# Patient Record
Sex: Female | Born: 1990 | Race: Black or African American | Hispanic: No | Marital: Single | Smoking: Former smoker
Health system: Southern US, Community
[De-identification: ages and names within clinical notes are randomized; demographics above are authoritative.]

## PROBLEM LIST (undated history)

## (undated) ENCOUNTER — Inpatient Hospital Stay (HOSPITAL_COMMUNITY): Payer: Self-pay

## (undated) DIAGNOSIS — Z8619 Personal history of other infectious and parasitic diseases: Secondary | ICD-10-CM

## (undated) DIAGNOSIS — B999 Unspecified infectious disease: Secondary | ICD-10-CM

## (undated) DIAGNOSIS — D573 Sickle-cell trait: Secondary | ICD-10-CM

## (undated) DIAGNOSIS — O139 Gestational [pregnancy-induced] hypertension without significant proteinuria, unspecified trimester: Secondary | ICD-10-CM

## (undated) DIAGNOSIS — R519 Headache, unspecified: Secondary | ICD-10-CM

## (undated) DIAGNOSIS — IMO0002 Reserved for concepts with insufficient information to code with codable children: Secondary | ICD-10-CM

## (undated) DIAGNOSIS — R51 Headache: Secondary | ICD-10-CM

## (undated) DIAGNOSIS — B009 Herpesviral infection, unspecified: Secondary | ICD-10-CM

## (undated) DIAGNOSIS — R87619 Unspecified abnormal cytological findings in specimens from cervix uteri: Secondary | ICD-10-CM

## (undated) HISTORY — DX: Personal history of other infectious and parasitic diseases: Z86.19

## (undated) HISTORY — DX: Unspecified abnormal cytological findings in specimens from cervix uteri: R87.619

## (undated) HISTORY — PX: KNEE SURGERY: SHX244

## (undated) HISTORY — DX: Reserved for concepts with insufficient information to code with codable children: IMO0002

## (undated) HISTORY — DX: Gestational (pregnancy-induced) hypertension without significant proteinuria, unspecified trimester: O13.9

---
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
## 2010-01-04 ENCOUNTER — Emergency Department (HOSPITAL_COMMUNITY): Admission: EM | Admit: 2010-01-04 | Discharge: 2010-01-05 | Payer: Self-pay | Admitting: Emergency Medicine

## 2010-03-16 ENCOUNTER — Emergency Department (HOSPITAL_COMMUNITY): Admission: EM | Admit: 2010-03-16 | Discharge: 2010-03-16 | Payer: Self-pay | Admitting: Emergency Medicine

## 2010-04-06 ENCOUNTER — Emergency Department (HOSPITAL_COMMUNITY): Admission: EM | Admit: 2010-04-06 | Discharge: 2010-04-07 | Payer: Self-pay | Admitting: Emergency Medicine

## 2010-04-26 ENCOUNTER — Ambulatory Visit: Payer: Self-pay | Admitting: Family Medicine

## 2010-04-26 LAB — CONVERTED CEMR LAB: Beta hcg, urine, semiquantitative: NEGATIVE

## 2010-05-05 ENCOUNTER — Inpatient Hospital Stay (HOSPITAL_COMMUNITY): Admission: AD | Admit: 2010-05-05 | Discharge: 2010-05-05 | Payer: Self-pay | Admitting: Obstetrics and Gynecology

## 2010-05-24 ENCOUNTER — Encounter: Payer: Self-pay | Admitting: Family Medicine

## 2010-05-24 ENCOUNTER — Ambulatory Visit: Payer: Self-pay | Admitting: Family Medicine

## 2010-05-24 LAB — CONVERTED CEMR LAB
GC Probe Amp, Genital: NEGATIVE
Whiff Test: NEGATIVE

## 2010-05-25 ENCOUNTER — Telehealth (INDEPENDENT_AMBULATORY_CARE_PROVIDER_SITE_OTHER): Payer: Self-pay | Admitting: Family Medicine

## 2010-05-27 ENCOUNTER — Ambulatory Visit: Payer: Self-pay | Admitting: Family Medicine

## 2010-06-13 ENCOUNTER — Emergency Department (HOSPITAL_COMMUNITY)
Admission: EM | Admit: 2010-06-13 | Discharge: 2010-06-14 | Payer: Self-pay | Source: Home / Self Care | Admitting: Emergency Medicine

## 2010-06-16 ENCOUNTER — Telehealth: Payer: Self-pay | Admitting: Family Medicine

## 2010-06-21 ENCOUNTER — Ambulatory Visit: Payer: Self-pay | Admitting: Family Medicine

## 2010-06-21 LAB — CONVERTED CEMR LAB
Beta hcg, urine, semiquantitative: POSITIVE
Bilirubin Urine: NEGATIVE
Blood in Urine, dipstick: NEGATIVE
Glucose, Urine, Semiquant: NEGATIVE
Protein, U semiquant: NEGATIVE
pH: 5.5

## 2010-06-25 ENCOUNTER — Inpatient Hospital Stay (HOSPITAL_COMMUNITY)
Admission: AD | Admit: 2010-06-25 | Discharge: 2010-06-25 | Payer: Self-pay | Source: Home / Self Care | Attending: Obstetrics & Gynecology | Admitting: Obstetrics & Gynecology

## 2010-06-27 ENCOUNTER — Inpatient Hospital Stay (HOSPITAL_COMMUNITY)
Admission: AD | Admit: 2010-06-27 | Discharge: 2010-06-27 | Payer: Self-pay | Source: Home / Self Care | Attending: Obstetrics and Gynecology | Admitting: Obstetrics and Gynecology

## 2010-06-28 ENCOUNTER — Ambulatory Visit: Payer: Self-pay

## 2010-06-29 ENCOUNTER — Telehealth: Payer: Self-pay | Admitting: Family Medicine

## 2010-06-29 ENCOUNTER — Inpatient Hospital Stay (HOSPITAL_COMMUNITY)
Admission: AD | Admit: 2010-06-29 | Discharge: 2010-06-29 | Payer: Self-pay | Source: Home / Self Care | Attending: Obstetrics & Gynecology | Admitting: Obstetrics & Gynecology

## 2010-06-29 ENCOUNTER — Ambulatory Visit: Payer: Self-pay

## 2010-07-06 ENCOUNTER — Ambulatory Visit: Payer: Self-pay

## 2010-07-14 ENCOUNTER — Ambulatory Visit: Admit: 2010-07-14 | Payer: Self-pay

## 2010-07-19 ENCOUNTER — Ambulatory Visit
Admission: RE | Admit: 2010-07-19 | Discharge: 2010-07-19 | Payer: Self-pay | Source: Home / Self Care | Attending: Family Medicine | Admitting: Family Medicine

## 2010-07-19 ENCOUNTER — Encounter: Payer: Self-pay | Admitting: Family Medicine

## 2010-07-19 LAB — CONVERTED CEMR LAB
Nitrite: NEGATIVE
Specific Gravity, Urine: 1.02
WBC Urine, dipstick: NEGATIVE

## 2010-07-20 ENCOUNTER — Ambulatory Visit
Admission: RE | Admit: 2010-07-20 | Discharge: 2010-07-20 | Payer: Self-pay | Source: Home / Self Care | Attending: Family Medicine | Admitting: Family Medicine

## 2010-07-20 ENCOUNTER — Telehealth: Payer: Self-pay | Admitting: Family Medicine

## 2010-07-20 ENCOUNTER — Encounter: Payer: Self-pay | Admitting: Family Medicine

## 2010-07-20 LAB — CONVERTED CEMR LAB
BUN: 13 mg/dL (ref 6–23)
Calcium: 9.6 mg/dL (ref 8.4–10.5)
Creatinine, Ser: 0.69 mg/dL (ref 0.40–1.20)
Glucose, Bld: 78 mg/dL (ref 70–99)
MCV: 75.5 fL — ABNORMAL LOW (ref 78.0–100.0)
Platelets: 282 10*3/uL (ref 150–400)
Potassium: 4.3 meq/L (ref 3.5–5.3)
TSH: 0.941 microintl units/mL (ref 0.350–4.500)
WBC: 15.5 10*3/uL — ABNORMAL HIGH (ref 4.0–10.5)

## 2010-08-07 ENCOUNTER — Inpatient Hospital Stay (HOSPITAL_COMMUNITY)
Admission: AD | Admit: 2010-08-07 | Discharge: 2010-08-07 | Payer: Self-pay | Source: Home / Self Care | Attending: Obstetrics and Gynecology | Admitting: Obstetrics and Gynecology

## 2010-08-07 LAB — WET PREP, GENITAL
Trich, Wet Prep: NONE SEEN
Yeast Wet Prep HPF POC: NONE SEEN

## 2010-08-07 LAB — URINE MICROSCOPIC-ADD ON

## 2010-08-07 LAB — URINALYSIS, ROUTINE W REFLEX MICROSCOPIC
Bilirubin Urine: NEGATIVE
Hgb urine dipstick: NEGATIVE
Ketones, ur: NEGATIVE mg/dL
Specific Gravity, Urine: >1.03 — ABNORMAL HIGH (ref 1.005–1.030)
Urobilinogen, UA: 0.2 mg/dL (ref 0.0–1.0)

## 2010-08-07 LAB — HCG, QUANTITATIVE, PREGNANCY: hCG, Beta Chain, Quant, S: 12540 m[IU]/mL — ABNORMAL HIGH (ref <5)

## 2010-08-10 ENCOUNTER — Encounter: Payer: Self-pay | Admitting: Family Medicine

## 2010-08-12 NOTE — Progress Notes (Signed)
Summary: triage   Phone Note Call from Patient Call back at [phone]   Caller: Patient Summary of Call: right eye swollen/face hurts/cough Initial call taken by: De Nurse,  August 17, 2009 11:07 AM  Follow-up for Phone Call        started friday. it is not any better. she is going to UC now as we have no more appts left & she does not want to wait until tomorrow Follow-up by: Golden Circle RN,  August 17, 2009 12:22 PM

## 2010-08-12 NOTE — Assessment & Plan Note (Signed)
Summary: decreased hearing and ear pain- acute   Vital Signs:  Patient profile:   20 year old female Weight:      209.8 pounds BMI:     37.30 Temp:     98.7 degrees F oral Pulse rate:   92 / minute Pulse rhythm:   regular BP sitting:   139 / 87  (right arm)  Vitals Entered By: Loralee Pacas CMA (July 15, 2009 8:43 AM) CC: decreased hearing and pain    Primary Care Provider:  Lequita Asal  MD  CC:  decreased hearing and pain.  History of Present Illness: 20yo F c/o decreased hearing b/l and pain.  Decreased hearing/pain: Left ear x 3 days.  Right ear x 1 day.  Denies any trauma, head injury, fevers, tinnitis, ear discharge.  Has had rhinorrhea and congestion recently.  Denies any previous history of cerumen impaction.   Allergies: No Known Drug Allergies  Past History:  Past Medical History: Last updated: 03/03/2008 G1P1001 (NSVD 03/03/08, Reather Littler, no complications) fungal vaginitis 11/03, 7/07 Recurrent Chlamydia during pregnancy ' 08  Past Surgical History: Last updated: 09/07/2006 adenoidectomy - 04/11/1999  Review of Systems       Denies any trauma, head injury, fevers, tinnitis, ear discharge.  Has had rhinorrhea and congestion recently.  Physical Exam  General:  VS reviewed.  Obese, well appearing, NAD Head:  no sinus ttp atraumatic.   Eyes:  no injected conjunctiva Ears:  gross hearing intact canal clear mild erythema surrounding TM b/l no bulging, drainage, or pus Nose:  no discharge Mouth:  moist mucus membranes Neurologic:  abnl weber test- louder on the left ear   Impression & Recommendations:  Problem # 1:  DECREASED HEARING (ICD-389.9) Assessment New L ear x 3 days with abnl weber test.  Low suspicion for acoustic neuroma.  Pt discussed with Dr. Swaziland.  Suspect that this could be related to recent URI with congestion and rhinorrhea.  Although I could not see anything behind the ear drum, there could possibly be fluid present  causing her symptoms.  Plan to treat with saline nasal spray and ibuprofen to help with some mild inflammation surrounding the TM.  No obvious signs of infection both on exam and hx.  Will reassess in 1 week.  At that time, will recheck audiometry and possibly start on nasal steroid to open up eustachian tubes.  If no improvement after that intervention or if symptoms worsening, will send to [facility].   Orders: [facility]- Est  Level 4 ([address]109)  Complete Medication List: 1)  Nuvaring 0.12-0.015 Mg/24hr Ring (Etonogestrel-ethinyl estradiol) .... Insert into vagina x 3 weeks, remove for 1 week, then replace with new - disp 1 device 2)  Ibuprofen 600 Mg Tabs (Ibuprofen) .Marland Kitchen.. 1 tablet by mouth every 6 hours x 3 days then every 6 hours as needed for pain 3)  Saline Nasal Spray 0.65 % Soln (Saline) .Marland Kitchen.. 1 spray each nostril two times a day  Patient Instructions: 1)  Schedule follow up appt with Dr. Burnadette Pop in 1 week to reassess hearing.   2)  I think this is related to inflammation around the ear drum and also to your sinusitis.  I want you to pick up nasal saline spray at the pharmacy.  And take 600mg  of ibuprofen every 6 hours x 3 days and then as needed.  Prescriptions: IBUPROFEN 600 MG TABS (IBUPROFEN) 1 tablet by mouth every 6 hours x 3 days then every 6 hours as needed  for pain  #30 x 0   Entered and Authorized by:   Marisue Ivan  MD   Signed by:   Marisue Ivan  MD on 07/15/2009   Method used:   Electronically to        [facility] [phone]* (retail)       [address]       [place], [place]  [address]       Ph: [phone]       Fax: [phone]   RxID:   [id number]

## 2010-08-12 NOTE — Miscellaneous (Signed)
   Clinical Lists Changes  Problems: Removed problem of DECREASED HEARING (ICD-389.9) Removed problem of CELLULITIS, FACE, LEFT (ICD-682.0) Removed problem of CHLAMYDTRACHOMATIS INFECTION LOWER GU SITES (ICD-099.53) Removed problem of ABDOMINAL PAIN, UNSPECIFIED SITE (ICD-789.00) Removed problem of AMENORRHEA (ICD-626.0) Removed problem of HEADACHE (ICD-784.0) Removed problem of RASH AND OTHER NONSPECIFIC SKIN ERUPTION (ICD-782.1) Removed problem of CONTRACEPTIVE MANAGEMENT (ICD-V25.09) Removed problem of CONTACT OR EXPOSURE TO OTHER VIRAL DISEASES (ICD-V01.79) Removed problem of SCREENING FOR MALIGNANT NEOPLASM OF THE CERVIX (ICD-V76.2) Medications: Removed medication of NUVARING 0.12-0.015 MG/24HR RING (ETONOGESTREL-ETHINYL ESTRADIOL) Insert into vagina x 3 weeks, remove for 1 week, then replace with new - disp 1 device Removed medication of IBUPROFEN 600 MG TABS (IBUPROFEN) 1 tablet by mouth every 6 hours x 3 days then every 6 hours as needed for pain Removed medication of SALINE NASAL SPRAY 0.65 % SOLN (SALINE) 1 spray each nostril two times a day

## 2010-08-12 NOTE — Assessment & Plan Note (Signed)
Summary: f/u miscarriage/pt in pain/eo   Vital Signs:  Patient profile:   20 year old female Weight:      226 pounds Pulse rate:   98 / minute BP sitting:   147 / 98  (right arm)  Vitals Entered By: Arlyss Repress CMA, (July 19, 2010 3:02 PM) CC: f/up miscarriage. [facility] Is Patient Diabetic? No Pain Assessment Patient in pain? no        Primary Care Provider:  . [facility]  CC:  f/up miscarriage. [facility].  History of Present Illness: [name] presents to clinic today to follow up a misscarrage at [facility]. She was around 2-3 weeks in her pregnany. She was managed with serial Bhcgs at [facility]. Last value was 7 on Dec 20th. In interum she has stoped bleeding and feels well. She has been having unprotected sex. She did take a urine pregnancy test a few days ago and it was positive.   Additionally she has urinary frequency and left quadrent lower abdominal pain that is mild.  Frequency is not associated with burning or discharge. No fevers chills or back pain. Feels well otherwise.   Abdominal pain: Mild not associated with BMs or urine. Will occasionally notice it. Not very bothersome.   Current Problems (verified): 1)  Urinary Frequency  (ICD-788.41) 2)  Pregnancy, Multigravida  (ICD-V22.1) 3)  Obesity  (ICD-278.00) 4)  Elevated Blood Pressure Without Diagnosis of Hypertension  (ICD-796.2) 5)  Hemorrhoids Nos, w/o Complications  (ICD-455.6) 6)  Eczema, Atopic Dermatitis  (ICD-691.8)  Current Medications (verified): 1)  Prenatal Vitamins 0.8 Mg Tabs (Prenatal Multivit-Min-Fe-Fa) .Marland Kitchen.. 1 By Mouth Daily For Pregnancy  Allergies (verified): No Known Drug Allergies  Past History:  Past Surgical History: Last updated: 09/07/2006 adenoidectomy - 04/11/1999  Family History: Last updated: 01/29/2008 brother - asthma,  mother - asthma,  sisters - asthma  Social History: Last updated: 01/07/2009 Teen mom Arnell Asal), lives with grandmother, [name] lives in [place].  3 siblings - 2  sisters (kristine and charity) and 1 brother Probation officer. Mother dx with HIV 10/06, pt doesn't know; sexually active since age 44; Recurrent Chlamydia with pregnancy '08 treated for Chlamydia 10/2008  Risk Factors: Smoking Status: quit (05/27/2010)  Past Medical History: G2P1011 (NSVD 03/03/08, Reather Littler, no complications) fungal vaginitis 11/03, 7/07 Recurrent Chlamydia during pregnancy ' 08  Review of Systems       The patient complains of abdominal pain.  The patient denies anorexia, fever, weight loss, chest pain, syncope, hematochezia, severe indigestion/heartburn, genital sores, unusual weight change, abnormal bleeding, and enlarged lymph nodes.    Physical Exam  General:  NAD, obese, Vital signs noted pleasant Mouth:  MMM Lungs:  normal WOB, CTABL Heart:  Normal rate and regular rhythm. S1 and S2 normal without gallop, murmur, click, rub or other extra sounds. Abdomen:  Non distended, NABS, soft, no guarding or rebound.  Very mildly TTP in left lower quadrent.  Extremities:  non edemetus BL LE   Impression & Recommendations:  Problem # 1:  PREGNANCY, MULTIGRAVIDA (ICD-V22.1) Based on home pregnancy test. I am not sure if this home test is a reflection of a lingering BHCG from previous pregnancy or a new pregnancy.  I will obtain a blood quantative BHCG and follow the triend. If elevated will assume pregnancy and offer prenatal care vs abortion.  Will follow up via phone when test back.  New number is [phone]  Orders: Miscellaneous Lab Charge-[facility] [phone]) [facility]- Est Level  3 [phone])  Problem # 2:  URINARY FREQUENCY (ICD-788.41) Assessment: New UA is clean today. Will follow up via phone.  Orders: Urinalysis-[facility] (00000) [facility]- Est Level  3 (91478)  Complete Medication List: 1)  Prenatal Vitamins 0.8 Mg Tabs (Prenatal multivit-min-fe-fa) .Marland Kitchen.. 1 by mouth daily for pregnancy  Patient Instructions: 1)  Thank you for seeing me today.   Orders Added: 1)  Urinalysis-[facility]  [00000] 2)  Miscellaneous Lab Charge-[facility] [99999] 3)  [facility]- Est Level  3 [99213]    Laboratory Results   Urine Tests  Date/Time Received: July 19, 2010 3:21 PM  Date/Time Reported: July 19, 2010 3:37 PM   Routine Urinalysis   Color: yellow Appearance: Clear Glucose: negative   (Normal Range: Negative) Bilirubin: negative   (Normal Range: Negative) Ketone: negative   (Normal Range: Negative) Spec. Gravity: 1.020   (Normal Range: 1.003-1.035) Blood: negative   (Normal Range: Negative) pH: 5.5   (Normal Range: 5.0-8.0) Protein: negative   (Normal Range: Negative) Urobilinogen: 0.2   (Normal Range: 0-1) Nitrite: negative   (Normal Range: Negative) Leukocyte Esterace: negative   (Normal Range: Negative)    Comments: ...............test performed by......Marland KitchenBonnie A. Swaziland, MLS (ASCP)cm

## 2010-08-12 NOTE — Procedures (Signed)
Summary: decreased hearing and ear pain- acute      20db HL: Left  500 hz: No Response 1000 hz: No Response 2000 hz: 20db 4000 hz: 20db Right  500 hz: No Response 1000 hz: No Response 2000 hz: 20db 4000 hz: 20db    Allergies: No Known Drug Allergies   Complete Medication List: 1)  Nuvaring 0.12-0.015 Mg/24hr Ring (Etonogestrel-ethinyl estradiol) .... Insert into vagina x 3 weeks, remove for 1 week, then replace with new - disp 1 device 2)  Ibuprofen 600 Mg Tabs (Ibuprofen) .Marland Kitchen.. 1 tablet by mouth every 6 hours x 3 days then every 6 hours as needed for pain 3)  Saline Nasal Spray 0.65 % Soln (Saline) .Marland Kitchen.. 1 spray each nostril two times a day

## 2010-08-12 NOTE — Progress Notes (Signed)
Summary: Triage   Phone Note Call from Patient Call back at Home Phone [phone]   Reason for Call: Talk to Nurse Summary of Call: feeling chest pain, cant breathe good, was seen yesterday for other issues.  Initial call taken by: Knox Royalty,  July 20, 2010 1:34 PM  Follow-up for Phone Call        admits to anxiety. asked her to come in now. has appt at 2:30. we can get vitals & talk further. told her we will try to get her seen asap. she agreed pain is in center of chest. states deep breathing helps Follow-up by: Golden Circle RN,  July 20, 2010 1:47 PM

## 2010-08-12 NOTE — Assessment & Plan Note (Signed)
Summary: chest pressure/sob/bmc   Vital Signs:  Patient profile:   20 year old female Height:      63 inches Weight:      225 pounds BMI:     40.00 Temp:     98.1 degrees F oral Pulse rate:   93 / minute BP sitting:   128 / 89  (left arm) Cuff size:   large  Vitals Entered By: Tessie Fass CMA (July 20, 2010 2:49 PM) CC: chest pressure, SOB Is Patient Diabetic? No Pain Assessment Patient in pain? yes     Location: lower back, abdomen Intensity: 8   Primary Care Provider:  Barnabas Lister  MD  CC:  chest pressure and SOB.  History of Present Illness: 20 y/o F with obesity and elevated BP without Dx of HTN woke up with chest pressure this morning.  She is an occasional smoker x 1 yr.  Smokes about 1 day per month.  She is also worried about her miscarriage.  She has been trying to get pregnant and had two home pregnancy tests with Positive result and one with a negative result.  She was seen in clinic yesterday for bHCG test, but has not received result yet.    CHEST PAIN Location: substernal Description: constant chest pain that feels like pressure and something heavy sitting on her chest Onset: Pt woke up this morning with feeling of heaviness in her chest.  She thinks that it has improved mildly, but is still present.  This pain is graded at an 8 out of 10.  She has not taken tylenol or aspirin.  No radiation to neck, jaw, or arm.  No previous episodes of chest pain.   Symptoms Trauma: no Nausea/vomiting: +nausea Diaphoresis: yes, feels hot x 2 days Shortness of breath: yes, feels like she has to catch her breath or take a deep breath Cough: no Edema: no, but thighs are painful Orthopnea: no Syncope: no Indigestion: no  Red Flags Worse with exertion: yes Recent Immobility: no Cancer history: no Tearing/radiation to back: no    Habits & Providers  Alcohol-Tobacco-Diet     Tobacco Status: current     Tobacco Counseling: to quit use of tobacco products   Cigarette Packs/Day: <0.25  Current Medications (verified): 1)  Prenatal Vitamins 0.8 Mg Tabs (Prenatal Multivit-Min-Fe-Fa) .Marland Kitchen.. 1 By Mouth Daily For Pregnancy  Allergies (verified): No Known Drug Allergies  Social History: Smoking Status:  current Packs/Day:  <0.25  Review of Systems       per hpi   Physical Exam  General:  Well-developed,well-nourished,in no acute distress; alert,appropriate and cooperative throughout examination. Vitals reviewed.  Head:  normocephalic and atraumatic.   Mouth:  Oral mucosa and oropharynx without lesions or exudates.  Teeth in good repair. Lungs:  Normal respiratory effort, chest expands symmetrically. Lungs are clear to auscultation, no crackles or wheezes. Heart:  Normal rate and regular rhythm. S1 and S2 normal without gallop, murmur, click, rub or other extra sounds.  No bruit  Abdomen:  Bowel sounds positive,abdomen soft and non-tender without masses, organomegaly or hernias noted. obese.  Msk:  No deformity or scoliosis noted of thoracic or lumbar spine.   Pulses:  R and L carotid,radial,femoral,dorsalis pedis and posterior tibial pulses are full and equal bilaterally Extremities:  No clubbing, cyanosis, edema, or deformity noted with normal full range of motion of all joints.   Neurologic:  alert & oriented X3 and cranial nerves II-XII intact.   Cervical Nodes:  No  lymphadenopathy noted   Impression & Recommendations:  Problem # 1:  CHEST PAIN (ICD-786.50) Assessment New Complaint of chest pain in an obese 20 y/o F with no other risk factors.  EKG showed NSR.  Likely not cardiac in etiolgoy.  May be from anxiety regarding her pregnancy.  Pt is obese and has had HR in 90s.  This may also be from deconditioning, but may be from anemia or thyroid dysfunction.  Will check these labs today.   EKG: NSR, HR 84, No abnormal ST-wave changes  Orders: Basic Met-[facility] (54098-11914) CBC-[facility] (78295) TSH-[facility] (62130-86578) EKG- [facility] (EKG) [facility]- Est  Level  3 (46962)  Problem # 2:  DYSPNEA (ICD-786.05) Assessment: New See above.  Dyspnea may be from deconditioning or anxiety/stress regarding SAB.  Pt morbidly obese and endorses daytime sleepiness, but does not have symptoms of snoring or losing breath in sleep, so likely not OSA.   Orders: TSH-[facility] ([id number]) [facility]- Est Level  3 (01027)  Problem # 3:  PREGNANCY, MULTIGRAVIDA (ICD-V22.1) Assessment: Comment Only Gave result of bHCG, likely not pregnant given that it is <2.  Pt took news well, although she is disappointed.    Complete Medication List: 1)  Prenatal Vitamins 0.8 Mg Tabs (Prenatal multivit-min-fe-fa) .Marland Kitchen.. 1 by mouth daily for pregnancy  Patient Instructions: 1)  Please schedule a follow-up appointment with Dr Tye Savoy to follow up these labs and ways to get healthy. 2)  Your EKG today is normal.  This is very reassuring. 3)  There are many reasons for your symptoms, and it may be that you were stress or anxious or deconditioned.   4)  Tobacco is very bad for your health and your loved ones ! You should stop smoking !  5)  Stop smoking tips: Choose a quit date. Cut down before the quit date. Decide what you will do as a substitute when you feel the urge to smoke(gum, toothpick, exercise).    Orders Added: 1)  Basic Met-[facility] [25366-44034] 2)  CBC-[facility] [85027] 3)  TSH-[facility] [74259-56387] 4)  EKG- [facility] [EKG] 5)  [facility]- Est Level  3 [56433]

## 2010-08-12 NOTE — Assessment & Plan Note (Signed)
Summary: uri x 1 wk/bmc   Vital Signs:  Patient profile:   20 year old female LMP:     03/15/2010 Weight:      221 pounds Temp:     98 degrees F oral Pulse rate:   90 / minute BP sitting:   136 / 85  (left arm) Cuff size:   large  Vitals Entered By: Loralee Pacas CMA (April 26, 2010 3:54 PM) Comments pt stated that she vomitted yesterday and it looked like it had blood in it.  she has been coughing alot which has caused her chest to hurt, no SOB. sharp pains in her stomach, pain eases somewhat when she goes to bed. she is now experiencing body aches LMP (date): 03/15/2010     Enter LMP: 03/15/2010 Last PAP Result NEGATIVE FOR INTRAEPITHELIAL LESIONS OR MALIGNANCY.   Primary Care Provider:  . [facility]   History of Present Illness: 20 yo here for 1 week of non productive cough, rhinorrhea, mild body aches, sore throat.  Had post-tussive emesis- thought she saw blood.  had not eaten red food prior.  no ear pain, fever.  contraception:  ran out of nuva ring, did not go back to refill prescription.  period is late this month.  Had done well on nuva ring, woudl like refill  Habits & Providers  Alcohol-Tobacco-Diet     Tobacco Status: never  Current Medications (verified): 1)  Nuvaring 0.12-0.015 Mg/24hr Ring (Etonogestrel-Ethinyl Estradiol) .... Insert One Ring For 3 Weeks, Remove X 1 Weeks, Then Replace.  Dispense 1 Month Supply  Allergies (verified): No Known Drug Allergies PMH-FH-SH reviewed for relevance  Review of Systems      See HPI  Physical Exam  General:  Well-developed,well-nourished,in no acute distress; alert,appropriate and cooperative throughout examination Nose:  Clear without Rhinorrhea Lungs:  Normal respiratory effort, chest expands symmetrically. Lungs are clear to auscultation, no crackles or wheezes. Heart:  Normal rate and regular rhythm. S1 and S2 normal without gallop, murmur, click, rub or other extra sounds. Abdomen:  Bowel sounds  positive,abdomen soft and non-tender without masses, organomegaly or hernias noted.   Impression & Recommendations:  Problem # 1:  VIRAL URI (ICD-465.9)  advised motrin or aleve for muscle aches.  Told to return if no better in a week, starts to run fevers, or had further episodes of what she thinks is bloody emesis.  Orders: [facility]- Est Level  3 (16109)  Problem # 2:  CONTRACEPTIVE MANAGEMENT (ICD-V25.09)  will check upreg today for late menses.  Will refill nuvaring.  Counseled on condoms always, STD's.  Orders: [facility]- Est Level  3 (60454)  Complete Medication List: 1)  Nuvaring 0.12-0.015 Mg/24hr Ring (Etonogestrel-ethinyl estradiol) .... Insert one ring for 3 weeks, remove x 1 weeks, then replace.  dispense 1 month supply  Other Orders: U Preg-[facility] (09811) Prescriptions: NUVARING 0.12-0.015 MG/24HR RING (ETONOGESTREL-ETHINYL ESTRADIOL) insert one ring for 3 weeks, remove x 1 weeks, then replace.  Dispense 1 month supply  #1 x 5   Entered and Authorized by:   Delbert Harness MD   Signed by:   Delbert Harness MD on 04/26/2010   Method used:   Electronically to        [facility] [address] [phone]* (retail)       [address]       [place], [place]  [address]       Ph: [phone]       Fax: [phone]   RxID:   [id number]  Orders Added: 1)  U Preg-[facility] [81025] 2)  [facility]- Est Level  3 [99213]    Laboratory Results   Urine Tests  Date/Time Received: April 26, 2010 4:45 PM  Date/Time Reported: April 26, 2010 4:52 PM     Urine HCG: negative Comments: ...............test performed by......Marland KitchenBonnie A. Swaziland, MLS (ASCP)cm

## 2010-08-12 NOTE — Assessment & Plan Note (Signed)
Summary: EAR/KH   Allergies: No Known Drug Allergies   Complete Medication List: 1)  Nuvaring 0.12-0.015 Mg/24hr Ring (Etonogestrel-ethinyl estradiol) .... Insert into vagina x 3 weeks, remove for 1 week, then replace with new - disp 1 device  Other Orders: No Charge Patient Arrived (NCPA0) (NCPA0)

## 2010-08-12 NOTE — Assessment & Plan Note (Signed)
Summary: vaginal bleeding frequent/[facility]   Vital Signs:  Patient profile:   20 year old female LMP:     05/23/2010 Height:      63 inches Weight:      220 pounds BMI:     39.11 Temp:     98.4 degrees F oral Pulse rate:   89 / minute BP sitting:   134 / 92  Vitals Entered By: Jimmy Footman, CMA (June 21, 2010 4:09 PM) CC: positive preg test, abdominal pain Is Patient Diabetic? No LMP (date): 05/23/2010 EDC by LMP==> 02/27/2011 EDC 02/27/2011     Enter LMP: 05/23/2010 Last PAP Result NEGATIVE FOR INTRAEPITHELIAL LESIONS OR MALIGNANCY.   Primary Care Provider:  . [facility]  CC:  positive preg test and abdominal pain.  History of Present Illness:   2 weeks ago, had light bleeding for a few hours, sexually active last monday   LMP- November 13th,  occurs once a month, bleeds for 7 days slight thin discharge, not bothersome,   +abd pain feels like menstral cramps that come and go, no dysuria, +nausea, able to eat, no diarrhea  Has not been on Nuva ring for a few months  Sexually active 1 partner Neg STD check in Nov 2011   Current Medications (verified): 1)  Prenatal Vitamins 0.8 Mg Tabs (Prenatal Multivit-Min-Fe-Fa) .Marland Kitchen.. 1 By Mouth Daily For Pregnancy  Allergies (verified): No Known Drug Allergies  Review of Systems       Per HPI  Physical Exam  General:  NAD, obese, Vital signs noted pleasant Mouth:  MMM Lungs:  normal WOB Abdomen:  soft, non-tender, normal bowel sounds, no distention, and no masses.  no suprapubic pressure no CVA tenderness   Impression & Recommendations:  Problem # 1:  PREGNANCY, MULTIGRAVIDA (ICD-V22.1) Assessment New  Positive u preg, pt was very excited and suprised about pregnancy, felt safe telling her grandmother and [name]. Of note not using Nuva Ring Given script for PNV if pt can not tolerate, send Folic Acid tabs or try flinestones,  Patient to set up new OB appt. Since discharge not bothersone will not treat  for BV Orders: [facility]- Est Level  3 ([address])  Complete Medication List: 1)  Prenatal Vitamins 0.8 Mg Tabs (Prenatal multivit-min-fe-fa) .Marland Kitchen.. 1 by mouth daily for pregnancy  Other Orders: Urinalysis-[facility] (00000) U Preg-[facility] (60454)  Patient Instructions: 1)  You have a positive pregnancy test  2)  Call to schedule a New OB appt 3)  Congratulations on your new pregnancy! 4)  If you have any concerns please feel free to call. 5)  If you have severe abominal pain or bleeding please go to [facility] to be evaluated 6)  Please take your vitamins as prescribed and drink plenty of water 7)    Prescriptions: PRENATAL VITAMINS 0.8 MG TABS (PRENATAL MULTIVIT-MIN-FE-FA) 1 by mouth daily for pregnancy  #30 x 12   Entered and Authorized by:   Milinda Antis MD   Signed by:   Milinda Antis MD on 06/21/2010   Method used:   Electronically to        [facility] [address] [address] [phone]* (retail)       [address]       [place], [place]  [address]       Ph: [phone]       Fax: [phone]   RxID:   [id number]    Orders Added: 1)  Urinalysis-[facility] [00000] 2)  U Preg-[facility] [81025] 3)  [facility]- Est Level  3 [04540]    Laboratory Results   Urine Tests  Date/Time Received: June 21, 2010 4:14 PM  Date/Time Reported: June 21, 2010 4:27 PM   Routine Urinalysis   Color: yellow Appearance: Clear Glucose: negative   (Normal Range: Negative) Bilirubin: negative   (Normal Range: Negative) Ketone: negative   (Normal Range: Negative) Spec. Gravity: 1.025   (Normal Range: 1.003-1.035) Blood: negative   (Normal Range: Negative) pH: 5.5   (Normal Range: 5.0-8.0) Protein: negative   (Normal Range: Negative) Urobilinogen: 0.2   (Normal Range: 0-1) Nitrite: negative   (Normal Range: Negative) Leukocyte Esterace: trace   (Normal Range: Negative)  Urine Microscopic WBC/HPF: 1-5 Bacteria/HPF: 3+ cocci Epithelial/HPF: 10-20  Other: several clue cells    Urine HCG: positive Comments:  ...............test performed by......Marland KitchenBonnie A. [place], MLS (ASCP)cm       Flowsheet View for Follow-up Visit    Estimated weeks of       gestation:     4 1/7    Weight:     220    Blood pressure:   134 / 92    Urine protein:       negative    Urine glucose:    negative    Urine nitrite:     negative  Prenatal Visit EDC Confirmation:    New working [facility]: 02/27/2011    Last menses onset (LMP) date: 05/23/2010    EDC by LMP: 02/27/2011    Flowsheet View for Follow-up Visit    Estimated weeks of       gestation:     4 1/7    Weight:     220    Blood pressure:   134 / 92    Urine Protein:     negative    Urine Glucose:   negative    Urine Nitrite:     negative

## 2010-08-12 NOTE — Progress Notes (Signed)
Summary: re: labs/TS   ---- Converted from flag ---- ---- 05/25/2010 7:45 AM, Delbert Harness MD wrote: would you please call pt and let her  kn ow gnorrhea, chlamydia, syphilis, and hiv wee negative.  tell her i sent in flagyl to her pharmacy to treat BV vaginal discharge-  not an STD ------------------------------ called pt x 2 ... number busy .Arlyss Repress CMA,  May 25, 2010 6:02 PM  Phone Note Outgoing Call Call back at [phone]   Call placed by: Jimmy Footman, CMA,  May 26, 2010 9:02 AM Call placed to: Patient Summary of Call: lvm for pt to call [facility]ck to give results  Follow-up for Phone Call        gave message to patient.  was appreciative Follow-up by: De Nurse,  May 27, 2010 8:33 AM

## 2010-08-12 NOTE — Progress Notes (Signed)
Summary: phn msg  I will try calling patient myself when I back from vacation.  Please let me know if there is a better number to reach her.  If not, I will send a letter.  Thanks.   Phone Note Call from Patient Call back at [phone]   Caller: Patient Summary of Call: has been going to [facility] for bleeding and is wondering if they would tell her if she is having a miscarriage. Initial call taken by: De Nurse,  June 29, 2010 2:24 PM  Follow-up for Phone Call        attempted calling back but message states " person you are calling has a voice mailbox that has not been set up. " Follow-up by: Theresia Lo RN,  June 29, 2010 2:57 PM  Additional Follow-up for Phone Call Additional follow up Details #1::        patient calls back stating she is currently at [facility] waiting to be seen. they have recently done labs on her and her pregnancy test continues to be positive she states. advised while she is there to ask questions  that she has about a possible miscarriage. will forward message to Dr. d Lawson Radar Additional Follow-up by: Theresia Lo RN,  June 29, 2010 3:23 PM

## 2010-08-12 NOTE — Assessment & Plan Note (Signed)
Summary: std?,df   Vital Signs:  Patient profile:   20 year old female Weight:      222.9 pounds BMI:     39.63 Temp:     98.8 degrees F oral Pulse rate:   91 / minute BP sitting:   132 / 86  (left arm) Cuff size:   large  Vitals Entered By: Jimmy Footman, CMA (May 24, 2010 3:25 PM) CC: discharge x3 days Is Patient Diabetic? No Pain Assessment Patient in pain? no        Primary Care Provider:  . [facility]  CC:  discharge x3 days.  History of Present Illness: 20 yo here for 3 day history of vaginal discharge.  -Thin, white, no odor. - no dysuria, fever, pelvic pain, n/v.  Is currenlty on her menses.  Had unprotected intercourse. - history of chlamydia nfection in the past - nuva ring for conrtaception  Habits & Providers  Alcohol-Tobacco-Diet     Tobacco Status: quit  Current Medications (verified): 1)  Nuvaring 0.12-0.015 Mg/24hr Ring (Etonogestrel-Ethinyl Estradiol) .... Insert One Ring For 3 Weeks, Remove X 1 Weeks, Then Replace.  Dispense 1 Month Supply  Allergies: No Known Drug Allergies PMH-FH-SH reviewed for relevance  Social History: Smoking Status:  quit  Review of Systems      See HPI  Physical Exam  General:  Well-developed,well-nourished,in no acute distress; alert,appropriate and cooperative throughout examination Genitalia:  Pelvic Exam:        External: normal female genitalia without lesions or masses        Vagina: normal without lesions or masses        Cervix: normal without lesions or masses.  bleeding from cervical os.        Adnexa: normal bimanual exam without masses or fullness        Uterus: normal by palpation        Pap smear: not performed   Impression & Recommendations:  Problem # 1:  UNSPECIFIED VAGINITIS AND VULVOVAGINITIS (ICD-616.10)  Will await results before treatment.  Given risk factors will check HIV and RPR today  Orders: GC/Chlamydia-[facility] (87591/87491) Wet Prep- [facility] (11914) [facility]- Est Level  3  (78295)  Her updated medication list for this problem includes:    Metronidazole 500 Mg Tabs (Metronidazole) .Marland Kitchen... Take one tablet twice a day for 7 days  Problem # 2:  VAGINITIS, BACTERIAL (ICD-616.10)  Will treat BV once results of GC/Chlamyida back.  Orders: [facility]- Est Level  3 (62130)  Her updated medication list for this problem includes:    Metronidazole 500 Mg Tabs (Metronidazole) .Marland Kitchen... Take one tablet twice a day for 7 days  Complete Medication List: 1)  Nuvaring 0.12-0.015 Mg/24hr Ring (Etonogestrel-ethinyl estradiol) .... Insert one ring for 3 weeks, remove x 1 weeks, then replace.  dispense 1 month supply 2)  Metronidazole 500 Mg Tabs (Metronidazole) .... Take one tablet twice a day for 7 days  Other Orders: HIV-[facility] ([id number]) RPR-[facility] [phone])  Patient Instructions: 1)  use condoms always 2)  I will send you letetr with normal results, otherwise I will call Prescriptions: METRONIDAZOLE 500 MG TABS (METRONIDAZOLE) take one tablet twice a day for 7 days  #14 x 0   Entered and Authorized by:   Delbert Harness MD   Signed by:   Delbert Harness MD on 05/25/2010   Method used:   Electronically to        [facility] [address] [phone]* (retail)       [phone]       [place], [place]  [address]       Ph: [phone]       Fax: [phone]   RxID:   [id number]    Orders Added: 1)  GC/Chlamydia-[facility] [87591/87491] 2)  Wet Prep- [facility] [87210] 3)  HIV-[facility] [84132-44010] 4)  RPR-[facility] [27253-66440] 5)  [facility]- Est Level  3 [34742]    Laboratory Results   Urine Tests  Date/Time Received:  Date/Time Reported:     Date/Time Received: May 24, 2010 3:37 PM  Date/Time Reported: May 24, 2010 3:49 PM   Wet Carbon Source: vag WBC/hpf: 1-5 Bacteria/hpf: 2+  Cocci Clue cells/hpf: few  Negative whiff Yeast/hpf: none Trichomonas/hpf: none Comments: loaded with RBC's ...............test performed by......Marland KitchenBonnie A. [place], MLS (ASCP)cm

## 2010-08-12 NOTE — Progress Notes (Signed)
Summary: phn msg  I agree with this plan.  If a period is skipped or if she is having fever or signs of UTI, patient may need to come in for evaluation.  Phone Note Call from Patient Call back at [phone]   Caller: Patient Summary of Call: pt has a question about her periods Initial call taken by: De Nurse,  June 16, 2010 3:41 PM  Follow-up for Phone Call        patient states  her periods are irregular anyway. her last period was 05/23/2010. today she had some bleeding for 2 hours very light, not even requiring a panty liner. then the bleeding just stopped. this is the first time this has happened.  advised patient to just watch pattern for now. If regular period is not coming as expected or if episodes such as this continue to let us know. will send message to MD for further suggestions.  Follow-up by: Theresia Lo RN,  June 16, 2010 3:49 PM

## 2010-08-12 NOTE — Assessment & Plan Note (Signed)
Summary: discuss infertility/eo  [id number]  Vital Signs:  Patient profile:   20 year old female Weight:      218 pounds Pulse rate:   82 / minute BP sitting:   140 / 90  (right arm)  Vitals Entered By: Arlyss Repress CMA, (May 27, 2010 1:49 PM) CC: discuss fertility. pt is not using birth control. has 47 year old son Is Patient Diabetic? No Pain Assessment Patient in pain? no        Primary Shevy Yaney:  . [facility]  CC:  discuss fertility. pt is not using birth control. has 85 year old son.  History of Present Illness: 20 year old female wants to discuss infertility.  She removed Nuvaring last month b/c she thought she wanted to have another baby.  She has one 20  yo son.  Pt is concerned that she might be infertile.  While she was pregnant with her first child, she was treated for chlamydia.  Nurse told her that recurrent chlamydia infections can damage her "insides" and cause infertility.  Pt wants to know if this is true.  Pt has changed her mind and no longer wants to try to get pregnant.  She will be using Nuvaring shortly.  ROS: Positive for vaginal discharge.  Denies fever, pelvic or abdominal pain, spotting/bleeding, burning, or foul odor.  Preventive Screening-Counseling & Management  Alcohol-Tobacco     Smoking Status: quit  Current Medications (verified): 1)  Nuvaring 0.12-0.015 Mg/24hr Ring (Etonogestrel-Ethinyl Estradiol) .... Insert One Ring For 3 Weeks, Remove X 1 Weeks, Then Replace.  Dispense 1 Month Supply 2)  Metronidazole 500 Mg Tabs (Metronidazole) .... Take One Tablet Twice A Day For 7 Days  Allergies (verified): No Known Drug Allergies  Review of Systems       per HPI  Physical Exam  General:  normal appearance, healthy appearing, and obese.   Psych:  anxious, but alert and cooperative   Impression & Recommendations:  Problem # 1:  CONTRACEPTIVE MANAGEMENT (ICD-V25.09) Reassured patient that she does not meet the criteria for  infertility.  An infertility work-up will be considered if she cannot conceive after 12 months (after removing nuvaring).  Because she has a child, is young, and has been treated for chlaymdia, it is unlikely that her tubes are damaged.  Advised pt to try to have intercourse every other day, 5 days before and after ovulation.  However, pt says her cyles are irregular.  She no longer wants to get pregnant and will be using Nuvaring again shortly.  Advised pt to call MD if she cannot conceive after 12 months of trying.  She seemed reassured, but disappointed.    Orders: [facility]- Est Level  3 (62831)   Orders Added: 1)  [facility]- Est Level  3 [51761]

## 2010-08-26 NOTE — Miscellaneous (Signed)
Summary: ROI  ROI   Imported By: De Nurse 08/10/2010 19:18:52  _____________________________________________________________________  External Attachment:    Type:   Image     Comment:   External Document

## 2010-08-27 LAB — HEPATITIS B SURFACE ANTIGEN: Hepatitis B Surface Ag: NEGATIVE

## 2010-09-20 LAB — CBC
HCT: 36.8 % (ref 36.0–46.0)
MCV: 74.5 fL — ABNORMAL LOW (ref 78.0–100.0)
RBC: 4.94 MIL/uL (ref 3.87–5.11)
RDW: 14.3 % (ref 11.5–15.5)
WBC: 10.5 10*3/uL (ref 4.0–10.5)

## 2010-09-20 LAB — URINALYSIS, ROUTINE W REFLEX MICROSCOPIC
Bilirubin Urine: NEGATIVE
Nitrite: NEGATIVE
Specific Gravity, Urine: 1.025 (ref 1.005–1.030)
Urobilinogen, UA: 0.2 mg/dL (ref 0.0–1.0)

## 2010-09-20 LAB — WET PREP, GENITAL
Trich, Wet Prep: NONE SEEN
Yeast Wet Prep HPF POC: NONE SEEN

## 2010-09-20 LAB — URINE MICROSCOPIC-ADD ON

## 2010-09-20 LAB — GC/CHLAMYDIA PROBE AMP, URINE: Chlamydia, Swab/Urine, PCR: NEGATIVE

## 2010-09-26 LAB — URINALYSIS, ROUTINE W REFLEX MICROSCOPIC
Glucose, UA: NEGATIVE mg/dL
Hgb urine dipstick: NEGATIVE
Specific Gravity, Urine: 1.017 (ref 1.005–1.030)
pH: 6 (ref 5.0–8.0)

## 2010-09-26 LAB — GC/CHLAMYDIA PROBE AMP, GENITAL
Chlamydia, DNA Probe: NEGATIVE
GC Probe Amp, Genital: NEGATIVE

## 2010-09-26 LAB — URINE MICROSCOPIC-ADD ON

## 2010-09-26 LAB — WET PREP, GENITAL

## 2010-09-29 LAB — URINALYSIS, ROUTINE W REFLEX MICROSCOPIC
Hgb urine dipstick: NEGATIVE
Protein, ur: NEGATIVE mg/dL
Urobilinogen, UA: 0.2 mg/dL (ref 0.0–1.0)

## 2010-09-29 LAB — URINE MICROSCOPIC-ADD ON

## 2010-10-17 LAB — WET PREP, GENITAL
Clue Cells Wet Prep HPF POC: NONE SEEN
Trich, Wet Prep: NONE SEEN

## 2010-10-17 LAB — URINALYSIS, ROUTINE W REFLEX MICROSCOPIC
Glucose, UA: NEGATIVE mg/dL
Ketones, ur: NEGATIVE mg/dL
pH: 6 (ref 5.0–8.0)

## 2010-10-17 LAB — GC/CHLAMYDIA PROBE AMP, GENITAL: Chlamydia, DNA Probe: NEGATIVE

## 2010-10-17 LAB — POCT PREGNANCY, URINE: Preg Test, Ur: NEGATIVE

## 2010-10-19 LAB — URINALYSIS, ROUTINE W REFLEX MICROSCOPIC
Nitrite: NEGATIVE
Specific Gravity, Urine: 1.02 (ref 1.005–1.030)
pH: 7 (ref 5.0–8.0)

## 2010-10-19 LAB — GLUCOSE, CAPILLARY

## 2010-10-19 LAB — URINE MICROSCOPIC-ADD ON

## 2010-11-23 NOTE — Group Therapy Note (Signed)
NAMENORISSA, BARTEE NO.:  [id number]   MEDICAL RECORD NO.:  [id number]          PATIENT TYPE:  WOC   LOCATION:  [facility]                   FACILITY:  [facility]   PHYSICIAN:  Argentina Donovan, MD        DATE OF BIRTH:  06-13-1991   DATE OF SERVICE:  03/26/2008                                  CLINIC NOTE   The patient 20 year old gravida 1, para 1-0-0-1 with a child 47-year-old  who was seen in July in the MAU because of heavy vaginal bleeding and  very irregular periods.  She had been on Depo-Provera before, was placed  on oral contraceptives and she says she has been fine since that time.  Her hemoglobin has been fine.  She has no complaints at this time and is  in for annual Pap smear.   The external genitalia is normal.  BUS within normal limits.  Vagina is  clean, well rugated.  Cervix is clean, parous.  The uterus could not be  outlined nor could the adnexa.  She had an ultrasound in July that  showed a normal pelvis.  She is on birth control pills and we are not  sure which one she is on.  When she gets home she is going to call with  the name of the pills and the pharmacy fax number so we can fax  prescription in for a year.   IMPRESSION:  Normal gynecological examination.           ______________________________  Argentina Donovan, MD     PR/MEDQ  D:  03/26/2008  T:  03/27/2008  Job:  [id number]

## 2010-12-12 ENCOUNTER — Inpatient Hospital Stay (HOSPITAL_COMMUNITY)
Admission: AD | Admit: 2010-12-12 | Discharge: 2010-12-12 | Disposition: A | Discharge status: Home / Self Care | Payer: Medicaid Other | Source: Ambulatory Visit | Attending: Obstetrics and Gynecology | Admitting: Obstetrics and Gynecology

## 2010-12-12 DIAGNOSIS — O99891 Other specified diseases and conditions complicating pregnancy: Secondary | ICD-10-CM | POA: Insufficient documentation

## 2010-12-12 DIAGNOSIS — K219 Gastro-esophageal reflux disease without esophagitis: Secondary | ICD-10-CM

## 2010-12-12 DIAGNOSIS — O9989 Other specified diseases and conditions complicating pregnancy, childbirth and the puerperium: Secondary | ICD-10-CM

## 2010-12-12 LAB — URINALYSIS, ROUTINE W REFLEX MICROSCOPIC
Glucose, UA: NEGATIVE mg/dL
Specific Gravity, Urine: 1.02 (ref 1.005–1.030)
Urobilinogen, UA: 2 mg/dL — ABNORMAL HIGH (ref 0.0–1.0)

## 2010-12-12 LAB — URINE MICROSCOPIC-ADD ON

## 2010-12-12 LAB — CBC
MCH: 24.7 pg — ABNORMAL LOW (ref 26.0–34.0)
MCV: 72.6 fL — ABNORMAL LOW (ref 78.0–100.0)
Platelets: 167 10*3/uL (ref 150–400)
RDW: 14.3 % (ref 11.5–15.5)

## 2010-12-12 LAB — COMPREHENSIVE METABOLIC PANEL
Albumin: 2.9 g/dL — ABNORMAL LOW (ref 3.5–5.2)
BUN: 5 mg/dL — ABNORMAL LOW (ref 6–23)
Creatinine, Ser: 0.56 mg/dL (ref 0.4–1.2)
Glucose, Bld: 90 mg/dL (ref 70–99)
Total Bilirubin: 0.9 mg/dL (ref 0.3–1.2)
Total Protein: 6.6 g/dL (ref 6.0–8.3)

## 2010-12-13 LAB — URINE CULTURE: Colony Count: >=100000

## 2011-01-19 ENCOUNTER — Inpatient Hospital Stay (HOSPITAL_COMMUNITY)
Admission: AD | Admit: 2011-01-19 | Payer: Medicaid Other | Source: Ambulatory Visit | Admitting: Obstetrics and Gynecology

## 2011-01-19 ENCOUNTER — Inpatient Hospital Stay (HOSPITAL_COMMUNITY)
Admission: AD | Admit: 2011-01-19 | Discharge: 2011-01-19 | DRG: 781 | Disposition: A | Discharge status: Home / Self Care | Payer: Medicaid Other | Source: Ambulatory Visit | Attending: Obstetrics and Gynecology | Admitting: Obstetrics and Gynecology

## 2011-01-19 ENCOUNTER — Encounter (HOSPITAL_COMMUNITY): Payer: Self-pay | Admitting: *Deleted

## 2011-01-19 DIAGNOSIS — R109 Unspecified abdominal pain: Secondary | ICD-10-CM

## 2011-01-19 DIAGNOSIS — O26899 Other specified pregnancy related conditions, unspecified trimester: Secondary | ICD-10-CM

## 2011-01-19 DIAGNOSIS — O99891 Other specified diseases and conditions complicating pregnancy: Principal | ICD-10-CM | POA: Diagnosis present

## 2011-01-19 DIAGNOSIS — O9989 Other specified diseases and conditions complicating pregnancy, childbirth and the puerperium: Secondary | ICD-10-CM

## 2011-01-19 HISTORY — DX: Sickle-cell trait: D57.3

## 2011-01-19 LAB — URINALYSIS, ROUTINE W REFLEX MICROSCOPIC
Bilirubin Urine: NEGATIVE
Glucose, UA: NEGATIVE mg/dL
Hgb urine dipstick: NEGATIVE
Ketones, ur: 15 mg/dL — AB
Leukocytes, UA: NEGATIVE
Nitrite: NEGATIVE
Protein, ur: NEGATIVE mg/dL
Specific Gravity, Urine: 1.025 (ref 1.005–1.030)
Urobilinogen, UA: 0.2 mg/dL (ref 0.0–1.0)
pH: 6 (ref 5.0–8.0)

## 2011-01-19 NOTE — Initial Assessments (Signed)
Pt presents to MAU today stating Dr. Jackelyn Knife told her to come to MAU due to Braxton hicks contractions that started 3 days ago.

## 2011-01-19 NOTE — Progress Notes (Signed)
Pt states, " I have been having sharp pain in my lower abd for three days."

## 2011-01-19 NOTE — ED Provider Notes (Addendum)
History     Chief Complaint  Patient presents with  . Abdominal Pain   Abdominal Pain Associated symptoms include frequency and nausea. Pertinent negatives include no dysuria, fever, hematuria or vomiting.   G2 P1 presents at [redacted]w[redacted]d with complaint of abdominal pain.  She has had the pain for 3 days.  Nausea without vomiting. Denies vaginal bleeding or leaking of fluid.  Tried tylenol without relief. Points to suprapubic area stating "he lays there all the time":.  Denies dysuria but reports frequency.  Called the office at noon and return call wanted her to come to the office but she couldn't come until now--instructed to come here.   Past Medical History  Diagnosis Date  . Sickle cell trait     Past Surgical History  Procedure Date  . Knee surgery     Family History  Problem Relation Age of Onset  . Diabetes Maternal Grandmother     History  Substance Use Topics  . Smoking status: Never Smoker   . Smokeless tobacco: Never Used  . Alcohol Use: No    Allergies: No Known Allergies   (Not in a hospital admission)  Review of Systems  Constitutional: Positive for chills. Negative for fever.  Gastrointestinal: Positive for nausea and abdominal pain. Negative for vomiting.  Genitourinary: Positive for frequency. Negative for dysuria and hematuria.  Musculoskeletal: Positive for back pain.   Physical Exam   Blood pressure 129/83, pulse 103, temperature 99.1 F (37.3 C), temperature source Oral, resp. rate 20, height 5\' 4"  (1.626 m), weight 101.266 kg (223 lb 4 oz).  Physical Exam  Constitutional: She appears well-developed and well-nourished.  GI: Soft. There is no tenderness. There is no CVA tenderness.  Musculoskeletal:       Right shoulder: She exhibits tenderness.   Abdomen is soft,nontender, gravid.   MAU Course  Procedures  MDM UA ordered.UA is negative.  FMS reviewed.  Consulted with Dr. Meisinger==reportedpatient's chief complaint,  VS, FMS, exam and UA  results.  May discharge to home with follow up in the office for further sxs.    Matt Holmes, NP 01/19/11 2232  Matt Holmes, NP 02/12/11 1557  Matt Holmes, NP 02/12/11 1818  Matt Holmes, NP 03/24/11 1322

## 2011-02-26 ENCOUNTER — Encounter (HOSPITAL_COMMUNITY): Payer: Self-pay | Admitting: Obstetrics and Gynecology

## 2011-02-26 ENCOUNTER — Inpatient Hospital Stay (HOSPITAL_COMMUNITY)
Admission: AD | Admit: 2011-02-26 | Discharge: 2011-02-26 | Disposition: A | Discharge status: Home / Self Care | Payer: Medicaid Other | Source: Ambulatory Visit | Attending: Obstetrics and Gynecology | Admitting: Obstetrics and Gynecology

## 2011-02-26 DIAGNOSIS — O99891 Other specified diseases and conditions complicating pregnancy: Secondary | ICD-10-CM | POA: Insufficient documentation

## 2011-02-26 DIAGNOSIS — O26899 Other specified pregnancy related conditions, unspecified trimester: Secondary | ICD-10-CM

## 2011-02-26 DIAGNOSIS — R109 Unspecified abdominal pain: Secondary | ICD-10-CM | POA: Insufficient documentation

## 2011-02-26 LAB — URINALYSIS, ROUTINE W REFLEX MICROSCOPIC
Bilirubin Urine: NEGATIVE
Nitrite: NEGATIVE
Specific Gravity, Urine: 1.02 (ref 1.005–1.030)
pH: 6 (ref 5.0–8.0)

## 2011-02-26 LAB — URINE MICROSCOPIC-ADD ON

## 2011-02-26 MED ORDER — ONDANSETRON HCL 4 MG PO TABS: 4.0000 mg | ORAL_TABLET | Freq: Three times a day (TID) | ORAL | Status: AC | PRN

## 2011-02-26 MED ORDER — HYDROXYZINE HCL 50 MG/ML IM SOLN
50.0000 mg | Freq: Once | INTRAMUSCULAR | Status: AC
  Administered 2011-02-26: 50 mg via INTRAMUSCULAR
  Filled 2011-02-26: qty 1

## 2011-02-26 MED ORDER — CEPHALEXIN 500 MG PO CAPS: 500.0000 mg | ORAL_CAPSULE | Freq: Four times a day (QID) | ORAL | Status: AC

## 2011-02-26 NOTE — Progress Notes (Signed)
EFM D/C PT D/C HOME. E RICE PA 2ND REVEIWER OF STRIP

## 2011-02-26 NOTE — Progress Notes (Signed)
Pt presents to MAU with pain in abdomin, back, and pelvic pain that started 2 days ago. Pt states she has not gained any weight with this preg. And continues to vomit. Pt is a G3P1. No history of pre-term delivery.

## 2011-02-26 NOTE — Progress Notes (Signed)
Saw Dr. Ambrose Mantle two days ago was told if got worse to call.

## 2011-02-26 NOTE — Progress Notes (Signed)
Pain in pelvic and rectum, vomiting x 2 days, contractions.

## 2011-02-26 NOTE — ED Provider Notes (Signed)
History   Pt presents today c/o lower abd pain and pressure. She also c/o N&V and states she has been unable to keep anything on her stomach. She denies vag dc, bleeding, fever, or any other sx at this time. She reports GFM.  Chief Complaint  Patient presents with  . Pelvic Pain  . Emesis  . Contractions   HPI  OB History    Grav Para Term Preterm Abortions TAB SAB Ect Mult Living   3 1 1  0 1 0 1 0 0 1      Past Medical History  Diagnosis Date  . Sickle cell trait     Past Surgical History  Procedure Date  . Knee surgery     Family History  Problem Relation Age of Onset  . Diabetes Maternal Grandmother     History  Substance Use Topics  . Smoking status: Never Smoker   . Smokeless tobacco: Never Used  . Alcohol Use: No    Allergies: No Known Allergies  Prescriptions prior to admission  Medication Sig Dispense Refill  . Prenatal Multivit-Min-Fe-FA (PRENATAL VITAMINS) 0.8 MG tablet Take 1 tablet by mouth daily.        Marland Kitchen PRESCRIPTION MEDICATION Take 1 tablet by mouth daily. Heartburn med, cannot verify, due to rx closed         Review of Systems  Constitutional: Negative for fever and chills.  Cardiovascular: Negative for chest pain.  Gastrointestinal: Positive for nausea, vomiting and abdominal pain. Negative for diarrhea and constipation.  Genitourinary: Negative for dysuria, urgency, frequency, hematuria and flank pain.  Neurological: Negative for dizziness and headaches.  Psychiatric/Behavioral: Negative for depression and suicidal ideas.   Physical Exam   Blood pressure 122/82, pulse 105, temperature 98.1 F (36.7 C), temperature source Oral, resp. rate 16, height 5\' 4"  (1.626 m), weight 22 lb 12.8 oz (10.342 kg).  Physical Exam  Constitutional: She is oriented to person, place, and time. She appears well-developed and well-nourished. No distress.  HENT:  Head: Normocephalic and atraumatic.  Eyes: EOM are normal. Pupils are equal, round, and reactive  to light.  GI: Soft. She exhibits no distension and no mass. There is no tenderness. There is no rebound and no guarding.  Genitourinary: No bleeding around the vagina. No vaginal discharge found.       Cervix is Lg/closed.  Neurological: She is alert and oriented to person, place, and time.  Skin: Skin is warm and dry. She is not diaphoretic.  Psychiatric: She has a normal mood and affect. Her behavior is normal. Judgment and thought content normal.    MAU Course  Procedures  Results for orders placed during the hospital encounter of 02/26/11 (from the past 24 hour(s))  URINALYSIS, ROUTINE W REFLEX MICROSCOPIC     Status: Abnormal   Collection Time   02/26/11 12:33 PM      Component Value Range   Color, Urine YELLOW  YELLOW    Appearance HAZY (*) CLEAR    Specific Gravity, Urine 1.020  1.005 - 1.030    pH 6.0  5.0 - 8.0    Glucose, UA NEGATIVE  NEGATIVE (mg/dL)   Hgb urine dipstick SMALL (*) NEGATIVE    Bilirubin Urine NEGATIVE  NEGATIVE    Ketones, ur NEGATIVE  NEGATIVE (mg/dL)   Protein, ur NEGATIVE  NEGATIVE (mg/dL)   Urobilinogen, UA 1.0  0.0 - 1.0 (mg/dL)   Nitrite NEGATIVE  NEGATIVE    Leukocytes, UA SMALL (*) NEGATIVE   URINE MICROSCOPIC-ADD ON  Status: Abnormal   Collection Time   02/26/11 12:33 PM      Component Value Range   Squamous Epithelial / LPF MANY (*) RARE    WBC, UA 3-6  <3 (WBC/hpf)   RBC / HPF 7-10  <3 (RBC/hpf)   Bacteria, UA MANY (*) RARE    NST reactive.  Pt given vistaril 50mg  IM.  Pt discussed at length with Dr. Jackelyn Knife.  Assessment and Plan  Pain in preg: discussed with pt at length. Will give Rx for keflex and zofran. Will send urine for culture. Discussed diet, activity, risks, and precautions.  Clinton Gallant. Darald Uzzle III, DrHSc, MPAS, PA-C  02/26/2011, 12:54 PM

## 2011-02-28 NOTE — Progress Notes (Signed)
I reviewed FHT from 46-18.  Reactive NST, nothing concerning.

## 2011-03-09 ENCOUNTER — Inpatient Hospital Stay (HOSPITAL_COMMUNITY)
Admission: AD | Admit: 2011-03-09 | Discharge: 2011-03-09 | Disposition: A | Discharge status: Home / Self Care | Payer: Medicaid Other | Source: Ambulatory Visit | Attending: Obstetrics and Gynecology | Admitting: Obstetrics and Gynecology

## 2011-03-09 LAB — STREP B DNA PROBE: GBS: NEGATIVE

## 2011-03-09 NOTE — Progress Notes (Signed)
Pt states that she had a grren mucos discharge that when she used the BR-states it has changed to a clear thin discharge like water-states is wearing a pad-started at 1800

## 2011-03-09 NOTE — Progress Notes (Signed)
Pt in for possible srom.  States she started noticing a greenish, mucus discharge around 1800 and then noticed "a bunch of water just starting coming out".  Denies any leaking of fluid at present.  Denies any bleeding.  Reports occasional ucs.  + FM.

## 2011-03-15 ENCOUNTER — Inpatient Hospital Stay (HOSPITAL_COMMUNITY)
Admission: AD | Admit: 2011-03-15 | Discharge: 2011-03-15 | Disposition: A | Discharge status: Home / Self Care | Payer: Medicaid Other | Source: Ambulatory Visit | Attending: Obstetrics and Gynecology | Admitting: Obstetrics and Gynecology

## 2011-03-15 DIAGNOSIS — O2390 Unspecified genitourinary tract infection in pregnancy, unspecified trimester: Secondary | ICD-10-CM | POA: Insufficient documentation

## 2011-03-15 DIAGNOSIS — O479 False labor, unspecified: Secondary | ICD-10-CM | POA: Insufficient documentation

## 2011-03-15 DIAGNOSIS — D573 Sickle-cell trait: Secondary | ICD-10-CM | POA: Insufficient documentation

## 2011-03-15 DIAGNOSIS — O99019 Anemia complicating pregnancy, unspecified trimester: Secondary | ICD-10-CM | POA: Insufficient documentation

## 2011-03-15 DIAGNOSIS — N390 Urinary tract infection, site not specified: Secondary | ICD-10-CM | POA: Insufficient documentation

## 2011-03-15 LAB — URINE MICROSCOPIC-ADD ON

## 2011-03-15 LAB — URINALYSIS, ROUTINE W REFLEX MICROSCOPIC
Glucose, UA: NEGATIVE mg/dL
Ketones, ur: NEGATIVE mg/dL
Protein, ur: NEGATIVE mg/dL

## 2011-03-15 MED ORDER — CEPHALEXIN 500 MG PO CAPS: 500.0000 mg | ORAL_CAPSULE | Freq: Four times a day (QID) | ORAL | Status: AC

## 2011-03-15 NOTE — ED Provider Notes (Signed)
History   Pt presents today for labor evaluation. She states she has continued to have ctx that come and go but they seem to have stopped since coming to the MAU. She denies fever, vag bleeding, or any other sx at this time.  Chief Complaint  Patient presents with  . Contractions   HPI  OB History    Grav Para Term Preterm Abortions TAB SAB Ect Mult Living   3 1 1  0 1 0 1 0 0 1      Past Medical History  Diagnosis Date  . Sickle cell trait     Past Surgical History  Procedure Date  . Knee surgery     Family History  Problem Relation Age of Onset  . Diabetes Maternal Grandmother     History  Substance Use Topics  . Smoking status: Never Smoker   . Smokeless tobacco: Never Used  . Alcohol Use: No    Allergies: No Known Allergies  Prescriptions prior to admission  Medication Sig Dispense Refill  . Prenatal Multivit-Min-Fe-FA (PRENATAL VITAMINS) 0.8 MG tablet Take 1 tablet by mouth daily.        Marland Kitchen PRESCRIPTION MEDICATION Take 1 tablet by mouth daily. Heartburn med, cannot verify, due to rx closed        Review of Systems  Constitutional: Negative for fever.  Cardiovascular: Negative for chest pain.  Gastrointestinal: Positive for abdominal pain. Negative for nausea, vomiting, diarrhea and constipation.  Genitourinary: Negative for dysuria, urgency, frequency and hematuria.  Neurological: Negative for dizziness and headaches.  Psychiatric/Behavioral: Negative for depression and suicidal ideas.   Physical Exam   Blood pressure 135/80, pulse 109, temperature 98 F (36.7 C), temperature source Oral, resp. rate 20, height 5\' 4"  (1.626 m), weight 223 lb (101.152 kg).  Physical Exam  Constitutional: She is oriented to person, place, and time. She appears well-developed and well-nourished. No distress.  HENT:  Head: Atraumatic.  Eyes: EOM are normal. Pupils are equal, round, and reactive to light.  GI: Soft. She exhibits no distension. There is no tenderness. There  is no rebound and no guarding.  Genitourinary:       Cervix per nurse was 2/50/-3 which is unchanged from her last exam.  Neurological: She is alert and oriented to person, place, and time.  Skin: Skin is warm and dry. She is not diaphoretic.  Psychiatric: She has a normal mood and affect. Her behavior is normal. Judgment and thought content normal.    MAU Course  Procedures  NST reactive with no ctx at this time.  Results for orders placed during the hospital encounter of 03/15/11 (from the past 24 hour(s))  URINALYSIS, ROUTINE W REFLEX MICROSCOPIC     Status: Abnormal   Collection Time   03/15/11  5:26 PM      Component Value Range   Color, Urine YELLOW  YELLOW    Appearance HAZY (*) CLEAR    Specific Gravity, Urine 1.015  1.005 - 1.030    pH 6.0  5.0 - 8.0    Glucose, UA NEGATIVE  NEGATIVE (mg/dL)   Hgb urine dipstick LARGE (*) NEGATIVE    Bilirubin Urine NEGATIVE  NEGATIVE    Ketones, ur NEGATIVE  NEGATIVE (mg/dL)   Protein, ur NEGATIVE  NEGATIVE (mg/dL)   Urobilinogen, UA 1.0  0.0 - 1.0 (mg/dL)   Nitrite NEGATIVE  NEGATIVE    Leukocytes, UA MODERATE (*) NEGATIVE   URINE MICROSCOPIC-ADD ON     Status: Abnormal   Collection Time  03/15/11  5:26 PM      Component Value Range   Squamous Epithelial / LPF MANY (*) RARE    WBC, UA 7-10  <3 (WBC/hpf)   RBC / HPF 7-10  <3 (RBC/hpf)   Bacteria, UA FEW (*) RARE    Urine sent for culture. Pt discussed with Dr. Ellyn Hack. Assessment and Plan  UTI: discussed with pt at length. Will tx with Keflex. Discussed signs and sx of labor. Pt has f/u scheduled on Tuesday. Discussed diet, activity, risks, and precautions.  Clinton Gallant. Rice III, DrHSc, MPAS, PA-C  03/15/2011, 6:07 PM   Henrietta Hoover, PA 03/15/11 1811

## 2011-03-15 NOTE — ED Notes (Signed)
E. Rice PAC notified of urine results. Possible UTI. Dr. Ellyn Hack notified of results and that E. Rice PAc will treat her UTI and send her home. Dr. Ellyn Hack notified and aware.

## 2011-03-15 NOTE — Progress Notes (Signed)
Pt was seen in the Dr. Isidore Moos today and was 3.5 cm dilated. Pt presents to MAU with complaints of contractions that come every 1 min. Pt is here for a labor eval.

## 2011-03-15 NOTE — Progress Notes (Signed)
Dr. Ellyn Hack notified that patient was here in MAU for labor evaluation, notified her of cervical exam 2cm, 50%, -3 posterior. No contractions noted on monitor. Notified her that patient complains of pelvic pain and that a urine was sent. Dr. Ellyn Hack states to send patient home if urine is normal.

## 2011-03-24 ENCOUNTER — Telehealth (HOSPITAL_COMMUNITY): Payer: Self-pay | Admitting: *Deleted

## 2011-03-24 ENCOUNTER — Encounter (HOSPITAL_COMMUNITY): Payer: Self-pay | Admitting: *Deleted

## 2011-03-24 NOTE — Telephone Encounter (Signed)
Preadmission screen  

## 2011-03-28 ENCOUNTER — Encounter (HOSPITAL_COMMUNITY): Payer: Self-pay | Admitting: Anesthesiology

## 2011-03-28 ENCOUNTER — Encounter (HOSPITAL_COMMUNITY): Payer: Self-pay

## 2011-03-28 ENCOUNTER — Inpatient Hospital Stay (HOSPITAL_COMMUNITY)
Admission: RE | Admit: 2011-03-28 | Discharge: 2011-03-30 | DRG: 774 | Disposition: A | Discharge status: Home / Self Care | Payer: Medicaid Other | Source: Ambulatory Visit | Attending: Obstetrics and Gynecology | Admitting: Obstetrics and Gynecology

## 2011-03-28 ENCOUNTER — Inpatient Hospital Stay (HOSPITAL_COMMUNITY): Payer: Medicaid Other | Admitting: Anesthesiology

## 2011-03-28 DIAGNOSIS — L2089 Other atopic dermatitis: Secondary | ICD-10-CM

## 2011-03-28 DIAGNOSIS — E669 Obesity, unspecified: Secondary | ICD-10-CM

## 2011-03-28 DIAGNOSIS — R0602 Shortness of breath: Secondary | ICD-10-CM

## 2011-03-28 DIAGNOSIS — K649 Unspecified hemorrhoids: Secondary | ICD-10-CM

## 2011-03-28 DIAGNOSIS — R030 Elevated blood-pressure reading, without diagnosis of hypertension: Secondary | ICD-10-CM

## 2011-03-28 DIAGNOSIS — R350 Frequency of micturition: Secondary | ICD-10-CM

## 2011-03-28 DIAGNOSIS — R079 Chest pain, unspecified: Secondary | ICD-10-CM

## 2011-03-28 LAB — CBC
HCT: 34.3 % — ABNORMAL LOW (ref 36.0–46.0)
Hemoglobin: 10.3 g/dL — ABNORMAL LOW (ref 12.0–15.0)
MCH: 21.3 pg — ABNORMAL LOW (ref 26.0–34.0)
MCH: 21.5 pg — ABNORMAL LOW (ref 26.0–34.0)
MCV: 67.7 fL — ABNORMAL LOW (ref 78.0–100.0)
MCV: 67.5 fL — ABNORMAL LOW (ref 78.0–100.0)
Platelets: 185 10*3/uL (ref 150–400)
RBC: 4.8 MIL/uL (ref 3.87–5.11)
RDW: 16.3 % — ABNORMAL HIGH (ref 11.5–15.5)
WBC: 16.1 10*3/uL — ABNORMAL HIGH (ref 4.0–10.5)

## 2011-03-28 LAB — PROTIME-INR: INR: 1.05 (ref 0.00–1.49)

## 2011-03-28 MED ORDER — FENTANYL 2.5 MCG/ML BUPIVACAINE 1/10 % EPIDURAL INFUSION (WH - ANES)
14.0000 mL/h | INTRAMUSCULAR | Status: DC
  Administered 2011-03-28: 14 mL/h via EPIDURAL
  Filled 2011-03-28: qty 60

## 2011-03-28 MED ORDER — LIDOCAINE HCL 1.5 % IJ SOLN
INTRAMUSCULAR | Status: DC | PRN
  Administered 2011-03-28: 2 mL via EPIDURAL
  Administered 2011-03-28 (×2): 5 mL via EPIDURAL

## 2011-03-28 MED ORDER — OXYCODONE-ACETAMINOPHEN 5-325 MG PO TABS: 2.0000 | ORAL_TABLET | ORAL | Status: DC | PRN

## 2011-03-28 MED ORDER — OXYTOCIN BOLUS FROM INFUSION
500.0000 mL | Freq: Once | INTRAVENOUS | Status: DC
  Filled 2011-03-28: qty 500

## 2011-03-28 MED ORDER — IBUPROFEN 600 MG PO TABS: 600.0000 mg | ORAL_TABLET | Freq: Four times a day (QID) | ORAL | Status: DC | PRN

## 2011-03-28 MED ORDER — CARBOPROST TROMETHAMINE 250 MCG/ML IM SOLN
250.0000 ug | Freq: Once | INTRAMUSCULAR | Status: AC
  Administered 2011-03-28: 250 ug via INTRAMUSCULAR
  Filled 2011-03-28: qty 1

## 2011-03-28 MED ORDER — OXYTOCIN 20 UNITS IN LACTATED RINGERS INFUSION - SIMPLE
INTRAVENOUS | Status: AC
  Filled 2011-03-28: qty 1000

## 2011-03-28 MED ORDER — ONDANSETRON HCL 4 MG/2ML IJ SOLN
4.0000 mg | Freq: Once | INTRAMUSCULAR | Status: DC
  Filled 2011-03-28: qty 2

## 2011-03-28 MED ORDER — LACTATED RINGERS IV SOLN
INTRAVENOUS | Status: DC
  Administered 2011-03-28: 15:00:00 via INTRAVENOUS
  Administered 2011-03-28: 125 mL/h via INTRAVENOUS
  Administered 2011-03-28: 07:00:00 via INTRAVENOUS

## 2011-03-28 MED ORDER — EPHEDRINE 5 MG/ML INJ: 10.0000 mg | INTRAVENOUS | Status: DC | PRN

## 2011-03-28 MED ORDER — ONDANSETRON HCL 4 MG/2ML IJ SOLN
4.0000 mg | INTRAMUSCULAR | Status: DC | PRN
  Administered 2011-03-28: 4 mg via INTRAVENOUS

## 2011-03-28 MED ORDER — DIPHENHYDRAMINE HCL 50 MG/ML IJ SOLN: 12.5000 mg | INTRAMUSCULAR | Status: DC | PRN

## 2011-03-28 MED ORDER — OXYTOCIN 20 UNITS IN LACTATED RINGERS INFUSION - SIMPLE
125.0000 mL/h | Freq: Once | INTRAVENOUS | Status: DC
  Administered 2011-03-28: 125 mL/h via INTRAVENOUS
  Filled 2011-03-28: qty 1000

## 2011-03-28 MED ORDER — LACTATED RINGERS IV SOLN: 500.0000 mL | Freq: Once | INTRAVENOUS | Status: DC

## 2011-03-28 MED ORDER — LACTATED RINGERS IV SOLN: 500.0000 mL | INTRAVENOUS | Status: DC | PRN

## 2011-03-28 MED ORDER — EPHEDRINE 5 MG/ML INJ
10.0000 mg | INTRAVENOUS | Status: DC | PRN
  Filled 2011-03-28: qty 4

## 2011-03-28 MED ORDER — METHYLERGONOVINE MALEATE 0.2 MG/ML IJ SOLN
0.2000 mg | Freq: Once | INTRAMUSCULAR | Status: AC
  Administered 2011-03-28: 0.2 mg via INTRAMUSCULAR
  Filled 2011-03-28: qty 1

## 2011-03-28 MED ORDER — LIDOCAINE HCL (PF) 1 % IJ SOLN: 30.0000 mL | INTRAMUSCULAR | Status: DC | PRN

## 2011-03-28 MED ORDER — PHENYLEPHRINE 40 MCG/ML (10ML) SYRINGE FOR IV PUSH (FOR BLOOD PRESSURE SUPPORT)
80.0000 ug | PREFILLED_SYRINGE | INTRAVENOUS | Status: DC | PRN
  Filled 2011-03-28: qty 5

## 2011-03-28 MED ORDER — DIPHENOXYLATE-ATROPINE 2.5-0.025 MG PO TABS
1.0000 | ORAL_TABLET | Freq: Four times a day (QID) | ORAL | Status: DC | PRN
  Administered 2011-03-28: 1 via ORAL
  Filled 2011-03-28: qty 1

## 2011-03-28 MED ORDER — ONDANSETRON HCL 4 MG PO TABS: 4.0000 mg | ORAL_TABLET | ORAL | Status: DC | PRN

## 2011-03-28 MED ORDER — PHENYLEPHRINE 40 MCG/ML (10ML) SYRINGE FOR IV PUSH (FOR BLOOD PRESSURE SUPPORT): 80.0000 ug | PREFILLED_SYRINGE | INTRAVENOUS | Status: DC | PRN

## 2011-03-28 MED ORDER — OXYTOCIN 20 UNITS IN LACTATED RINGERS INFUSION - SIMPLE
1.0000 m[IU]/min | INTRAVENOUS | Status: DC
Start: 2011-03-28 — End: 2011-03-28
  Administered 2011-03-28: 1 m[IU]/min via INTRAVENOUS
  Administered 2011-03-28: 11 m[IU]/min via INTRAVENOUS
  Administered 2011-03-28: 12 m[IU]/min via INTRAVENOUS
  Administered 2011-03-28: 13 m[IU]/min via INTRAVENOUS

## 2011-03-28 MED ORDER — CARBOPROST TROMETHAMINE 250 MCG/ML IM SOLN
INTRAMUSCULAR | Status: AC
  Administered 2011-03-28: 22:00:00
  Filled 2011-03-28: qty 1

## 2011-03-28 NOTE — Progress Notes (Signed)
Pt has continued to have some blood clots with massage and even without massage. I gave methergine and inserted the Bakri balloon and filled it with 500cc's of fluid. I have informed the pt that a hysterectomy is possible. Her CBC shows a drop of only 0.5 GRAMS of hemoglobin from 10.8 to 10.3.

## 2011-03-28 NOTE — Progress Notes (Signed)
Pitocin at 13 mu/ minute and contractions q 3 minutes. FHR tracing is normal Cervix 4-5 cm dilated and 60% effaced and the vertex is at -3 station. AROM produced clear fluid. The pt requests an epidural.

## 2011-03-28 NOTE — Anesthesia Preprocedure Evaluation (Signed)
Anesthesia Evaluation  Name, MR# and DOB Patient awake  General Assessment Comment  Reviewed: Allergy & Precautions, H&P , NPO status , Patient's Chart, lab work & pertinent test results, reviewed documented beta blocker date and time   History of Anesthesia Complications Negative for: history of anesthetic complications  Airway Mallampati: II TM Distance: >3 FB Neck ROM: full    Dental  (+) Teeth Intact   Pulmonary (+) shortness of breath (related to pregnancy)   clear to auscultation  breath sounds clear to auscultation none    Cardiovascular regular Normal HTN in prior pregnancyHTN in prior pregnancy:     Neuro/Psych Negative Neurological ROS  Negative Psych ROS  GI/Hepatic/Renal negative GI ROS  negative Liver ROS  negative Renal ROS        Endo/Other  (+)   Morbid obesity  Abdominal   Musculoskeletal   Hematology  (+) Sickle cell trait, ,   Peds  Reproductive/Obstetrics (+) Pregnancy    Anesthesia Other Findings             Anesthesia Physical Anesthesia Plan  ASA: II  Anesthesia Plan: Epidural   Post-op Pain Management:    Induction:   Airway Management Planned:   Additional Equipment:   Intra-op Plan:   Post-operative Plan:   Informed Consent: I have reviewed the patients History and Physical, chart, labs and discussed the procedure including the risks, benefits and alternatives for the proposed anesthesia with the patient or authorized representative who has indicated his/her understanding and acceptance.     Plan Discussed with:   Anesthesia Plan Comments:         Anesthesia Quick Evaluation

## 2011-03-28 NOTE — Progress Notes (Signed)
Pitocin at 14 mu/ minute. Pt has an epidural and is comfortable. Cervix 5 cm 80% effaced and the vertex is at -1 station.

## 2011-03-28 NOTE — Progress Notes (Signed)
This is Dr. Ambrose Mantle dictating a history and physical on Natasha Smith  Date of the admission: 03/28/2011   This is a 20 year old African American female para 1011 gravida 3 EDC 04/01/2011 by ultrasound admitted for induction of labor  Blood group and type A positive negative antibody, Pap smear normal, rubella immune, RPR nonreactive, urine culture negative, hepatitis B surface antigen negative, HIV negative, hemoglobin electrophoresis AS GC negative Chlamydia negative repeat Chlamydia and GC negative cystic fibrosis screening negative first trimester screen negative AFP negative one hour Glucola 111 group B strep negative  Vaginal ultrasound 09/01/2010 estimated gestational age [redacted] weeks 4 days EDC 04/02/2011 ultrasound 10/28/2010 estimated gestational age [redacted] weeks 0 days EDC 03/31/2011 low lying placenta was noted follow-up ultrasound showed that the placenta had moved away from the cervix. The patient's prenatal course was complicated by nausea and vomiting of early pregnancy and inability to gain weight throughout the pregnancy she actually lost weight during the pregnancy.  Her cervix has been 3 cm dilated and she is admitted now for induction of labor  Past medical history allergies no known drug allergies operations knee surgery as a child medical history abnormal Pap smear in 2002 and no followup Chlamydia in 2008 kidney infection not hospitalized, preeclampsia in 2008, sickle cell trait Medications: prenatal vitamins. Family history: maternal grandmother with diabetes grandfather with heart disease mother and father with high blood pressure. The patient denies alcohol tobacco and drugs.   OB history 2008 8 and 11 ounce female infant vaginally with preeclampsia  12 of 11 spontaneous abortion   Social history 2 years of college, single  Physical exam: Blood pressure 120/77 temperature 97.7 pulse 84 respirations 16   Heart: Normal sinus rhythm no murmurs lungs: Clear to auscultation  Abdomen:  Soft fundal height consistent with dates  Cervix: 3 cm by my exam in the office, 50% effaced, vertex -2 station  Impression: Intrauterine pregnancy 39+ weeks, favorable cervix  Disposition: The patient will begin Pitocin to initiate induction of labor

## 2011-03-28 NOTE — Progress Notes (Signed)
I checked the pt again and found some blood clots on fundal massage. I evacuated the uterus and got another 30 cc clot. I gave another ampoule of hemabate and vigorously massaged the fundus.

## 2011-03-28 NOTE — Progress Notes (Signed)
Delivery note:  The pt became fully dilated at 2120 hours and delivered a living female infant spontaneously OA over an intact perineum at 2132 hours. The infant weighed 8 pounds 0 ounces and had Apgars 8 at 1 minute and 9 at 5 minutes. The uterus was normal but continued to bleed in spite of vigorous massage and pitocin so hemabate was given with some results. The uterus was examined and freed of clots and was massaged vigorously. There were no lacerations.EBL 800 cc's.

## 2011-03-28 NOTE — Progress Notes (Signed)
The nurse reports no clots or free flow on fundal massage but I did get 30 cc's of clots out with massage and evacuated the uterus of another few cc's. The flow is definitely slowing but is still more than I like. Will continue to observe.

## 2011-03-28 NOTE — Anesthesia Procedure Notes (Signed)
Epidural Patient location during procedure: OB Start time: 03/28/2011 6:10 PM Reason for block: procedure for pain  Staffing Performed by: anesthesiologist   Preanesthetic Checklist Completed: patient identified, site marked, surgical consent, pre-op evaluation, timeout performed, IV checked, risks and benefits discussed and monitors and equipment checked  Epidural Patient position: sitting Prep: site prepped and draped and DuraPrep Patient monitoring: continuous pulse ox and blood pressure Approach: midline Injection technique: LOR air  Needle:  Needle type: Tuohy  Needle gauge: 17 G Needle length: 9 cm Needle insertion depth: 5 cm cm Catheter type: closed end flexible Catheter size: 19 Gauge Catheter at skin depth: 10 cm Test dose: negative  Assessment Events: blood not aspirated, injection not painful, no injection resistance, negative IV test and no paresthesia

## 2011-03-29 ENCOUNTER — Other Ambulatory Visit: Payer: Self-pay | Admitting: Obstetrics and Gynecology

## 2011-03-29 LAB — MRSA PCR SCREENING: MRSA by PCR: NEGATIVE

## 2011-03-29 LAB — CBC
Hemoglobin: 8.9 g/dL — ABNORMAL LOW (ref 12.0–15.0)
MCH: 21.5 pg — ABNORMAL LOW (ref 26.0–34.0)
MCHC: 32 g/dL (ref 30.0–36.0)
MCV: 67.3 fL — ABNORMAL LOW (ref 78.0–100.0)
Platelets: 184 10*3/uL (ref 150–400)
RBC: 4.13 MIL/uL (ref 3.87–5.11)

## 2011-03-29 MED ORDER — DIPHENHYDRAMINE HCL 25 MG PO CAPS: 25.0000 mg | ORAL_CAPSULE | Freq: Four times a day (QID) | ORAL | Status: DC | PRN

## 2011-03-29 MED ORDER — TETANUS-DIPHTH-ACELL PERTUSSIS 5-2.5-18.5 LF-MCG/0.5 IM SUSP
0.5000 mL | Freq: Once | INTRAMUSCULAR | Status: AC
  Administered 2011-03-30: 0.5 mL via INTRAMUSCULAR
  Filled 2011-03-29 (×3): qty 0.5

## 2011-03-29 MED ORDER — ZOLPIDEM TARTRATE 5 MG PO TABS: 5.0000 mg | ORAL_TABLET | Freq: Every evening | ORAL | Status: DC | PRN

## 2011-03-29 MED ORDER — WITCH HAZEL-GLYCERIN EX PADS: 1.0000 "application " | MEDICATED_PAD | CUTANEOUS | Status: DC | PRN

## 2011-03-29 MED ORDER — SENNOSIDES-DOCUSATE SODIUM 8.6-50 MG PO TABS: 2.0000 | ORAL_TABLET | Freq: Every day | ORAL | Status: DC

## 2011-03-29 MED ORDER — OXYCODONE-ACETAMINOPHEN 5-325 MG PO TABS: 1.0000 | ORAL_TABLET | ORAL | Status: DC | PRN

## 2011-03-29 MED ORDER — BENZOCAINE-MENTHOL 20-0.5 % EX AERO: 1.0000 "application " | INHALATION_SPRAY | CUTANEOUS | Status: DC | PRN

## 2011-03-29 MED ORDER — LANOLIN HYDROUS EX OINT: TOPICAL_OINTMENT | CUTANEOUS | Status: DC | PRN

## 2011-03-29 MED ORDER — IBUPROFEN 600 MG PO TABS
600.0000 mg | ORAL_TABLET | Freq: Four times a day (QID) | ORAL | Status: DC
  Administered 2011-03-29 – 2011-03-30 (×5): 600 mg via ORAL
  Filled 2011-03-29 (×5): qty 1

## 2011-03-29 MED ORDER — DIBUCAINE 1 % RE OINT
1.0000 | TOPICAL_OINTMENT | RECTAL | Status: DC | PRN
Start: 2011-03-29 — End: 2011-03-30

## 2011-03-29 MED ORDER — MEASLES, MUMPS & RUBELLA VAC ~~LOC~~ INJ
0.5000 mL | INJECTION | Freq: Once | SUBCUTANEOUS | Status: DC
  Filled 2011-03-29: qty 0.5

## 2011-03-29 MED ORDER — SIMETHICONE 80 MG PO CHEW: 80.0000 mg | CHEWABLE_TABLET | ORAL | Status: DC | PRN

## 2011-03-29 MED ORDER — PRENATAL PLUS 27-1 MG PO TABS
1.0000 | ORAL_TABLET | Freq: Every day | ORAL | Status: DC
  Administered 2011-03-29 – 2011-03-30 (×2): 1 via ORAL
  Filled 2011-03-29 (×2): qty 1

## 2011-03-29 NOTE — Progress Notes (Signed)
#  1 There was no bleeding 30 minutes after the balloon had been relieved of 250 cc's so the remainder of the fluid was removed and the balloon was removed at around 8: 30 AM

## 2011-03-29 NOTE — Progress Notes (Signed)
#  1 afebrile There has been 135 cc's of bloody fluid from the Bakri balloon drain. There is no blood on her perineum.  Her VS are stable and the hemoglobin is only down to 8.9. Her main complaint is diarrhea which quite likely is secondary to the hemabate. I released 250cc's of fluid from the balloon and if there is no increased bleeding I will remove the balloon.   Natasha Smith

## 2011-03-30 MED ORDER — IBUPROFEN 600 MG PO TABS: 600.0000 mg | ORAL_TABLET | Freq: Four times a day (QID) | ORAL | Status: AC | PRN

## 2011-03-30 MED ORDER — TETANUS-DIPHTH-ACELL PERTUSSIS 5-2.5-18.5 LF-MCG/0.5 IM SUSP: 0.5000 mL | Freq: Once | INTRAMUSCULAR | Status: DC

## 2011-03-30 MED ORDER — OXYCODONE-ACETAMINOPHEN 5-325 MG PO TABS: 1.0000 | ORAL_TABLET | Freq: Four times a day (QID) | ORAL | Status: AC | PRN

## 2011-03-30 NOTE — Discharge Summary (Signed)
NAMERANISHA, Natasha Smith NO.:  [id number]  MEDICAL RECORD NO.:  [id number]  LOCATION:  9146                          FACILITY:  [facility]  PHYSICIAN:  Malachi Pro. Ambrose Mantle, M.D. DATE OF BIRTH:  [birth year]  DATE OF ADMISSION:  03/28/2011 DATE OF DISCHARGE:  03/30/2011                              DISCHARGE SUMMARY   A 20 year old African American female para 1-0-1-1 gravida 3, [name] April 01, 2011 by ultrasound admitted for induction of labor.  Her prenatal labs were remarkable for hemoglobin AS.  Otherwise all were normal.  After admission to the hospital, she was placed on Pitocin.  By 5:13 p.m., the Pitocin was at 13 milliunits a minute.  Contractions were every 3 minutes.  Cervix was 4-5 cm, 60%, vertex at -3 and artificial rupture of the membranes produced clear fluid.  She received an epidural and by 7:38 p.m. she was at 14 milliunits a minute, was comfortable. Cervix was 5 cm, 80% in the vertex and descended to a -1 station.  She became fully dilated at 9:20 and delivered a living female infant 8 pounds 0 ounces, OA over an intact perineum at 9:32 p.m.  Apgars were 8 at 1 and 9 at 5 minutes.  The uterus continued to bleed in spite of vigorous massage and Pitocin, so Hemabate was given with some results.  I checked the patient again at 10:35 p.m., found some blood clots on fundal massage.  I evacuated the uterus, got another 30 mL clot, gave another ampule of Hemabate and vigorously massaged the uterus.  At 10:44 p.m., I found more clots even though the nurse had not found any.  Continued to observe the patient and at 11:47 p.m. she continued to have blood clots with massage and even without massage.  I gave Methergine and then inserted the Bakri balloon and filled it with 500 mL of fluid. Thereafter she had no significant bleeding and on the second postpartum day she was ready for discharge.  She had no fever, no heavy bleeding. Her initial hemoglobin was 10.8,  hematocrit 34.3, white count 10,000, platelet count 185,000.  Followup hemoglobin the night of delivery was 10.3 and next morning was 8.9.  FINAL DIAGNOSES:  Intrauterine pregnancy at 39+ weeks delivered occipitoanterior, postpartum bleeding.  OPERATION:  Spontaneous delivery occipitoanterior, insertion of Bakri balloon for control of postpartum bleeding.  FINAL CONDITION:  Improved.  Instructions include our regular discharge instruction booklet.  She was given prescriptions for Percocet 5/325, 30 tablets 1 every 4-6 hours as needed for pain and Motrin 600 mg 30 tablets 1 every 6 hours as needed for pain.  She is advised to call with any problems and return to the office in 6 weeks.  She is to get her Tdap vaccine and she is also instructed that if she wants the baby circumcised in the office, it will need to be done within the first 2 weeks of life.  I have also asked her to take ferrous sulfate 325 mg twice daily.     Malachi Pro. Ambrose Mantle, M.D.    TFH/MEDQ  D:  03/30/2011  T:  03/30/2011  Job:  [id number]

## 2011-03-30 NOTE — Progress Notes (Signed)
#  2 afebrile BP normal ready for d/c.

## 2011-03-30 NOTE — Anesthesia Postprocedure Evaluation (Signed)
  Anesthesia Post-op Note  Patient: Natasha Smith  Procedure(s) Performed: * No procedures listed *  Patient Location: PACU and Mother/Baby  Anesthesia Type: Epidural  Level of Consciousness: awake, alert , oriented and patient cooperative  Airway and Oxygen Therapy: Patient Spontanous Breathing  Post-op Pain: none  Post-op Assessment: Post-op Vital signs reviewed and Patient's Cardiovascular Status Stable  Post-op Vital Signs: Reviewed and stable  Complications: No apparent anesthesia complications

## 2011-03-30 NOTE — Progress Notes (Signed)
UR chart review completed.  

## 2011-04-07 LAB — CBC
HCT: 42.7
Hemoglobin: 14.3
MCHC: 33.4
MCV: 81.3
RBC: 5.25
WBC: 11.4

## 2011-04-07 LAB — COMPREHENSIVE METABOLIC PANEL
AST: 17
BUN: 6
CO2: 26
Chloride: 102
Creatinine, Ser: 0.69
Glucose, Bld: 113 — ABNORMAL HIGH
Total Bilirubin: 0.4

## 2011-04-07 LAB — URINALYSIS, ROUTINE W REFLEX MICROSCOPIC
Glucose, UA: NEGATIVE
pH: 6

## 2011-04-07 LAB — URINE MICROSCOPIC-ADD ON

## 2011-04-07 LAB — GC/CHLAMYDIA PROBE AMP, GENITAL: GC Probe Amp, Genital: NEGATIVE

## 2011-04-22 LAB — COMPREHENSIVE METABOLIC PANEL
ALT: 13
Albumin: 2.9 — ABNORMAL LOW
Alkaline Phosphatase: 216 — ABNORMAL HIGH
Chloride: 107
Glucose, Bld: 94
Potassium: 3.7
Sodium: 136
Total Bilirubin: 0.7
Total Protein: 6.4

## 2011-04-22 LAB — CBC
Hemoglobin: 11.8 — ABNORMAL LOW
MCHC: 32.9
MCV: 73.1 — ABNORMAL LOW
Platelets: 176
RBC: 4.94
RDW: 16.2 — ABNORMAL HIGH
RDW: 16.4 — ABNORMAL HIGH
WBC: 13.4 — ABNORMAL HIGH

## 2011-04-22 LAB — LACTATE DEHYDROGENASE: LDH: 131

## 2011-04-25 LAB — URINE MICROSCOPIC-ADD ON

## 2011-04-25 LAB — URINALYSIS, ROUTINE W REFLEX MICROSCOPIC
Bilirubin Urine: NEGATIVE
Bilirubin Urine: NEGATIVE
Glucose, UA: NEGATIVE
Glucose, UA: NEGATIVE
Ketones, ur: NEGATIVE
Ketones, ur: NEGATIVE
Leukocytes, UA: NEGATIVE
Nitrite: NEGATIVE
Nitrite: NEGATIVE
Protein, ur: NEGATIVE
Specific Gravity, Urine: 1.015
Specific Gravity, Urine: 1.025
Urobilinogen, UA: 0.2
Urobilinogen, UA: 1
pH: 6
pH: 6

## 2011-04-25 LAB — WET PREP, GENITAL
Clue Cells Wet Prep HPF POC: NONE SEEN
Trich, Wet Prep: NONE SEEN

## 2011-04-25 LAB — COMPREHENSIVE METABOLIC PANEL
CO2: 21
Calcium: 9
Creatinine, Ser: 0.47
Glucose, Bld: 93
Total Protein: 5.8 — ABNORMAL LOW

## 2011-04-25 LAB — CBC
Hemoglobin: 11.1 — ABNORMAL LOW
MCHC: 32.5
MCHC: 32.7
MCV: 74.6 — ABNORMAL LOW
MCV: 75.3 — ABNORMAL LOW
RBC: 4.5
RBC: 4.5
RDW: 15.1 — ABNORMAL HIGH
RDW: 15.6 — ABNORMAL HIGH

## 2011-04-25 LAB — URIC ACID: Uric Acid, Serum: 3.1

## 2011-04-25 LAB — LACTATE DEHYDROGENASE: LDH: 123

## 2011-04-25 LAB — GC/CHLAMYDIA PROBE AMP, GENITAL
Chlamydia, DNA Probe: NEGATIVE
GC Probe Amp, Genital: NEGATIVE

## 2011-04-25 LAB — D-DIMER, QUANTITATIVE: D-Dimer, Quant: 2.5 — ABNORMAL HIGH

## 2011-06-24 ENCOUNTER — Emergency Department (HOSPITAL_COMMUNITY)
Admission: EM | Admit: 2011-06-24 | Discharge: 2011-06-25 | Disposition: A | Discharge status: Home / Self Care | Payer: Medicaid Other | Attending: Emergency Medicine | Admitting: Emergency Medicine

## 2011-06-24 ENCOUNTER — Encounter (HOSPITAL_COMMUNITY): Payer: Self-pay | Admitting: *Deleted

## 2011-06-24 DIAGNOSIS — R51 Headache: Secondary | ICD-10-CM | POA: Insufficient documentation

## 2011-06-24 DIAGNOSIS — Z7982 Long term (current) use of aspirin: Secondary | ICD-10-CM | POA: Insufficient documentation

## 2011-06-24 DIAGNOSIS — H53149 Visual discomfort, unspecified: Secondary | ICD-10-CM | POA: Insufficient documentation

## 2011-06-24 DIAGNOSIS — R112 Nausea with vomiting, unspecified: Secondary | ICD-10-CM | POA: Insufficient documentation

## 2011-06-24 DIAGNOSIS — R110 Nausea: Secondary | ICD-10-CM | POA: Insufficient documentation

## 2011-06-24 MED ORDER — PROMETHAZINE HCL 25 MG PO TABS
25.0000 mg | ORAL_TABLET | Freq: Four times a day (QID) | ORAL | Status: DC | PRN
Start: 2011-06-24 — End: 2012-06-03

## 2011-06-24 MED ORDER — SODIUM CHLORIDE 0.9 % IV BOLUS (SEPSIS)
1000.0000 mL | Freq: Once | INTRAVENOUS | Status: AC
Start: 2011-06-24 — End: 2011-06-24
  Administered 2011-06-24: 1000 mL via INTRAVENOUS

## 2011-06-24 MED ORDER — METOCLOPRAMIDE HCL 5 MG/ML IJ SOLN
10.0000 mg | Freq: Once | INTRAMUSCULAR | Status: AC
  Administered 2011-06-24: 10 mg via INTRAVENOUS
  Filled 2011-06-24: qty 2

## 2011-06-24 MED ORDER — SODIUM CHLORIDE 0.9 % IV BOLUS (SEPSIS)
1000.0000 mL | Freq: Once | INTRAVENOUS | Status: AC
  Administered 2011-06-24: 1000 mL via INTRAVENOUS

## 2011-06-24 MED ORDER — DIPHENHYDRAMINE HCL 50 MG/ML IJ SOLN
50.0000 mg | Freq: Once | INTRAMUSCULAR | Status: AC
  Administered 2011-06-24: 50 mg via INTRAVENOUS
  Filled 2011-06-24: qty 1

## 2011-06-24 MED ORDER — KETOROLAC TROMETHAMINE 30 MG/ML IJ SOLN
30.0000 mg | Freq: Once | INTRAMUSCULAR | Status: AC
  Administered 2011-06-24: 30 mg via INTRAVENOUS
  Filled 2011-06-24: qty 1

## 2011-06-24 MED ORDER — DEXAMETHASONE SODIUM PHOSPHATE 10 MG/ML IJ SOLN
10.0000 mg | Freq: Once | INTRAMUSCULAR | Status: AC
  Administered 2011-06-24: 10 mg via INTRAVENOUS
  Filled 2011-06-24: qty 1

## 2011-06-24 NOTE — ED Provider Notes (Signed)
History     CSN: [id number] Arrival date & time: 06/24/2011  6:22 PM   First MD Initiated Contact with Patient 06/24/11 1832      Chief Complaint  Patient presents with  . Headache   Patient is a 20 y.o. female presenting with headaches.  Headache  The pain does not radiate. Associated symptoms include nausea. Pertinent negatives include no anorexia, no fever, no malaise/fatigue, no chest pressure, no syncope, no shortness of breath and no vomiting. She has tried nothing for the symptoms.   Patient presents to the emergency room with complaint of headache that is located on the left side. Gradual in onset. Similar to her previous migraine headaches. Patient states that she has some photophobia, typical with her previous headaches. No visual loss or change. Patient denies any neurofocal deficits including weakness or numbness. Patient reports that is is throbbing in nature. States that she has some associated nausea and vomiting.   Past Medical History  Diagnosis Date  . Sickle cell trait   . Abnormal Pap smear   . History of chlamydia infection   . Pregnancy induced hypertension     hx    Past Surgical History  Procedure Date  . Knee surgery     Family History  Problem Relation Age of Onset  . Diabetes Maternal Grandmother   . Hypertension Mother   . Hypertension Father   . Heart disease Maternal Grandfather     History  Substance Use Topics  . Smoking status: Never Smoker   . Smokeless tobacco: Never Used  . Alcohol Use: No    OB History    Grav Para Term Preterm Abortions TAB SAB Ect Mult Living   3 2 2  0 1 0 1 0 0 2      Review of Systems  Constitutional: Negative for fever, chills, malaise/fatigue, diaphoresis and appetite change.  HENT: Negative for neck pain.   Eyes: Negative for photophobia and visual disturbance.  Respiratory: Negative for cough, chest tightness and shortness of breath.   Cardiovascular: Negative for chest pain and syncope.    Gastrointestinal: Positive for nausea. Negative for vomiting, abdominal pain and anorexia.  Genitourinary: Negative for flank pain.  Musculoskeletal: Negative for back pain.  Skin: Negative for rash.  Neurological: Positive for headaches. Negative for dizziness, tremors, seizures, syncope, facial asymmetry, speech difficulty, weakness, light-headedness and numbness.  All other systems reviewed and are negative.    Allergies  Review of patient's allergies indicates no known allergies.  Home Medications   Current Outpatient Rx  Name Route Sig Dispense Refill  . ASPIRIN EC 81 MG PO TBEC Oral Take 162 mg by mouth daily.        BP 144/110  Pulse 87  Temp(Src) 97.5 F (36.4 C) (Oral)  Resp 20  SpO2 100%  LMP 05/18/2011  Physical Exam  Nursing note and vitals reviewed. Constitutional: She is oriented to person, place, and time. She appears well-developed and well-nourished.  Non-toxic appearance. No distress.       Vital signs stable  HENT:  Head: Normocephalic and atraumatic.  Eyes: EOM are normal. Pupils are equal, round, and reactive to light.  Neck: Normal range of motion. Neck supple.  Cardiovascular: Normal rate, regular rhythm, normal heart sounds and intact distal pulses.  Exam reveals no gallop and no friction rub.   No murmur heard. Pulmonary/Chest: Effort normal and breath sounds normal. No respiratory distress. She has no wheezes.  Abdominal: Soft. Bowel sounds are normal. There is no  tenderness. There is no rebound and no guarding.  Musculoskeletal: Normal range of motion. She exhibits no edema and no tenderness.  Neurological: She is alert and oriented to person, place, and time. She displays no tremor and normal reflexes. No cranial nerve deficit or sensory deficit. She exhibits normal muscle tone. She displays a negative Romberg sign. She displays no seizure activity. Coordination and gait normal. GCS eye subscore is 4. GCS verbal subscore is 5. GCS motor subscore  is 6. She displays no Babinski's sign on the right side. She displays no Babinski's sign on the left side.       Normal finger to nose. Normal gait.   Skin: Skin is warm and dry. No rash noted. She is not diaphoretic.  Psychiatric: She has a normal mood and affect. Her behavior is normal. Judgment and thought content normal.    ED Course  Procedures (including critical care time)  Patient is not breastfeeding or pregnant. Patient states that this is her typical headache. Patient given ivf, decadron, reglan. Will monitor closely. Headache gradual in onset. No neurofocal deficits. Likely migraine headache.   9:51 PM patient states that her headache has resolved and is requesting to go home. Advised of warning signs to return. Stated agreement and understanding.   MDM  headache        Demetrius Charity, [place] 06/24/11 2152

## 2011-06-24 NOTE — ED Notes (Signed)
Boneta Lucks, PA at bedside assessing pt

## 2011-06-24 NOTE — ED Notes (Signed)
Headache and blurred vision for 2 hours.  She has taken aspirin but it is not any better

## 2011-06-24 NOTE — ED Provider Notes (Signed)
Medical screening examination/treatment/procedure(s) were performed by non-physician practitioner and as supervising physician I was immediately available for consultation/collaboration.   Arion Morgan, MD 06/24/11 2311 

## 2011-06-25 NOTE — ED Notes (Signed)
Discharge inst given  Voiced understanding 

## 2011-06-27 ENCOUNTER — Ambulatory Visit: Payer: Medicaid Other | Admitting: Family Medicine

## 2011-07-07 ENCOUNTER — Ambulatory Visit: Payer: Medicaid Other | Admitting: Family Medicine

## 2011-07-18 ENCOUNTER — Ambulatory Visit (INDEPENDENT_AMBULATORY_CARE_PROVIDER_SITE_OTHER): Payer: Medicaid Other | Admitting: Family Medicine

## 2011-07-18 ENCOUNTER — Encounter: Payer: Self-pay | Admitting: Family Medicine

## 2011-07-18 VITALS — BP 137/82 | HR 85 | Wt 208.8 lb

## 2011-07-18 DIAGNOSIS — N760 Acute vaginitis: Secondary | ICD-10-CM

## 2011-07-18 DIAGNOSIS — Z202 Contact with and (suspected) exposure to infections with a predominantly sexual mode of transmission: Secondary | ICD-10-CM

## 2011-07-18 LAB — POCT WET PREP (WET MOUNT)
Trichomonas Wet Prep HPF POC: NEGATIVE
WBC, Wet Prep HPF POC: >20

## 2011-07-18 NOTE — Patient Instructions (Signed)
Schedule an appointment for IUD insertion with Dr. Jennette Kettle (procedure clinic) or Dr. Tye Savoy. Call our office in 2-3 days for lab results. Have a great week!  Postpartum Depression After delivery, your body is going through a drastic change in hormone levels. You may find yourself crying for no apparent reason and unable to cope with all the changes a new baby brings. This is a common response following a pregnancy. Seek support from your partner and/or friends and just give yourself time to recover. If these feelings persist and you feel you are getting worse, contact your caregiver or other professionals who can help you.  WHAT IS DEPRESSION? Depression can be described as feeling sad, blue, unhappy, miserable, or down in the dumps. Most of [place] feel this way at one time or another for short periods. But true clinical depression is a mood disorder in which feelings of sadness, loss, anger, fear, or frustration interfere with everyday life for an extended time. Depression can be mild, moderate, or severe. The degree of depression, which your caregiver can determine, influences your treatment. Postpartum depression occurs within a couple days to months after delivering your baby. HOW COMMON IS DEPRESSION DURING AND AFTER PREGNANCY?  Depression that occurs during pregnancy or within a year after delivery is called perinatal depression. Depression after pregnancy is also called postpartum depression or peripartum depression. The exact number of women with depression during this time is unknown, but it occurs in between 10-15% of women. Researchers believe that depression is one of the most common complications during and after pregnancy. The depression is often not recognized or treated, because some normal pregnancy changes cause similar symptoms and are happening at the same time. Tiredness, problems sleeping, stronger emotional reactions, and changes in body weight may occur during and after pregnancy. But  these symptoms may also be signs of depression.  CAUSES  Rapid hormone changes. Estrogen and progesterone usually decrease immediately after delivering your baby. Researchers think the fast change in hormone levels may lead to depression, just as smaller changes in hormones can affect a woman's moods before she gets her menstrual period.   Decrease in thyroid hormone. Thyroid hormone regulates how your body uses and stores energy from food (metabolism). A simple blood test can tell if this condition is causing a woman's depression. If so, thyroid medicine can be prescribed by your caregiver.   A stressful life event, such as a death in the family. This can cause chemical changes in the brain that lead to depression.   Feeling overwhelmed by caring for and raising a new baby.   Depression is also an illness that runs in some families. It is not always clear what causes depression.  FACTORS THAT MAY INCREASE A WOMAN'S CHANCE OF DEPRESSION DURING PREGNANCY:  History of depression.   Substance abuse, alcohol, or drugs.   Little support from family and friends.   Problems with previous pregnancy or birth.   Young age for motherhood.   Living alone.   Little or no social support.   Family history of mental illness.   Anxiety about the fetus.   Marital or financial problems.   Postpartum depression in a previous pregnancy.   Having a psychiatric illness (schizophrenia, bipolar disorder).   Going through a difficult or stressful pregnancy.   Going through a difficult labor and delivery.   Moving to another city or state during your pregnancy, or just after delivering your baby.  OTHER FACTORS THAT MAY CONTRIBUTE TO POSTPARTUM  DEPRESSION INCLUDE:   Feeling tired after delivery, broken sleep patterns, and not getting enough rest. This often keeps a new mother from regaining her full strength for weeks.   Feeling overwhelmed with a new baby to take care of and doubting your  ability to be a good mother.   Feeling stress from changes in work and home routines. Women sometimes think they need to be "super mom" or perfect. This is not realistic and can add stress.   Having feelings of loss. This can include loss of the identity of who you are, or were, before having the baby, loss of control, loss of your pre-pregnancy figure, and feeling less attractive.   Having less free time and less control over your time. Needing to stay home, indoors, for longer periods of time and having less time to spend with your partner and loved ones can contribute to depression.   Having trouble doing your daily activities at home or at work.   Fears about not knowing how to take of the baby correctly and about harming the baby.   Feelings of guilt that you are not taking care of the baby properly.  SYMPTOMS Any of these symptoms, during and after pregnancy, that last longer than 2 weeks are signs of depression:  Feeling restless or irritable.   Feeling sad, hopeless, and overwhelmed.   Crying a lot.   Having no energy or motivation.   Eating too little or too much.   Sleeping too little or too much.   Trouble focusing, remembering, or making decisions.   Feeling worthless and guilty.   Loss of interest or pleasure in activities.   Withdrawal from friends and family.   Having headaches, chest pains, rapid or irregular heartbeat (palpitations), or fast and shallow breathing (hyperventilation).   After pregnancy, being afraid of hurting the baby or oneself, and not having any interest in the baby.   Not being able to care for yourself or the baby.   Loss of interest in caring for the baby.   Anxiety and panic attacks.   Thoughts of harming yourself, the baby, or someone else.   Feelings of guilt because you feel you are not taking care of the baby well enough.  WHAT IS THE DIFFERENCE BETWEEN "BABY BLUES," POSTPARTUM DEPRESSION, AND POSTPARTUM PSYCHOSIS?  The  "baby blues" occurs 70 to 80% of the time, and it can happen in the days right after childbirth. It normally goes away within a few days to a week. A new mother can have sudden mood swings, sadness, crying spells, loss of appetite, sleeping problems, and feel irritable, restless, anxious, and lonely. Symptoms are not severe and treatment usually is not needed. But there are things you can do to feel better. Nap when the baby does. Ask for help from your spouse, family members, and friends. Join a support group of new moms or talk with other moms. If the "baby blues" does not go away in a week to 10 days or gets worse, you may have postpartum depression.   Postpartum depression can happen anytime within the first year after childbirth. A woman may have a number of symptoms, such as sadness, lack of energy, trouble concentrating, anxiety, and feelings of guilt and worthlessness. The difference between postpartum depression and the "baby blues" is that the feelings in postpartum depression are much stronger and often affects a woman's well-being. It keeps her from functioning well for a longer period of time. Postpartum depression needs to be  treated by a caregiver. Counseling, support groups, and medicines can help.   Postpartum psychosis is rare. It occurs in 1 or 2 out of every 1000 births. It usually begins in the first 6 weeks after delivery. Women who have bipolar disorder, schizoaffective disorder, or family history of psychotic disease have a higher risk for developing postpartum psychosis. Symptoms may include delusions, hallucinations, sleep disturbances, and obsessive thoughts about the baby. A woman may have rapid mood swings, from depression, to irritability, to euphoria. This is a serious condition and needs professional care and treatment.  WHAT STEPS CAN I TAKE IF I HAVE SYMPTOMS OF DEPRESSION DURING PREGNANCY OR AFTER CHILDBIRTH?  Some women do not tell anyone about their symptoms, because they  feel embarrassed, ashamed, or guilty about feeling depressed when they are supposed to be happy. They worry that they will be viewed as unfit parents. Perinatal depression can happen to any woman. It does not mean you are a bad or a "not together" mom. You and your baby do not need to suffer. There is help. You should discuss these feelings with your spouse or partner, family, and caregiver.   There are different types of individual and group "talk therapies" that can help a woman with perinatal depression feel better and do better as a mom and as a person. Limited research suggests that many women with perinatal depression improve when treated with antidepressant medicine. Your caregiver can help you learn more about these options and decide which approach is best for you and your baby.   Speak to your caregiver if you are having symptoms of depression while you are pregnant or after you deliver your baby. Your caregiver can give you a questionnaire to test for depression. You can also be referred to a mental health professional who specializes in treating depression.  HOME CARE INSTRUCTIONS  Try to get as much rest as you can. Try to nap when the baby naps.   Stop putting pressure on yourself to do everything. Do as much as you can and leave the rest.   Ask for help with household chores and nighttime feedings. Ask your partner to bring the baby to you so you can breastfeed. If you can, have a friend, family member, or professional support person help you in the home for part of the day.   Talk to your partner, family, and friends about how you are feeling.   Do not spend a lot of time alone. Get dressed and leave the house. Run an errand or take a short walk.   Spend time alone with your partner.   Talk with other mothers so you can learn from their experiences.   Join a support group for women with depression. Call a local hotline or look in your telephone book for information and services.     Do not make any major life changes during pregnancy. Major changes can cause unneeded stress. However, sometimes big changes cannot be avoided. Arrange support and help in your new situation ahead of time.   Exercise regularly.   Eat a balanced and nourishing diet.   Seek help if there are marital or financial problems.   Take the medicine your caregiver gives, as directed.   Keep all your postpartum appointments.  TREATMENT There are 2 common types of treatment for depression.  Talk therapy. This involves talking to a therapist, psychologist, clergyperson, or social worker, in order to learn to change how depression makes you think, feel, and act.  Medicine. Your caregiver can give you an antidepressant medicine to help you. These medicines can help relieve the symptoms of depression.   Women who are pregnant or breast-feeding should talk with their caregivers about the advantages and risks of taking antidepressant medicines. Some women are concerned that taking these medicines may harm the baby. A mother's depression can affect her baby's development. Getting treatment is important for both mother and baby. The risks of taking medicine must be weighed against the risks of depression. It is a decision that women need to discuss carefully with their caregivers. Women who decide to take antidepressant medicines should talk to their caregivers about which antidepressant medicines are safer to take while pregnant or breastfeeding.  What effects can untreated depression have?  Depression not only hurts the mother, but it also affects her family. Some researchers have found that depression during pregnancy can raise the risk of delivering an underweight baby or a premature infant. Some women with depression have difficulty caring for themselves during pregnancy. They may have trouble eating and do not gain enough weight during the pregnancy. They may also have trouble sleeping, may miss prenatal  visits, may not follow medical instructions, have a poor diet, or may use harmful substances, like tobacco, alcohol, or illegal drugs.   Postpartum depression can affect a mother's ability to parent. She may lack energy, have trouble concentrating, be irritable, and not be able to meet her child's needs for love and affection. As a result, she may feel guilty and lose confidence in herself as a mother. This can make the depression worse. Researchers believe that postpartum depression can affect the infant by causing delays in language development, problems with emotional bonding to others, behavioral problems, lower activity levels, sleep problems, and distress. It helps if the father or another caregiver can assist in meeting the needs of the baby, and other children in the family, while the mother is depressed.   All children deserve the chance to have a healthy mom. All moms deserve the chance to enjoy their life and their children. Do not suffer alone. If you are experiencing symptoms of depression during pregnancy or after having a baby, tell a loved one and call your caregiver right away.  SEEK MEDICAL CARE IF:  You think you have postpartum depression.   You want medicine to treat your postpartum depression.   You want a referral to a psychiatrist or psychologist.   You are having a reaction or problems with your medicine.  SEEK IMMEDIATE MEDICAL CARE IF:  You have suicidal feelings.   You feel you may harm the baby.   You feel you may harm your spouse/partner, or someone else.   You feel you need to be admitted to a hospital now.   You feel you are losing control and need treatment immediately.  FOR MORE INFORMATION [employer] operational [employer] Health Information Center: http://hoffman.com/ National Institute of Mental Health, NIH, HHS: http://www.maynard.net/ American Psychological Association: DiceTournament.ca  Postpartum Education for Parents: www.sbpep.org National Mental Health Information  Center, SAMHSA, HHS: www.mentalhealth.org  National Mental Health Association: www.nmha.org Postpartum Support International: www.postpartum.net  Document Released: 03/31/2004 Document Revised: 03/09/2011 Document Reviewed: 07/09/2009 [facility] Patient Information 2012 [place], [place].

## 2011-07-18 NOTE — Progress Notes (Signed)
  Subjective:     Natasha Smith is a 21 y.o. female who presents for a postpartum visit. She is 4 months postpartum following a spontaneous vaginal delivery. I have fully reviewed the prenatal and intrapartum course. The delivery was at term gestational weeks. Anesthesia: epidural. Postpartum course has been going well. Baby's course has been complicated by spitting up. Baby is feeding by bottle - Goodstart Smooth. Bleeding no bleeding. Bowel function is normal. Bladder function is normal. Patient is sexually active. Contraception method is none. Postpartum depression screening: patient says she feels fatigued and depressed at times when she goes to bed.  Denies any homicidal or suicidal thoughts.  States that she has a good support system, calls her boyfriend's mother when she needs help with baby.  The following portions of the patient's history were reviewed and updated as appropriate: allergies, current medications, past medical history, past social history and problem list.  Review of Systems Pertinent items are noted in HPI.   Objective:    BP 137/82  Pulse 85  Wt 208 lb 12.8 oz (94.711 kg)  LMP 05/18/2011  General:  alert, cooperative and no distress  Lungs: clear to auscultation bilaterally  Heart:  regular rate and rhythm, S1, S2 normal, no murmur, click, rub or gallop   Vulva:  normal  Vagina: vagina positive for discharge  Cervix:  no lesions  Adnexa:  no mass, fullness, tenderness  Rectal Exam: Not performed.        Assessment:    4 month postpartum exam.  Plan:     1. Contraception: none, plans for IUD insertion, recommended condoms in the meantime 2. Follow up in: ASAP for IUD insertion 3. If depression becomes worse or begins to interfere with daily activities, return to clinic

## 2011-07-19 LAB — GC/CHLAMYDIA PROBE AMP, GENITAL
Chlamydia, DNA Probe: NEGATIVE
GC Probe Amp, Genital: NEGATIVE

## 2011-07-20 ENCOUNTER — Encounter: Payer: Self-pay | Admitting: Family Medicine

## 2011-07-20 MED ORDER — METRONIDAZOLE 500 MG PO TABS: 500.0000 mg | ORAL_TABLET | Freq: Three times a day (TID) | ORAL | Status: AC

## 2011-07-20 NOTE — Progress Notes (Signed)
Addended by: Tye Savoy, Nethaniel Mattie on: 07/20/2011 09:51 AM   Modules accepted: Orders

## 2011-08-29 ENCOUNTER — Emergency Department (HOSPITAL_COMMUNITY)
Admission: EM | Admit: 2011-08-29 | Discharge: 2011-08-29 | Discharge status: Against Medical Advice | Payer: Medicaid Other | Attending: Emergency Medicine | Admitting: Emergency Medicine

## 2011-08-29 ENCOUNTER — Encounter (HOSPITAL_COMMUNITY): Payer: Self-pay | Admitting: Emergency Medicine

## 2011-08-29 DIAGNOSIS — N949 Unspecified condition associated with female genital organs and menstrual cycle: Secondary | ICD-10-CM | POA: Insufficient documentation

## 2011-08-29 NOTE — ED Notes (Signed)
Pt reports vaginal area burning itching and pain with urination hx unprotected sex @ 2 WEEKS AGO

## 2011-11-29 ENCOUNTER — Ambulatory Visit (INDEPENDENT_AMBULATORY_CARE_PROVIDER_SITE_OTHER): Payer: Medicaid Other | Admitting: Family Medicine

## 2011-11-29 ENCOUNTER — Encounter: Payer: Self-pay | Admitting: Family Medicine

## 2011-11-29 ENCOUNTER — Other Ambulatory Visit (HOSPITAL_COMMUNITY)
Admission: RE | Admit: 2011-11-29 | Discharge: 2011-11-29 | Disposition: A | Discharge status: Home / Self Care | Payer: Medicaid Other | Source: Ambulatory Visit | Attending: Family Medicine | Admitting: Family Medicine

## 2011-11-29 VITALS — BP 134/98 | HR 73 | Temp 97.6°F | Ht 64.0 in | Wt 217.9 lb

## 2011-11-29 DIAGNOSIS — Z113 Encounter for screening for infections with a predominantly sexual mode of transmission: Secondary | ICD-10-CM | POA: Insufficient documentation

## 2011-11-29 DIAGNOSIS — R3989 Other symptoms and signs involving the genitourinary system: Secondary | ICD-10-CM

## 2011-11-29 DIAGNOSIS — R198 Other specified symptoms and signs involving the digestive system and abdomen: Secondary | ICD-10-CM

## 2011-11-29 DIAGNOSIS — IMO0001 Reserved for inherently not codable concepts without codable children: Secondary | ICD-10-CM

## 2011-11-29 DIAGNOSIS — N898 Other specified noninflammatory disorders of vagina: Secondary | ICD-10-CM

## 2011-11-29 DIAGNOSIS — Z309 Encounter for contraceptive management, unspecified: Secondary | ICD-10-CM

## 2011-11-29 DIAGNOSIS — N760 Acute vaginitis: Secondary | ICD-10-CM

## 2011-11-29 LAB — POCT WET PREP (WET MOUNT): Clue Cells Wet Prep Whiff POC: POSITIVE

## 2011-11-29 LAB — POCT URINE PREGNANCY: Preg Test, Ur: NEGATIVE

## 2011-11-29 MED ORDER — METRONIDAZOLE 500 MG PO TABS: 2000.0000 mg | ORAL_TABLET | Freq: Once | ORAL | Status: AC

## 2011-11-29 MED ORDER — MEDROXYPROGESTERONE ACETATE 150 MG/ML IM SUSP
150.0000 mg | Freq: Once | INTRAMUSCULAR | Status: AC
  Administered 2011-11-29: 150 mg via INTRAMUSCULAR

## 2011-11-29 NOTE — Assessment & Plan Note (Signed)
Depo provera given today as patient works out payment details in order to get mirena which is her preferred contraception choice.

## 2011-11-29 NOTE — Patient Instructions (Addendum)
[id number] Genital Herpes Genital herpes is a sexually transmitted disease. This means that it is a disease passed by having sex with an infected person. There is no cure for genital herpes. The time between attacks can be months to years. The virus may live in a person but produce no problems (symptoms). This infection can be passed to a baby as it travels down the birth canal (vagina). In a newborn, this can cause central nervous system damage, eye damage, or even death. The virus that causes genital herpes is usually HSV-2 virus. The virus that causes oral herpes is usually HSV-1. The diagnosis (learning what is wrong) is made through culture results. SYMPTOMS   Usually symptoms of pain and itching begin a few days to a week after contact. It first appears as small blisters that progress to small painful ulcers which then scab over and heal after several days. It affects the outer genitalia, birth canal, cervix, penis, anal area, buttocks, and thighs. HOME CARE INSTRUCTIONS    Keep ulcerated areas dry and clean.   Take medications as directed. Antiviral medications can speed up healing. They will not prevent recurrences or cure this infection. These medications can also be taken for suppression if there are frequent recurrences.   While the infection is active, it is contagious. Avoid all sexual contact during active infections.   Condoms may help prevent spread of the herpes virus.   Practice safe sex.   Wash your hands thoroughly after touching the genital area.   Avoid touching your eyes after touching your genital area.   Inform your caregiver if you have had genital herpes and become pregnant. It is your responsibility to insure a safe outcome for your baby in this pregnancy.   Only take over-the-counter or prescription medicines for pain, discomfort, or fever as directed by your caregiver.  SEEK MEDICAL CARE IF:    You have a recurrence of this infection.   You do not respond to  medications and are not improving.   You have new sources of pain or discharge which have changed from the original infection.   You have an oral temperature above 102 F (38.9 C).   You develop abdominal pain.   You develop eye pain or signs of eye infection.  Document Released: 06/24/2000 Document Revised: 06/16/2011 Document Reviewed: 07/15/2009 [facility] Patient Information 2012 [place], [place].

## 2011-11-29 NOTE — Assessment & Plan Note (Signed)
Clinically consistent with HSV.  Did not culture as has been open and present for 2 weeks, low likelihood or positive culture.  Gave patient education handouts on HSV and transmission.  Advised to present to clinic at onset of next outbreak for evaluation)possible culture) and to discuss treatment.

## 2011-11-29 NOTE — Progress Notes (Signed)
  Subjective:    Patient ID: Natasha Smith, female    DOB: 18-Jan-1991, 21 y.o.   MRN: [id number]  HPI Here to discuss vaginal discharge, labial sore, contraception  Vaginal discharge:  Several days, thing, no odor.  No abdominal pain, recent antibiotics.  Has dysuria due to sore, but no urinary frequency or dysuria at urethra.  Not consistent condom use.  Labial Sore:  Sore on right labia x 2 weeks,  Described as burning.  First episode.  No known history of HSV.  Contraception:  Not desiring pregnancy.  Would likk Mirena but needs to reapply and get Medicaid card which she has not done for several months.    Review of Systems See hpi    Objective:   Physical Exam GEN: NAD Pelvic Exam:        External: normal female genitalia, small possible ulceration on right labia minora.  Tender to palpation.        Vagina: normal without lesions or masses        Cervix: normal without lesions or masses        Adnexa: normal bimanual exam without masses or fullness        Samples for Wet prep, GC/Chlamydia obtained         Assessment & Plan:

## 2011-11-29 NOTE — Assessment & Plan Note (Signed)
Trichomonas on wet prep today.  Called and left message on phone.  Will treat with metronidazole.

## 2011-12-01 ENCOUNTER — Telehealth: Payer: Self-pay | Admitting: Family Medicine

## 2011-12-01 NOTE — Telephone Encounter (Signed)
Patient called back and was given the message below

## 2011-12-01 NOTE — Telephone Encounter (Signed)
Patient is calling for her results from yesterday.

## 2011-12-01 NOTE — Telephone Encounter (Signed)
LMOM for patient to call back. Wet prep was positive for Trichomonas. Antibiotic sent into pharmacy. Patient's partner needs to be treated. It is advised that patient does not have sex for 1 week

## 2011-12-20 ENCOUNTER — Telehealth: Payer: Self-pay | Admitting: *Deleted

## 2011-12-20 NOTE — Telephone Encounter (Signed)
Patient notified about positive chlamydia test from 11/29/2011. She will come in tomorrow for treatment.

## 2011-12-21 ENCOUNTER — Ambulatory Visit (INDEPENDENT_AMBULATORY_CARE_PROVIDER_SITE_OTHER): Payer: Medicaid Other | Admitting: *Deleted

## 2011-12-21 DIAGNOSIS — A749 Chlamydial infection, unspecified: Secondary | ICD-10-CM

## 2011-12-21 MED ORDER — AZITHROMYCIN 1 G PO PACK
1.0000 g | PACK | Freq: Once | ORAL | Status: AC
  Administered 2011-12-21: 1 g via ORAL

## 2011-12-21 NOTE — Progress Notes (Signed)
Patient in office for STD  treatment .  Advised to tell partner to get treatment . Abstain from sex for 7 days. Communicable Disease report faxed to [facility].

## 2011-12-22 ENCOUNTER — Ambulatory Visit (INDEPENDENT_AMBULATORY_CARE_PROVIDER_SITE_OTHER): Payer: Medicaid Other | Admitting: Family Medicine

## 2011-12-22 VITALS — BP 160/84 | HR 93 | Temp 98.1°F | Ht 64.0 in | Wt 211.4 lb

## 2011-12-22 DIAGNOSIS — Z2089 Contact with and (suspected) exposure to other communicable diseases: Secondary | ICD-10-CM

## 2011-12-22 DIAGNOSIS — M549 Dorsalgia, unspecified: Secondary | ICD-10-CM

## 2011-12-22 DIAGNOSIS — R21 Rash and other nonspecific skin eruption: Secondary | ICD-10-CM

## 2011-12-22 DIAGNOSIS — R300 Dysuria: Secondary | ICD-10-CM

## 2011-12-22 DIAGNOSIS — Z202 Contact with and (suspected) exposure to infections with a predominantly sexual mode of transmission: Secondary | ICD-10-CM

## 2011-12-22 LAB — POCT URINALYSIS DIPSTICK
Bilirubin, UA: NEGATIVE
Nitrite, UA: NEGATIVE
Spec Grav, UA: 1.025
pH, UA: 5.5

## 2011-12-22 LAB — POCT UA - MICROSCOPIC ONLY

## 2011-12-22 MED ORDER — TRIAMCINOLONE ACETONIDE 0.1 % EX CREA: TOPICAL_CREAM | Freq: Two times a day (BID) | CUTANEOUS | Status: DC

## 2011-12-22 NOTE — Patient Instructions (Addendum)
Rash: Steroid cream 2 x day for 10 days.   If not improved you need to return for recheck.   Abd/back pain x 2 per month- Let see if this improves with treatment of chlamydia.  If not improved in the next week or so return for recheck.  Walk daily to help with back discomfort.

## 2011-12-22 NOTE — Progress Notes (Signed)
  Subjective:    Patient ID: Natasha Smith, female    DOB: Apr 29, 1991, 21 y.o.   MRN: [id number]  HPI Back pain and lower abdominal pain: Patient states that since birth of her 36 month old-and receiving epidural. She has had pain that starts in back and wraps around to lower abdomen it occurs 2 times per month. Describes pain as sharp. Nothing seems to make it better-it goes away on its own.. Last a few moments. If she happens to use the bathroom at the time of this pain urination seems to make the pain worse. No dysuria. No urinary retention. No urinary frequency. No fever. No chills. No nausea. No vomiting. Patient treated for Chlamydia yesterday-found at last office visit.  Rash: Present for a few weeks on elbows bilateral and and if the then of arm of left arm. Also has had some bumps show up on fingers along the sides and on the bottom of the nail bed. Minimal itching. Doesn't like what looks. No drainage. No vesicles. Patient has not tried anything on the rash. Does have a history of eczema. 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 No fever.  Smoking status reviewed.  Review of Systems    as per above. Objective:   Physical Exam  Constitutional: She appears well-developed and well-nourished. No distress.  HENT:  Head: Normocephalic and atraumatic.  Neck: Normal range of motion.  Cardiovascular: Normal rate, regular rhythm and normal heart sounds.   No murmur heard. Pulmonary/Chest: Breath sounds normal. No respiratory distress. She has no wheezes. She has no rales.  Abdominal: Soft. She exhibits no distension and no mass. There is no tenderness. There is no rebound and no guarding.  Musculoskeletal: She exhibits no edema.       Back exam: Normal rom.  No pain on palpation of back muscles.  Normal strength in legs bilateral.  Normal sensation in legs bilateral.   Reflexes normal bilateral in lower ext.   Neurological: She is alert.  Skin:       Rash- Hyperpigmented papular rash  present in elbow area bilateral and in left AC area.  Also some scattered papules on the lateral and medial sides of digits bilateral, some papules present in areas near cuticle.           Assessment & Plan:

## 2011-12-23 ENCOUNTER — Encounter: Payer: Self-pay | Admitting: Family Medicine

## 2011-12-23 LAB — HIV ANTIBODY (ROUTINE TESTING W REFLEX): HIV: NONREACTIVE

## 2011-12-23 LAB — RPR

## 2011-12-24 NOTE — Assessment & Plan Note (Addendum)
No back pain or radiating abd pain currently.  Pain Episodes very short ( a few min) and occur 2 x per month. Pt treated yesterday for chlamydia.  Pt hasn't had any symptoms or problems with this.  I am not sure of the cause of the transient 2 x month short episodes of pain.  Pt to continue to monitor closely and return in the next 2 weeks if symptoms do not resolve with treatment of chlamydia.  Return if any new or worsening of symptoms.

## 2011-12-24 NOTE — Assessment & Plan Note (Signed)
Cause of rash unclear.  Pt does have eczema in past, but not a recent outbreak. A slightly atypical presentation of eczema but still possible. Pt to use steroid cream bid x 10 days.  If no improvement or if new or worsening of symptoms patient is to return for recheck.

## 2012-01-04 ENCOUNTER — Ambulatory Visit (INDEPENDENT_AMBULATORY_CARE_PROVIDER_SITE_OTHER): Payer: Medicaid Other | Admitting: Family Medicine

## 2012-01-04 VITALS — BP 149/85 | HR 91 | Temp 98.3°F | Ht 64.0 in | Wt 213.0 lb

## 2012-01-04 DIAGNOSIS — N920 Excessive and frequent menstruation with regular cycle: Secondary | ICD-10-CM

## 2012-01-04 LAB — CBC
MCV: 67.3 fL — ABNORMAL LOW (ref 78.0–100.0)
Platelets: 327 10*3/uL (ref 150–400)
RBC: 5.38 MIL/uL — ABNORMAL HIGH (ref 3.87–5.11)
RDW: 18.7 % — ABNORMAL HIGH (ref 11.5–15.5)
WBC: 9.2 10*3/uL (ref 4.0–10.5)

## 2012-01-04 MED ORDER — PROMETHAZINE HCL 12.5 MG PO TABS: 12.5000 mg | ORAL_TABLET | Freq: Four times a day (QID) | ORAL | Status: DC | PRN

## 2012-01-04 MED ORDER — NORGESTIMATE-ETH ESTRADIOL 0.25-35 MG-MCG PO TABS: ORAL_TABLET | ORAL | Status: DC

## 2012-01-04 NOTE — Patient Instructions (Addendum)
Please go and pick up the birth control pack You'll take 3 pills a day until the bleeding stops Then take 1 pill per day x7 days I have written you for some Phenergan for nausea if the pills make you feel sick You will get a call if your blood counts are particularly low Otherwise, restart iron twice a day

## 2012-01-05 ENCOUNTER — Encounter: Payer: Self-pay | Admitting: Family Medicine

## 2012-01-05 NOTE — Assessment & Plan Note (Addendum)
Reason evaluation for STDs. Change in her bleeding pattern after initiation of Depo. Will give Sprintec taper. Check CBC today for history of anemia. Start iron twice a day.

## 2012-01-05 NOTE — Progress Notes (Signed)
  Subjective:    Patient ID: Natasha Smith, female    DOB: 02-Jul-1991, 21 y.o.   MRN: [id number]  HPI  Patient presents after restarting Depo-Provera one month ago. She says that the last 2 weeks she has had light bleeding and it is continuing to get a little bit lighter. She is approximately to 3 pads per day. She does not think there is any possibility of pregnancy. Sometimes she will get short of breath with activity but is not have any chest pain. She has been anemic in the past and is not taking her iron. She is not having any abdominal pain and she is not having a discharge. She has no fever or cough  Review of Systems See above    Objective:   Physical Exam GEN-comfortable, alert. Pale-appearing Heart - normal rate, regular rhythm, normal S1, S2, no murmurs, rubs, clicks or gallops Chest - clear to auscultation, no wheezes, rales or rhonchi, symmetric air entry, no tachypnea, retractions or cyanosis Abdomen - soft, nontender, nondistended, no masses or organomegaly        Assessment & Plan:

## 2012-01-16 ENCOUNTER — Telehealth: Payer: Self-pay | Admitting: Family Medicine

## 2012-01-16 NOTE — Telephone Encounter (Signed)
This patient is coming in for [facility] insertion tomorrow.  Do we still have those available?  I thought we were out of those devices since we switched to Nexplanon.  I have not been trained in Nexplanon yet.

## 2012-01-16 NOTE — Telephone Encounter (Signed)
Ivy, All we have is Nexplanon. Should we reschedule this patient with someone else?

## 2012-01-17 ENCOUNTER — Ambulatory Visit: Payer: Medicaid Other | Admitting: Family Medicine

## 2012-01-26 ENCOUNTER — Telehealth: Payer: Self-pay | Admitting: Family Medicine

## 2012-01-26 NOTE — Telephone Encounter (Signed)
Natasha Smith is still having problems with the vaginal bleeding in spite of the new med she's has been taking for the bleeding.  Need you to contact her to discuss other options.

## 2012-01-26 NOTE — Telephone Encounter (Signed)
LVM for patient to call back to inform of below and schedule an appointment

## 2012-01-26 NOTE — Telephone Encounter (Signed)
Please ask patient to schedule appointment with PCP to discuss other birth control options.  Thanks.

## 2012-01-27 NOTE — Telephone Encounter (Signed)
LVM for patient to call back. ?

## 2012-01-27 NOTE — Telephone Encounter (Signed)
LVM for patient to call back. I am closing this message due to patient not calling back

## 2012-02-01 ENCOUNTER — Encounter: Payer: Self-pay | Admitting: Family Medicine

## 2012-02-01 ENCOUNTER — Ambulatory Visit (INDEPENDENT_AMBULATORY_CARE_PROVIDER_SITE_OTHER): Payer: Medicaid Other | Admitting: Family Medicine

## 2012-02-01 VITALS — BP 130/85 | HR 91 | Temp 97.4°F | Wt 215.0 lb

## 2012-02-01 DIAGNOSIS — N898 Other specified noninflammatory disorders of vagina: Secondary | ICD-10-CM

## 2012-02-01 DIAGNOSIS — N920 Excessive and frequent menstruation with regular cycle: Secondary | ICD-10-CM

## 2012-02-01 DIAGNOSIS — N939 Abnormal uterine and vaginal bleeding, unspecified: Secondary | ICD-10-CM

## 2012-02-01 LAB — POCT URINE PREGNANCY: Preg Test, Ur: NEGATIVE

## 2012-02-01 MED ORDER — ESTROGENS CONJUGATED 1.25 MG PO TABS: 1.2500 mg | ORAL_TABLET | Freq: Every day | ORAL | Status: DC

## 2012-02-01 MED ORDER — FERROUS SULFATE 325 (65 FE) MG PO TABS: 325.0000 mg | ORAL_TABLET | Freq: Three times a day (TID) | ORAL | Status: DC

## 2012-02-01 NOTE — Assessment & Plan Note (Signed)
Reported bleeding since May/ 21/2013. Now more intense. Has taken one time Sprintec taper with no improvement. Pregnancy test negative today. Pt is considering changing contraceptive. Plan: Supplemental  Estrogen with Premarin at 1.25mg  daily for 1-2 weeks until bleeding stops. Hb to evaluate for anemia. Iron supplement 325 mg TID daily. Discussed side effects of medications. Pt agreeable with plan.

## 2012-02-01 NOTE — Patient Instructions (Addendum)
It has been a pleasure to see you today. Please take the medications as prescribed. Make your next appointment in 2 weeks if bleeding has not stopped or sooner if bleeding worsens. Continue with your Depo-Provera Shots as scheduled or make an appointment with your PCP to discuss contraceptives options.

## 2012-02-01 NOTE — Progress Notes (Signed)
  Subjective:    Patient ID: Natasha Smith, female    DOB: Dec 02, 1990, 21 y.o.   MRN: [id number]  HPI Pt that comes today for evaluation of vaginal bleeding. She has been on Depo-Provera for 9 months and since her last shot 11/29/2011  she has vaginal bleeding. The vaginal bleeding is reported to be daily with 3-4 pads change a day and more intense at night. No abdominal pain reported. Pt states that for the past couple of days she is passing clots and changing pads more frequently. Again denies pain or other associated symptoms. No nausea, vomiting. No headache, lightheadedness, denies chest pain, palpitations or fatigue. She was on Sprintec taper but this did not help. She was recommended to take iron supplement but has not taken it.   Review of Systems   per HPI  Objective:   Physical Exam  Constitutional: She is oriented to person, place, and time. No distress.  HENT:  Mouth/Throat: Oropharynx is clear and moist.  Eyes: Conjunctivae and EOM are normal. Pupils are equal, round, and reactive to light.  Neck: Normal range of motion. Neck supple.  Cardiovascular: Normal rate and regular rhythm.   Pulmonary/Chest: Effort normal and breath sounds normal.  Abdominal: Soft. Bowel sounds are normal. There is no tenderness.  Genitourinary: Vagina normal and uterus normal.       Speculum: moderated active bleeding through OS. Normal cervix with no visible lesion.  Bimanual: no cervical motion tenderness. Uterus lateralized to the right with normal size. Adnexa no palpable. No tenderness with palpation.  Musculoskeletal: Normal range of motion. She exhibits no edema.  Neurological: She is alert and oriented to person, place, and time.  Skin: Skin is warm and dry.       Assessment & Plan:

## 2012-02-17 ENCOUNTER — Ambulatory Visit (INDEPENDENT_AMBULATORY_CARE_PROVIDER_SITE_OTHER): Payer: Medicaid Other | Admitting: Family Medicine

## 2012-02-17 ENCOUNTER — Encounter: Payer: Self-pay | Admitting: Family Medicine

## 2012-02-17 VITALS — BP 146/93 | HR 81 | Temp 97.9°F | Ht 64.0 in | Wt 214.1 lb

## 2012-02-17 DIAGNOSIS — N898 Other specified noninflammatory disorders of vagina: Secondary | ICD-10-CM

## 2012-02-17 DIAGNOSIS — D500 Iron deficiency anemia secondary to blood loss (chronic): Secondary | ICD-10-CM

## 2012-02-17 DIAGNOSIS — R3989 Other symptoms and signs involving the genitourinary system: Secondary | ICD-10-CM

## 2012-02-17 DIAGNOSIS — N920 Excessive and frequent menstruation with regular cycle: Secondary | ICD-10-CM

## 2012-02-17 DIAGNOSIS — R198 Other specified symptoms and signs involving the digestive system and abdomen: Secondary | ICD-10-CM

## 2012-02-17 MED ORDER — VALACYCLOVIR HCL 1 G PO TABS: 1000.0000 mg | ORAL_TABLET | Freq: Every day | ORAL | Status: AC

## 2012-02-17 MED ORDER — LIDOCAINE HCL 2 % EX GEL: Freq: Two times a day (BID) | CUTANEOUS | Status: DC | PRN

## 2012-02-17 NOTE — Patient Instructions (Signed)
Genital Herpes Genital herpes is a sexually transmitted disease. This means that it is a disease passed by having sex with an infected person. There is no cure for genital herpes. The time between attacks can be months to years. The virus may live in a person but produce no problems (symptoms). This infection can be passed to a baby as it travels down the birth canal (vagina). In a newborn, this can cause central nervous system damage, eye damage, or even death. The virus that causes genital herpes is usually HSV-2 virus. The virus that causes oral herpes is usually HSV-1. The diagnosis (learning what is wrong) is made through culture results. SYMPTOMS  Usually symptoms of pain and itching begin a few days to a week after contact. It first appears as small blisters that progress to small painful ulcers which then scab over and heal after several days. It affects the outer genitalia, birth canal, cervix, penis, anal area, buttocks, and thighs. HOME CARE INSTRUCTIONS   Keep ulcerated areas dry and clean.   Take medications as directed. Antiviral medications can speed up healing. They will not prevent recurrences or cure this infection. These medications can also be taken for suppression if there are frequent recurrences.   While the infection is active, it is contagious. Avoid all sexual contact during active infections.   Condoms may help prevent spread of the herpes virus.   Practice safe sex.   Wash your hands thoroughly after touching the genital area.   Avoid touching your eyes after touching your genital area.   Inform your caregiver if you have had genital herpes and become pregnant. It is your responsibility to insure a safe outcome for your baby in this pregnancy.   Only take over-the-counter or prescription medicines for pain, discomfort, or fever as directed by your caregiver.  SEEK MEDICAL CARE IF:   You have a recurrence of this infection.   You do not respond to medications and  are not improving.   You have new sources of pain or discharge which have changed from the original infection.   You have an oral temperature above 102 F (38.9 C).   You develop abdominal pain.   You develop eye pain or signs of eye infection.  Document Released: 06/24/2000 Document Revised: 06/16/2011 Document Reviewed: 07/15/2009 Exit[employer] Patient Information 2012 [employer]. 

## 2012-02-17 NOTE — Progress Notes (Signed)
Natasha Smith is a 21 y.o. who presents today for vaginal irritation.  Pt seen 2 weeks ago for menorrhagia and given premarin x 2 weeks.  Hgb ordered at this time as well shows Hgb of 12 along with MCV of 67 consistent w/ Fe deficient anemia. Pt still having significant bleeding, 7-9 pads per day, and at times is feeling lightheaded.  Has discussed going off her Depo and is scheduled with Dr. Tye Savoy for Mirena insertion in the near future.  Currently, pt is having burning of her L labia.  Has been going on for two days now and states that she has intermittent burning and pain to her left labia after she wipes from urinating.  Denies vaginal discharge, dysuria, dyspareunia, fever, chills, sweats,   Past Medical History  Diagnosis Date  . Sickle cell trait   . Abnormal Pap smear   . History of chlamydia infection   . Pregnancy induced hypertension     hx    History   Social History  . Marital Status: Single    Spouse Name: N/A    Number of Children: N/A  . Years of Education: N/A   Social History Main Topics  . Smoking status: Never Smoker   . Smokeless tobacco: Never Used  . Alcohol Use: No  . Drug Use: No  . Sexually Active: Yes    Birth Control/ Protection: Depo w/ Mirena insertion in near future   Family History  Problem Relation Age of Onset  . Diabetes Maternal Grandmother   . Hypertension Mother   . Hypertension Father   . Heart disease Maternal Grandfather     Current outpatient prescriptions:estrogens, conjugated, (PREMARIN) 1.25 MG tablet, Take 1 tablet (1.25 mg total) by mouth daily. For 2 weeks, Disp: 15 tablet, Rfl: 0;  ferrous sulfate (FERROUSUL) 325 (65 FE) MG tablet, Take 1 tablet (325 mg total) by mouth 3 (three) times daily with meals., norgestimate-ethinyl estradiol (SPRINTEC 28) 0.25-35 MG-MCG tablet, Take 3 pills a day until the bleeding stops, then take one pill a day x 7 days, Disp: 1 Package, Rfl: 11;  promethazine (PHENERGAN) 12.5 MG tablet, Take 1  tablet (12.5 mg total) by mouth every 6 (six) hours as needed for nausea., Disp: 30 tablet, Rfl: 0 promethazine (PHENERGAN) 25 MG tablet, Take 1 tablet (25 mg total) by mouth every 6 (six) hours as needed for nausea., Disp: 12 tablet, Rfl: 0;  triamcinolone cream (KENALOG) 0.1 %, Apply topically 2 (two) times daily., Disp: 30 g, Rfl: 1    Physical Exam Filed Vitals:   02/17/12 1436  BP: 146/93  Pulse: 81  Temp: 97.9 F (36.6 C)    Gen: NAD, Well nourished, Well developed, Obese HEENT: EOMI, [place]/AT Neck: no JVD Cardio: RRR, No murmurs/gallops/rubs Lungs: CTA, no wheezes, rhonchi, crackles Abd: NABS, soft nontender nondistended MSK: ROM normal  GU: + for vesicular lesions on L labia extending to vaginal wall.  + 15-20 vesicles, non ulcerated, tender to touch Psych: AAO x 3     [id number] Wt Readings from Last 3 Encounters:  02/17/12 214 lb 1.6 oz (97.115 kg)  02/01/12 215 lb (97.523 kg)  01/04/12 213 lb (96.616 kg)   Last period: Unknown Regular periods: no Heavy bleeding: yes, secondary to depo use 7-9 pads per day x 9 months  Sexually active: yes Birth control or hormonal therapy: Yes Hx of STD: Patient desires STD screening, sent for HSV cx of genital lesion Dyspareunia: No Hot flashes: No Vaginal discharge:  No Dysuria:No    FH of breast, uterine, ovarian, colon cancer: No

## 2012-02-17 NOTE — Assessment & Plan Note (Signed)
Pt is having a second episode of genital sores.  Sent for culture today after scrapping off a vesical and will notify pt of results.  In meantime will start on Valtrex 1 g x 5 days for tx of HSV genital lesions along with lidoderm 2% gel bid as needed for pain.  Counseled on safe sex practices and will f/u with Dr. Tye Savoy for Mirena insertion or if she becomes Sx before then.

## 2012-02-17 NOTE — Assessment & Plan Note (Signed)
Pt currently not having bleeding secondary to her depo.  Hgb was 12 on last labs, which is about baseline for her along with microcytic and elevated RDW conistent with iron deficiency anemia.  Recommended continuing her Fe Sulfate tid.

## 2012-02-20 LAB — HERPES SIMPLEX VIRUS CULTURE: Organism ID, Bacteria: DETECTED

## 2012-02-21 ENCOUNTER — Telehealth: Payer: Self-pay | Admitting: Family Medicine

## 2012-02-21 ENCOUNTER — Encounter: Payer: Self-pay | Admitting: Family Medicine

## 2012-02-21 DIAGNOSIS — B009 Herpesviral infection, unspecified: Secondary | ICD-10-CM | POA: Insufficient documentation

## 2012-02-21 NOTE — Telephone Encounter (Signed)
error 

## 2012-02-21 NOTE — Telephone Encounter (Signed)
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  Discussed continuing her valtrex for 5 total days, using condoms during sexual encounters, and making an appointment if Sx reappear for possible daily tx.  Gildardo Cranker, DO of [facility] 02/21/2012 3:20 PM

## 2012-02-21 NOTE — Telephone Encounter (Signed)
Called pt to let her know results are in her for her HSV-2 swab.  Left message stating the results of her test we performed last week are in and to call the office back so we could discuss the results, when she has a chance.  Did not disclose the tests we performed or the results in her message box.  Gildardo Cranker, DO of [facility] 02/21/2012 3:07 PM

## 2012-02-22 NOTE — Telephone Encounter (Signed)
This encounter was created in error - please disregard.

## 2012-03-07 ENCOUNTER — Telehealth: Payer: Self-pay | Admitting: Family Medicine

## 2012-03-07 NOTE — Telephone Encounter (Signed)
Spoke with patient and she states she took Valtrex for 5 days . Continues with vaginal irritation and burning. Per note  from 08/13  Dr. Paulina Fusi had advised if symptoms reappear to come back in for possible daily treatment. Appointment scheduled tomorrow.

## 2012-03-07 NOTE — Telephone Encounter (Signed)
Was told call to get meds the next time she had an outbreak - wants to speak with nurse

## 2012-03-08 ENCOUNTER — Ambulatory Visit: Payer: Medicaid Other

## 2012-03-21 ENCOUNTER — Ambulatory Visit (INDEPENDENT_AMBULATORY_CARE_PROVIDER_SITE_OTHER): Payer: Medicaid Other | Admitting: Family Medicine

## 2012-03-21 ENCOUNTER — Other Ambulatory Visit (HOSPITAL_COMMUNITY)
Admission: RE | Admit: 2012-03-21 | Discharge: 2012-03-21 | Disposition: A | Discharge status: Home / Self Care | Payer: Medicaid Other | Source: Ambulatory Visit | Attending: Family Medicine | Admitting: Family Medicine

## 2012-03-21 ENCOUNTER — Encounter: Payer: Self-pay | Admitting: Family Medicine

## 2012-03-21 VITALS — BP 153/95 | HR 71 | Temp 97.9°F | Ht 64.0 in | Wt 224.0 lb

## 2012-03-21 DIAGNOSIS — Z113 Encounter for screening for infections with a predominantly sexual mode of transmission: Secondary | ICD-10-CM | POA: Insufficient documentation

## 2012-03-21 DIAGNOSIS — A499 Bacterial infection, unspecified: Secondary | ICD-10-CM

## 2012-03-21 DIAGNOSIS — B9689 Other specified bacterial agents as the cause of diseases classified elsewhere: Secondary | ICD-10-CM

## 2012-03-21 DIAGNOSIS — N760 Acute vaginitis: Secondary | ICD-10-CM

## 2012-03-21 DIAGNOSIS — B009 Herpesviral infection, unspecified: Secondary | ICD-10-CM

## 2012-03-21 DIAGNOSIS — B079 Viral wart, unspecified: Secondary | ICD-10-CM

## 2012-03-21 LAB — POCT WET PREP (WET MOUNT)

## 2012-03-21 MED ORDER — PODOFILOX 0.5 % EX GEL: Freq: Two times a day (BID) | CUTANEOUS | Status: AC

## 2012-03-21 MED ORDER — METRONIDAZOLE 500 MG PO TABS: 500.0000 mg | ORAL_TABLET | Freq: Two times a day (BID) | ORAL | Status: AC

## 2012-03-21 MED ORDER — ACYCLOVIR 400 MG PO TABS: ORAL_TABLET | ORAL | Status: DC

## 2012-03-21 NOTE — Patient Instructions (Signed)
Take acyclovir three times a day for 5 days for current infection Then take twice a day from then on to prevent future recurrence     Genital Herpes Genital herpes is a sexually transmitted disease. This means that it is a disease passed by having sex with an infected person. There is no cure for genital herpes. The time between attacks can be months to years. The virus may live in a person but produce no problems (symptoms). This infection can be passed to a baby as it travels down the birth canal (vagina). In a newborn, this can cause central nervous system damage, eye damage, or even death. The virus that causes genital herpes is usually HSV-2 virus. The virus that causes oral herpes is usually HSV-1. The diagnosis (learning what is wrong) is made through culture results. SYMPTOMS  Usually symptoms of pain and itching begin a few days to a week after contact. It first appears as small blisters that progress to small painful ulcers which then scab over and heal after several days. It affects the outer genitalia, birth canal, cervix, penis, anal area, buttocks, and thighs. HOME CARE INSTRUCTIONS   Keep ulcerated areas dry and clean.   Take medications as directed. Antiviral medications can speed up healing. They will not prevent recurrences or cure this infection. These medications can also be taken for suppression if there are frequent recurrences.   While the infection is active, it is contagious. Avoid all sexual contact during active infections.   Condoms may help prevent spread of the herpes virus.   Practice safe sex.   Wash your hands thoroughly after touching the genital area.   Avoid touching your eyes after touching your genital area.   Inform your caregiver if you have had genital herpes and become pregnant. It is your responsibility to insure a safe outcome for your baby in this pregnancy.   Only take over-the-counter or prescription medicines for pain, discomfort, or fever  as directed by your caregiver.  SEEK MEDICAL CARE IF:   You have a recurrence of this infection.   You do not respond to medications and are not improving.   You have new sources of pain or discharge which have changed from the original infection.   You have an oral temperature above 102 F (38.9 C).   You develop abdominal pain.   You develop eye pain or signs of eye infection.

## 2012-03-21 NOTE — Assessment & Plan Note (Signed)
She was first diagnosed with genital herpes in August 2013. She has a recurrent episode versus ?inadequate treatment of primary outbreak despite completion of adequate course of acyclovir. Patient would like to be on chronic suppressive therapy.  -Course of acyclovir for current infection, then continue acyclovir at decreased dose for suppressive therapy -Follow-up in 1 year to discuss need for suppressive therapy

## 2012-03-21 NOTE — Assessment & Plan Note (Signed)
Metronidazole x 7 days 

## 2012-03-21 NOTE — Progress Notes (Signed)
  Subjective:    Patient ID: Natasha Smith, female    DOB: 1991-01-06, 21 y.o.   MRN: [id number]  HPI # She thinks she has recurrence of herpes. She was diagnosed with genital herpes (her first episode) in August 2013 from a vaginal culture of a lesion on her left labia.   She took a course of acyclovir, however, a few days ago, she has noticed another lesion, but this time on her right labia.  She is sexually active with her boyfriend only, and they use condoms all the time.   Review of Systems Denies fevers/chills/nausea Denies vaginal irritation or abnormal discharge Denies back pain Denies dysuria/frequency/urgency  Allergies, medication, past medical history reviewed.     Objective:   Physical Exam GEN: NAD; obese ABD: NABS, soft, NT, ND GU:    Vulva: normal; 1 mm skin tag on upper right thigh   Vagina: no tenderness or erythema; thin white discharge   Cervix: grossly normal   Rectum: 1 mm verrucous warts x 2 near anus    Assessment & Plan:

## 2012-03-21 NOTE — Assessment & Plan Note (Signed)
The lesions seem consistent with condylomata acuminata.  -Try Podofilox twice daily for 3 days for up to 4 cycles -Advised patient to follow-up if she would like them removed in clinic

## 2012-03-21 NOTE — Addendum Note (Signed)
Addended by: Madolyn Frieze, Marylene Land J on: 03/21/2012 12:16 PM   Modules accepted: Orders

## 2012-03-23 ENCOUNTER — Encounter: Payer: Self-pay | Admitting: Family Medicine

## 2012-03-23 ENCOUNTER — Telehealth: Payer: Self-pay | Admitting: Family Medicine

## 2012-03-23 NOTE — Telephone Encounter (Signed)
Negative GC/Chlamydia.

## 2012-04-27 ENCOUNTER — Telehealth: Payer: Self-pay | Admitting: Family Medicine

## 2012-04-27 DIAGNOSIS — B009 Herpesviral infection, unspecified: Secondary | ICD-10-CM

## 2012-04-27 MED ORDER — ACYCLOVIR 400 MG PO TABS: ORAL_TABLET | ORAL | Status: DC

## 2012-04-27 NOTE — Telephone Encounter (Signed)
rx sent.Natasha Smith  

## 2012-04-27 NOTE — Telephone Encounter (Signed)
Patient is calling for a refill on Acyclovir to be sent to [facility] on [address].

## 2012-05-29 ENCOUNTER — Other Ambulatory Visit: Payer: Self-pay | Admitting: Family Medicine

## 2012-06-03 ENCOUNTER — Encounter (HOSPITAL_COMMUNITY): Payer: Self-pay | Admitting: *Deleted

## 2012-06-03 ENCOUNTER — Emergency Department (HOSPITAL_COMMUNITY)
Admission: EM | Admit: 2012-06-03 | Discharge: 2012-06-03 | Disposition: A | Discharge status: Home / Self Care | Payer: Medicaid Other | Attending: Emergency Medicine | Admitting: Emergency Medicine

## 2012-06-03 DIAGNOSIS — Z862 Personal history of diseases of the blood and blood-forming organs and certain disorders involving the immune mechanism: Secondary | ICD-10-CM | POA: Insufficient documentation

## 2012-06-03 DIAGNOSIS — R52 Pain, unspecified: Secondary | ICD-10-CM | POA: Insufficient documentation

## 2012-06-03 DIAGNOSIS — R51 Headache: Secondary | ICD-10-CM

## 2012-06-03 DIAGNOSIS — Z8742 Personal history of other diseases of the female genital tract: Secondary | ICD-10-CM | POA: Insufficient documentation

## 2012-06-03 DIAGNOSIS — R112 Nausea with vomiting, unspecified: Secondary | ICD-10-CM | POA: Insufficient documentation

## 2012-06-03 LAB — URINALYSIS, ROUTINE W REFLEX MICROSCOPIC
Glucose, UA: NEGATIVE mg/dL
Ketones, ur: NEGATIVE mg/dL
Protein, ur: NEGATIVE mg/dL

## 2012-06-03 LAB — URINE MICROSCOPIC-ADD ON

## 2012-06-03 MED ORDER — KETOROLAC TROMETHAMINE 30 MG/ML IJ SOLN
30.0000 mg | Freq: Once | INTRAMUSCULAR | Status: AC
  Administered 2012-06-03: 30 mg via INTRAMUSCULAR
  Filled 2012-06-03: qty 1

## 2012-06-03 MED ORDER — METOCLOPRAMIDE HCL 5 MG/ML IJ SOLN
10.0000 mg | Freq: Once | INTRAMUSCULAR | Status: AC
  Administered 2012-06-03: 10 mg via INTRAMUSCULAR
  Filled 2012-06-03: qty 2

## 2012-06-03 MED ORDER — ONDANSETRON HCL 4 MG PO TABS: 4.0000 mg | ORAL_TABLET | Freq: Four times a day (QID) | ORAL | Status: DC

## 2012-06-03 MED ORDER — DIPHENHYDRAMINE HCL 50 MG/ML IJ SOLN
25.0000 mg | Freq: Once | INTRAMUSCULAR | Status: AC
  Administered 2012-06-03: 25 mg via INTRAMUSCULAR
  Filled 2012-06-03: qty 1

## 2012-06-03 NOTE — ED Notes (Addendum)
HA x 3 days with Abdominal pain, back pain, and N/V x 1. Denies fever.

## 2012-06-03 NOTE — ED Notes (Signed)
HA pain Anterior and posteriorly described as sharp.

## 2012-06-03 NOTE — ED Notes (Signed)
Pt discharged home. Had no further questions at the time. Vital signs stable. Ambulatory without difficulty.

## 2012-06-03 NOTE — ED Notes (Signed)
C/o HA. Also generalized body aches. Mentions: chest, back, abd. Rates HA 10/10. Onset 3d ago. Last tylenol 2000. Last ibuprofen 1200 noon Saturday. Also reports nv (denies: diarrhea or fever)

## 2012-06-03 NOTE — ED Provider Notes (Signed)
History     CSN: [id number]  Arrival date & time 06/03/12  4098   First MD Initiated Contact with Patient 06/03/12 0441      Chief Complaint  Patient presents with  . Headache  . Generalized Body Aches    (Consider location/radiation/quality/duration/timing/severity/associated sxs/prior treatment) HPI Pt presents with c/o frontal headache.  She states she has had throbbing headache intermittently over the past several days.  Has been trying ibuprofen and tylenol with temporary relief.  No fever.  Tonight states that she felt achy all over and had nausea and vomiting.  No diarrhea.  No changes in vision or speech.  States she has a hx of similar headache, but has never been put on medication for migraines. There are no other associated systemic symptoms, there are no other alleviating or modifying factors.   Past Medical History  Diagnosis Date  . Sickle cell trait   . Abnormal Pap smear   . History of chlamydia infection   . Pregnancy induced hypertension     hx    Past Surgical History  Procedure Date  . Knee surgery     Family History  Problem Relation Age of Onset  . Diabetes Maternal Grandmother   . Hypertension Mother   . Hypertension Father   . Heart disease Maternal Grandfather     History  Substance Use Topics  . Smoking status: Never Smoker   . Smokeless tobacco: Never Used  . Alcohol Use: No    OB History    Grav Para Term Preterm Abortions TAB SAB Ect Mult Living   3 2 2  0 1 0 1 0 0 2      Review of Systems ROS reviewed and all otherwise negative except for mentioned in HPI  Allergies  Review of patient's allergies indicates no known allergies.  Home Medications   Current Outpatient Rx  Name  Route  Sig  Dispense  Refill  . ACETAMINOPHEN 325 MG PO TABS   Oral   Take 650 mg by mouth every 6 (six) hours as needed. For pain         . IBUPROFEN 200 MG PO TABS   Oral   Take 600 mg by mouth every 6 (six) hours as needed. For pain         . ONDANSETRON HCL 4 MG PO TABS   Oral   Take 1 tablet (4 mg total) by mouth every 6 (six) hours.   12 tablet   0     BP 139/91  Pulse 91  Temp 97.8 F (36.6 C) (Oral)  Resp 18  SpO2 100% Vitals reviewed Physical Exam Physical Examination: General appearance - alert, well appearing, and in no distress Mental status - alert, oriented to person, place, and time Eyes - pupils equal and reactive, extraocular eye movements intact Mouth - mucous membranes moist, pharynx normal without lesions Neck - supple, no significant adenopathy Chest - clear to auscultation, no wheezes, rales or rhonchi, symmetric air entry Heart - normal rate, regular rhythm, normal S1, S2, no murmurs, rubs, clicks or gallops Abdomen - soft, nontender, nondistended, no masses or organomegaly Neurological - alert, oriented, normal speech, cranial nerves grossly intact, strength 5/5 in extremities x 4, sensation intact Extremities - peripheral pulses normal, no pedal edema, no clubbing or cyanosis Skin - normal coloration and turgor, no rashes  ED Course  Procedures (including critical care time)  Labs Reviewed  URINALYSIS, ROUTINE W REFLEX MICROSCOPIC - Abnormal; Notable for the following:  Hgb urine dipstick TRACE (*)     Leukocytes, UA TRACE (*)     All other components within normal limits  URINE MICROSCOPIC-ADD ON - Abnormal; Notable for the following:    Squamous Epithelial / LPF MANY (*)     Bacteria, UA MANY (*)     All other components within normal limits  PREGNANCY, URINE   No results found.   1. Headache   2. Nausea and vomiting       MDM  Pt presenting with frontal throbbing headache most c/w migraine.  She has associated nausea and vomiting.  Neurologic exam is normal, she has no signs or symptoms concerning for [facility] or other acute emergentcondition at this time.  Pt treated with toradol, reglan, benadryl with some relief of headache.  Urine appears to be a contaminated specimen.   Doubt UTI.  Discharged with strict return precautions.  Pt agreeable with plan.        Ethelda Chick, MD 06/03/12 [phone]

## 2012-06-28 ENCOUNTER — Ambulatory Visit (INDEPENDENT_AMBULATORY_CARE_PROVIDER_SITE_OTHER): Payer: Medicaid Other | Admitting: Family Medicine

## 2012-06-28 ENCOUNTER — Telehealth: Payer: Self-pay | Admitting: Family Medicine

## 2012-06-28 ENCOUNTER — Encounter (HOSPITAL_COMMUNITY): Payer: Self-pay | Admitting: *Deleted

## 2012-06-28 ENCOUNTER — Inpatient Hospital Stay (HOSPITAL_COMMUNITY)
Admission: AD | Admit: 2012-06-28 | Discharge: 2012-06-28 | Discharge status: Against Medical Advice | Payer: Medicaid Other | Source: Ambulatory Visit | Attending: Obstetrics & Gynecology | Admitting: Obstetrics & Gynecology

## 2012-06-28 ENCOUNTER — Encounter: Payer: Self-pay | Admitting: Family Medicine

## 2012-06-28 VITALS — BP 157/94 | HR 87 | Temp 98.1°F | Ht 64.0 in | Wt 226.5 lb

## 2012-06-28 DIAGNOSIS — Z3201 Encounter for pregnancy test, result positive: Secondary | ICD-10-CM | POA: Insufficient documentation

## 2012-06-28 DIAGNOSIS — R109 Unspecified abdominal pain: Secondary | ICD-10-CM | POA: Insufficient documentation

## 2012-06-28 DIAGNOSIS — R030 Elevated blood-pressure reading, without diagnosis of hypertension: Secondary | ICD-10-CM | POA: Insufficient documentation

## 2012-06-28 DIAGNOSIS — N912 Amenorrhea, unspecified: Secondary | ICD-10-CM

## 2012-06-28 DIAGNOSIS — B009 Herpesviral infection, unspecified: Secondary | ICD-10-CM

## 2012-06-28 LAB — URINALYSIS, ROUTINE W REFLEX MICROSCOPIC
Nitrite: NEGATIVE
Specific Gravity, Urine: 1.015 (ref 1.005–1.030)
Urobilinogen, UA: 0.2 mg/dL (ref 0.0–1.0)

## 2012-06-28 LAB — POCT UA - MICROSCOPIC ONLY

## 2012-06-28 LAB — URINE MICROSCOPIC-ADD ON

## 2012-06-28 LAB — POCT URINALYSIS DIPSTICK
Bilirubin, UA: NEGATIVE
Glucose, UA: NEGATIVE
Spec Grav, UA: 1.02

## 2012-06-28 MED ORDER — LIDOCAINE HCL 0.5 % EX GEL: 0.5000 [in_us] | Freq: Three times a day (TID) | CUTANEOUS | Status: DC | PRN

## 2012-06-28 NOTE — Assessment & Plan Note (Signed)
LMP 2nd week of November.  Urine pregnancy test positive today. - Advised patient to start taking Prenatal vitamins today - OB prenatal panel, HIV, and RPR ordered  - Schedule follow up with female provider for initial OB visit as soon as possible

## 2012-06-28 NOTE — Progress Notes (Signed)
  Subjective:    Patient ID: Natasha Smith, female    DOB: 1990/08/07, 21 y.o.   MRN: [id number]  HPI  Patient presents to clinic for pregnancy test.  LMP was 2nd week of November.  Home pregnancy test was positive.  She was on Depo but missed one dose and did not always use condoms.  Urine pregnancy POSITIVE today.  Patient complains of pelvic pain, sharp, intermittent.  Denies any dysuria, vaginal bleeding or discharge.  Denies any nausea or vomiting.    Patient mostly concerned about genital herpes.  She has been treated for it previously, but is concerned about herpes and pregnancy.  She still complains of burning sensation, but denies any open lesions or outbreak at this time.   Review of Systems  Per HPI    Objective:   Physical Exam  Constitutional: She appears well-nourished. No distress.  Abdominal: Soft. Bowel sounds are normal. She exhibits no distension. There is no tenderness.  Genitourinary: There is no rash, tenderness, lesion or injury on the right labia. There is no rash, tenderness, lesion or injury on the left labia. No erythema around the vagina. No vaginal discharge found.      Assessment & Plan:

## 2012-06-28 NOTE — MAU Note (Signed)
Trying to contact pt, due to Positive preg test and elevated  BP in triage.  Unable to reach on # on registration, msg left on s.o. Voice mail.

## 2012-06-28 NOTE — MAU Note (Signed)
Lower abd pain, started this morning. Stabbing, sharp pains.  Don't know what it is.

## 2012-06-28 NOTE — MAU Note (Signed)
Not in lobby, noted she is now at [facility]. Will remove from our board.

## 2012-06-28 NOTE — MAU Note (Signed)
Not in lobby #2 

## 2012-06-28 NOTE — Patient Instructions (Addendum)
It was nice to see you today.  Congratulations on your pregnancy! We will call you with time and date of your prenatal appointment. We will draw labs today prior to that visit. START taking Folic acid vitamins TODAY! Please call me if you notice any skin outbreaks or open sores.  ABCs of Pregnancy A Antepartum care is very important. Be sure you see your doctor and get prenatal care as soon as you think you are pregnant. At this time, you will be tested for infection, genetic abnormalities and potential problems with you and the pregnancy. This is the time to discuss diet, exercise, work, medications, labor, pain medication during labor and the possibility of a cesarean delivery. Ask any questions that may concern you. It is important to see your doctor regularly throughout your pregnancy. Avoid exposure to toxic substances and chemicals - such as cleaning solvents, lead and mercury, some insecticides, and paint. Pregnant women should avoid exposure to paint fumes, and fumes that cause you to feel ill, dizzy or faint. When possible, it is a good idea to have a pre-pregnancy consultation with your caregiver to begin some important recommendations your caregiver suggests such as, taking folic acid, exercising, quitting smoking, avoiding alcoholic beverages, etc. B Breastfeeding is the healthiest choice for both you and your baby. It has many nutritional benefits for the baby and health benefits for the mother. It also creates a very tight and loving bond between the baby and mother. Talk to your doctor, your family and friends, and your employer about how you choose to feed your baby and how they can support you in your decision. Not all birth defects can be prevented, but a woman can take actions that may increase her chance of having a healthy baby. Many birth defects happen very early in pregnancy, sometimes before a woman even knows she is pregnant. Birth defects or abnormalities of any child in your or  the father's family should be discussed with your caregiver. Get a good support bra as your breast size changes. Wear it especially when you exercise and when nursing.  C Celebrate the news of your pregnancy with the your spouse/father and family. Childbirth classes are helpful to take for you and the spouse/father because it helps to understand what happens during the pregnancy, labor and delivery. Cesarean delivery should be discussed with your doctor so you are prepared for that possibility. The pros and cons of circumcision if it is a boy, should be discussed with your pediatrician. Cigarette smoking during pregnancy can result in low birth weight babies. It has been associated with infertility, miscarriages, tubal pregnancies, infant death (mortality) and poor health (morbidity) in childhood. Additionally, cigarette smoking may cause long-term learning disabilities. If you smoke, you should try to quit before getting pregnant and not smoke during the pregnancy. Secondary smoke may also harm a mother and her developing baby. It is a good idea to ask people to stop smoking around you during your pregnancy and after the baby is born. Extra calcium is necessary when you are pregnant and is found in your prenatal vitamin, in dairy products, green leafy vegetables and in calcium supplements. D A healthy diet according to your current weight and height, along with vitamins and mineral supplements should be discussed with your caregiver. Domestic abuse or violence should be made known to your doctor right away to get the situation corrected. Drink more water when you exercise to keep hydrated. Discomfort of your back and legs usually develops and progresses  from the middle of the second trimester through to delivery of the baby. This is because of the enlarging baby and uterus, which may also affect your balance. Do not take illegal drugs. Illegal drugs can seriously harm the baby and you. Drink extra fluids (water  is best) throughout pregnancy to help your body keep up with the increases in your blood volume. Drink at least 6 to 8 glasses of water, fruit juice, or milk each day. A good way to know you are drinking enough fluid is when your urine looks almost like clear water or is very light yellow.  E Eat healthy to get the nutrients you and your unborn baby need. Your meals should include the five basic food groups. Exercise (30 minutes of light to moderate exercise a day) is important and encouraged during pregnancy, if there are no medical problems or problems with the pregnancy. Exercise that causes discomfort or dizziness should be stopped and reported to your caregiver. Emotions during pregnancy can change from being ecstatic to depression and should be understood by you, your partner and your family. F Fetal screening with ultrasound, amniocentesis and monitoring during pregnancy and labor is common and sometimes necessary. Take 400 micrograms of folic acid daily both before, when possible, and during the first few months of pregnancy to reduce the risk of birth defects of the brain and spine. All women who could possibly become pregnant should take a vitamin with folic acid, every day. It is also important to eat a healthy diet with fortified foods (enriched grain products, including cereals, rice, breads, and pastas) and foods with natural sources of folate (orange juice, green leafy vegetables, beans, peanuts, broccoli, asparagus, peas, and lentils). The father should be involved with all aspects of the pregnancy including, the prenatal care, childbirth classes, labor, delivery, and postpartum time. Fathers may also have emotional concerns about being a father, financial needs, and raising a family. G Genetic testing should be done appropriately. It is important to know your family and the father's history. If there have been problems with pregnancies or birth defects in your family, report these to your  doctor. Also, genetic counselors can talk with you about the information you might need in making decisions about having a family. You can call a major medical center in your area for help in finding a board-certified genetic counselor. Genetic testing and counseling should be done before pregnancy when possible, especially if there is a history of problems in the mother's or father's family. Certain ethnic backgrounds are more at risk for genetic defects. H Get familiar with the hospital where you will be having your baby. Get to know how long it takes to get there, the labor and delivery area, and the hospital procedures. Be sure your medical insurance is accepted there. Get your home ready for the baby including, clothes, the baby's room (when possible), furniture and car seat. Hand washing is important throughout the day, especially after handling raw meat and poultry, changing the baby's diaper or using the bathroom. This can help prevent the spread of many bacteria and viruses that cause infection. Your hair may become dry and thinner, but will return to normal a few weeks after the baby is born. Heartburn is a common problem that can be treated by taking antacids recommended by your caregiver, eating smaller meals 5 or 6 times a day, not drinking liquids when eating, drinking between meals and raising the head of your bed 2 to 3 inches. I Insurance  to cover you, the baby, doctor and hospital should be reviewed so that you will be prepared to pay any costs not covered by your insurance plan. If you do not have medical insurance, there are usually clinics and services available for you in your community. Take 30 milligrams of iron during your pregnancy as prescribed by your doctor to reduce the risk of low red blood cells (anemia) later in pregnancy. All women of childbearing age should eat a diet rich in iron. J There should be a joint effort for the mother, father and any other children to adapt to the  pregnancy financially, emotionally, and psychologically during the pregnancy. Join a support group for moms-to-be. Or, join a class on parenting or childbirth. Have the family participate when possible. K Know your limits. Let your caregiver know if you experience any of the following:   Pain of any kind.  Strong cramps.  You develop a lot of weight in a short period of time (5 pounds in 3 to 5 days).  Vaginal bleeding, leaking of amniotic fluid.  Headache, vision problems.  Dizziness, fainting, shortness of breath.  Chest pain.  Fever of 102 F (38.9 C) or higher.  Gush of clear fluid from your vagina.  Painful urination.  Domestic violence.  Irregular heartbeat (palpitations).  Rapid beating of the heart (tachycardia).  Constant feeling sick to your stomach (nauseous) and vomiting.  Trouble walking, fluid retention (edema).  Muscle weakness.  If your baby has decreased activity.  Persistent diarrhea.  Abnormal vaginal discharge.  Uterine contractions at 20-minute intervals.  Back pain that travels down your leg. L Learn and practice that what you eat and drink should be in moderation and healthy for you and your baby. Legal drugs such as alcohol and caffeine are important issues for pregnant women. There is no safe amount of alcohol a woman can drink while pregnant. Fetal alcohol syndrome, a disorder characterized by growth retardation, facial abnormalities, and central nervous system dysfunction, is caused by a woman's use of alcohol during pregnancy. Caffeine, found in tea, coffee, soft drinks and chocolate, should also be limited. Be sure to read labels when trying to cut down on caffeine during pregnancy. More than 200 foods, beverages, and over-the-counter medications contain caffeine and have a high salt content! There are coffees and teas that do not contain caffeine. M Medical conditions such as diabetes, epilepsy, and high blood pressure should be treated  and kept under control before pregnancy when possible, but especially during pregnancy. Ask your caregiver about any medications that may need to be changed or adjusted during pregnancy. If you are currently taking any medications, ask your caregiver if it is safe to take them while you are pregnant or before getting pregnant when possible. Also, be sure to discuss any herbs or vitamins you are taking. They are medicines, too! Discuss with your doctor all medications, prescribed and over-the-counter, that you are taking. During your prenatal visit, discuss the medications your doctor may give you during labor and delivery. N Never be afraid to ask your doctor or caregiver questions about your health, the progress of the pregnancy, family problems, stressful situations, and recommendation for a pediatrician, if you do not have one. It is better to take all precautions and discuss any questions or concerns you may have during your office visits. It is a good idea to write down your questions before you visit the doctor. O Over-the-counter cough and cold remedies may contain alcohol or other ingredients that  should be avoided during pregnancy. Ask your caregiver about prescription, herbs or over-the-counter medications that you are taking or may consider taking while pregnant.  P Physical activity during pregnancy can benefit both you and your baby by lessening discomfort and fatigue, providing a sense of well-being, and increasing the likelihood of early recovery after delivery. Light to moderate exercise during pregnancy strengthens the belly (abdominal) and back muscles. This helps improve posture. Practicing yoga, walking, swimming, and cycling on a stationary bicycle are usually safe exercises for pregnant women. Avoid scuba diving, exercise at high altitudes (over 3000 feet), skiing, horseback riding, contact sports, etc. Always check with your doctor before beginning any kind of exercise, especially during  pregnancy and especially if you did not exercise before getting pregnant. Q Queasiness, stomach upset and morning sickness are common during pregnancy. Eating a couple of crackers or dry toast before getting out of bed. Foods that you normally love may make you feel sick to your stomach. You may need to substitute other nutritious foods. Eating 5 or 6 small meals a day instead of 3 large ones may make you feel better. Do not drink with your meals, drink between meals. Questions that you have should be written down and asked during your prenatal visits. R Read about and make plans to baby-proof your home. There are important tips for making your home a safer environment for your baby. Review the tips and make your home safer for you and your baby. Read food labels regarding calories, salt and fat content in the food. S Saunas, hot tubs, and steam rooms should be avoided while you are pregnant. Excessive high heat may be harmful during your pregnancy. Your caregiver will screen and examine you for sexually transmitted diseases and genetic disorders during your prenatal visits. Learn the signs of labor. Sexual relations while pregnant is safe unless there is a medical or pregnancy problem and your caregiver advises against it. T Traveling long distances should be avoided especially in the third trimester of your pregnancy. If you do have to travel out of state, be sure to take a copy of your medical records and medical insurance plan with you. You should not travel long distances without seeing your doctor first. Most airlines will not allow you to travel after 36 weeks of pregnancy. Toxoplasmosis is an infection caused by a parasite that can seriously harm an unborn baby. Avoid eating undercooked meat and handling cat litter. Be sure to wear gloves when gardening. Tingling of the hands and fingers is not unusual and is due to fluid retention. This will go away after the baby is born. U Womb (uterus) size  increases during the first trimester. Your kidneys will begin to function more efficiently. This may cause you to feel the need to urinate more often. You may also leak urine when sneezing, coughing or laughing. This is due to the growing uterus pressing against your bladder, which lies directly in front of and slightly under the uterus during the first few months of pregnancy. If you experience burning along with frequency of urination or bloody urine, be sure to tell your doctor. The size of your uterus in the third trimester may cause a problem with your balance. It is advisable to maintain good posture and avoid wearing high heels during this time. An ultrasound of your baby may be necessary during your pregnancy and is safe for you and your baby. V Vaccinations are an important concern for pregnant women. Get needed vaccines before  pregnancy. Center for Disease Control ([employer]) has clear guidelines for the use of vaccines during pregnancy. Review the list, be sure to discuss it with your doctor. Prenatal vitamins are helpful and healthy for you and the baby. Do not take extra vitamins except what is recommended. Taking too much of certain vitamins can cause overdose problems. Continuous vomiting should be reported to your caregiver. Varicose veins may appear especially if there is a family history of varicose veins. They should subside after the delivery of the baby. Support hose helps if there is leg discomfort. W Being overweight or underweight during pregnancy may cause problems. Try to get within 15 pounds of your ideal weight before pregnancy. Remember, pregnancy is not a time to be dieting! Do not stop eating or start skipping meals as your weight increases. Both you and your baby need the calories and nutrition you receive from a healthy diet. Be sure to consult with your doctor about your diet. There is a formula and diet plan available depending on whether you are overweight or underweight.  Your caregiver or nutritionist can help and advise you if necessary. X Avoid X-rays. If you must have dental work or diagnostic tests, tell your dentist or physician that you are pregnant so that extra care can be taken. X-rays should only be taken when the risks of not taking them outweigh the risk of taking them. If needed, only the minimum amount of radiation should be used. When X-rays are necessary, protective lead shields should be used to cover areas of the body that are not being X-rayed. Y Your baby loves you. Breastfeeding your baby creates a loving and very close bond between the two of you. Give your baby a healthy environment to live in while you are pregnant. Infants and children require constant care and guidance. Their health and safety should be carefully watched at all times. After the baby is born, rest or take a nap when the baby is sleeping. Z Get your ZZZs. Be sure to get plenty of rest. Resting on your side as often as possible, especially on your left side is advised. It provides the best circulation to your baby and helps reduce swelling. Try taking a nap for 30 to 45 minutes in the afternoon when possible. After the baby is born rest or take a nap when the baby is sleeping. Try elevating your feet for that amount of time when possible. It helps the circulation in your legs and helps reduce swelling.  Most information courtesy of the CDC. Document Released: 06/27/2005 Document Revised: 09/19/2011 Document Reviewed: 03/11/2009 [facility] Patient Information 2013 [place], [place].

## 2012-06-28 NOTE — MAU Note (Signed)
Not in lobby

## 2012-06-28 NOTE — Telephone Encounter (Signed)
Need a verification of pregnancy faxed to Dr Ambrose Mantle [phone]

## 2012-06-28 NOTE — Assessment & Plan Note (Signed)
No active lesions on exam today, but patient complains of burning and irritation of vaginal labial majora. - Counseled patient on Herpes and pregnancy and prophylaxis at 36 weeks - Advised patient to return to clinic if she has active lesions - Viscous lidocaine jelly sent to pharmacy

## 2012-06-29 ENCOUNTER — Telehealth: Payer: Self-pay | Admitting: Family Medicine

## 2012-06-29 LAB — OBSTETRIC PANEL
Basophils Absolute: 0 10*3/uL (ref 0.0–0.1)
Basophils Relative: 0 % (ref 0–1)
Eosinophils Relative: 1 % (ref 0–5)
Lymphocytes Relative: 18 % (ref 12–46)
MCHC: 33.1 g/dL (ref 30.0–36.0)
MCV: 77.6 fL — ABNORMAL LOW (ref 78.0–100.0)
Neutro Abs: 10.4 10*3/uL — ABNORMAL HIGH (ref 1.7–7.7)
Platelets: 278 10*3/uL (ref 150–400)
RDW: 14.7 % (ref 11.5–15.5)
Rubella: 19.4 Index — ABNORMAL HIGH (ref <0.90)
WBC: 13.5 10*3/uL — ABNORMAL HIGH (ref 4.0–10.5)

## 2012-06-29 LAB — HIV ANTIBODY (ROUTINE TESTING W REFLEX): HIV: NONREACTIVE

## 2012-06-29 MED ORDER — METRONIDAZOLE 500 MG PO TABS: 500.0000 mg | ORAL_TABLET | Freq: Two times a day (BID) | ORAL | Status: DC

## 2012-06-29 NOTE — Telephone Encounter (Signed)
UA was positive for MANY trichomoniasis.  I sent Flagyl 500 BID x 5 days to pharmacy.  Will send 2 Rx to pharmacy.  Please inform patient and also advise [facility]'s [facility]  Thanks team!

## 2012-06-29 NOTE — Telephone Encounter (Signed)
Patient is calling to check the status of the below message.  She really needs this sent by the end of the day today.

## 2012-06-29 NOTE — Telephone Encounter (Signed)
Called the number that is listed and got the message that the number is not working.Laureen Ochs, Viann Shove

## 2012-06-30 LAB — CULTURE, OB URINE: Colony Count: >=100000

## 2012-07-06 ENCOUNTER — Telehealth: Payer: Self-pay | Admitting: Family Medicine

## 2012-07-06 NOTE — Telephone Encounter (Signed)
Hi Natasha Smith - no, she did not mention going to Dr. Ambrose Mantle to me.  I even mentioned other female 1st and 2nd years who would be happy to provide prenatal care since I will be graduating, and she seemed fine with that.  Sorry about the inconvenience.

## 2012-07-06 NOTE — Telephone Encounter (Signed)
Message copied by Barnabas Lister on Fri Jul 06, 2012  8:34 AM ------      Message from: Damita Lack      Created: Fri Jun 29, 2012  4:55 PM       Did this patient say anything to you about going to Dr. Ambrose Mantle for prenatal care?  We should not have drawn her labs here if that is the case.  Tried calling her but the number we have is not working.      It looks like she called in requesting her pregnancy test be faxed to him.  I am not going to schedule her especially since I can't get in touch with her.            Thanks,      Lupita Leash

## 2012-07-09 ENCOUNTER — Encounter (HOSPITAL_COMMUNITY): Payer: Self-pay

## 2012-07-09 ENCOUNTER — Inpatient Hospital Stay (HOSPITAL_COMMUNITY)
Admission: AD | Admit: 2012-07-09 | Discharge: 2012-07-09 | Disposition: A | Discharge status: Hospice | Payer: Medicaid Other | Source: Ambulatory Visit | Attending: Obstetrics and Gynecology | Admitting: Obstetrics and Gynecology

## 2012-07-09 DIAGNOSIS — R109 Unspecified abdominal pain: Secondary | ICD-10-CM | POA: Insufficient documentation

## 2012-07-09 DIAGNOSIS — O210 Mild hyperemesis gravidarum: Secondary | ICD-10-CM | POA: Insufficient documentation

## 2012-07-09 DIAGNOSIS — O219 Vomiting of pregnancy, unspecified: Secondary | ICD-10-CM

## 2012-07-09 LAB — URINALYSIS, ROUTINE W REFLEX MICROSCOPIC
Bilirubin Urine: NEGATIVE
Ketones, ur: NEGATIVE mg/dL
Nitrite: NEGATIVE
Protein, ur: NEGATIVE mg/dL
Specific Gravity, Urine: 1.025 (ref 1.005–1.030)
Urobilinogen, UA: 0.2 mg/dL (ref 0.0–1.0)

## 2012-07-09 LAB — COMPREHENSIVE METABOLIC PANEL
ALT: 11 U/L (ref 0–35)
AST: 11 U/L (ref 0–37)
Calcium: 9.7 mg/dL (ref 8.4–10.5)
Creatinine, Ser: 0.63 mg/dL (ref 0.50–1.10)
Sodium: 133 mEq/L — ABNORMAL LOW (ref 135–145)
Total Protein: 7.1 g/dL (ref 6.0–8.3)

## 2012-07-09 LAB — CBC
MCH: 25.8 pg — ABNORMAL LOW (ref 26.0–34.0)
MCHC: 33.9 g/dL (ref 30.0–36.0)
MCV: 76.2 fL — ABNORMAL LOW (ref 78.0–100.0)
Platelets: 269 10*3/uL (ref 150–400)
RDW: 14 % (ref 11.5–15.5)

## 2012-07-09 MED ORDER — PROMETHAZINE HCL 25 MG RE SUPP: 25.0000 mg | Freq: Four times a day (QID) | RECTAL | Status: DC | PRN

## 2012-07-09 MED ORDER — PROMETHAZINE HCL 25 MG RE SUPP
25.0000 mg | Freq: Four times a day (QID) | RECTAL | Status: DC | PRN
  Administered 2012-07-09: 25 mg via RECTAL
  Filled 2012-07-09: qty 1

## 2012-07-09 MED ORDER — ONDANSETRON 8 MG PO TBDP: 8.0000 mg | ORAL_TABLET | ORAL | Status: DC

## 2012-07-09 MED ORDER — KETOROLAC TROMETHAMINE 60 MG/2ML IM SOLN: 60.0000 mg | Freq: Once | INTRAMUSCULAR | Status: DC

## 2012-07-09 MED ORDER — PROMETHAZINE HCL 25 MG PO TABS
25.0000 mg | ORAL_TABLET | Freq: Once | ORAL | Status: AC
  Administered 2012-07-09: 25 mg via ORAL
  Filled 2012-07-09: qty 1

## 2012-07-09 NOTE — MAU Note (Signed)
Patient states she has had nausea and vomiting for about 2 weeks and abdominal pain for about one week. Denies any bleeding or vaginal discharge.

## 2012-07-09 NOTE — MAU Provider Note (Signed)
History     CSN: [id number]  Arrival date and time: 07/09/12 1355   First Provider Initiated Contact with Patient 07/09/12 1520      Chief Complaint  Patient presents with  . Emesis During Pregnancy  . Abdominal Pain   HPI Natasha Smith is 21 y.o. [phone] [redacted]w[redacted]d weeks presenting with nausea and vomiting.  Vomited X 5today.  Also spits. Denies vaginal bleeding or discharge.  She is a patient of Dr. Ebony Hail , has appt 1/15.  Has Zofran at home but not helping meds.      Past Medical History  Diagnosis Date  . Sickle cell trait   . Abnormal Pap smear   . History of chlamydia infection   . Pregnancy induced hypertension     hx    Past Surgical History  Procedure Date  . Knee surgery     Family History  Problem Relation Age of Onset  . Diabetes Maternal Grandmother   . Hypertension Mother   . Hypertension Father   . Heart disease Maternal Grandfather     History  Substance Use Topics  . Smoking status: Never Smoker   . Smokeless tobacco: Never Used  . Alcohol Use: No    Allergies: No Known Allergies  Prescriptions prior to admission  Medication Sig Dispense Refill  . acetaminophen (TYLENOL) 325 MG tablet Take 650 mg by mouth every 6 (six) hours as needed. For pain in stomach      . ibuprofen (ADVIL,MOTRIN) 200 MG tablet Take 600 mg by mouth every 6 (six) hours as needed. For pain in stomach      . metroNIDAZOLE (FLAGYL) 500 MG tablet Take 1 tablet (500 mg total) by mouth 2 (two) times daily.  10 tablet  1  . ondansetron (ZOFRAN) 4 MG tablet Take 1 tablet (4 mg total) by mouth every 6 (six) hours.  12 tablet  0  . Lidocaine HCl 0.5 % GEL Apply 0.5 inches topically 3 (three) times daily as needed.  237 g  0    Review of Systems  Constitutional: Negative.   Gastrointestinal: Positive for nausea and vomiting.  Genitourinary:       Neg for bleeding or discharge   Physical Exam   Blood pressure 120/66, pulse 75, temperature 97.7 F (36.5 C), temperature  source Oral, resp. rate 16, height 5\' 4"  (1.626 m), weight 225 lb 12.8 oz (102.422 kg), last menstrual period 05/22/2012, SpO2 100.00%.  Physical Exam  MAU Course  Procedures  MDM 15:54  Care turned over to [facility], [place]  Assessment and Plan    KEY,EVE M 07/09/2012, 3:22 PM   Results for orders placed during the hospital encounter of 07/09/12 (from the past 24 hour(s))  URINALYSIS, ROUTINE W REFLEX MICROSCOPIC     Status: Abnormal   Collection Time   07/09/12  2:55 PM      Component Value Range   Color, Urine YELLOW  YELLOW   APPearance CLEAR  CLEAR   Specific Gravity, Urine 1.025  1.005 - 1.030   pH 8.5 (*) 5.0 - 8.0   Glucose, UA NEGATIVE  NEGATIVE mg/dL   Hgb urine dipstick NEGATIVE  NEGATIVE   Bilirubin Urine NEGATIVE  NEGATIVE   Ketones, ur NEGATIVE  NEGATIVE mg/dL   Protein, ur NEGATIVE  NEGATIVE mg/dL   Urobilinogen, UA 0.2  0.0 - 1.0 mg/dL   Nitrite NEGATIVE  NEGATIVE   Leukocytes, UA NEGATIVE  NEGATIVE  CBC     Status: Abnormal   Collection Time  07/09/12  4:03 PM      Component Value Range   WBC 15.0 (*) 4.0 - 10.5 K/uL   RBC 5.00  3.87 - 5.11 MIL/uL   Hemoglobin 12.9  12.0 - 15.0 g/dL   HCT 08.6  57.8 - 46.9 %   MCV 76.2 (*) 78.0 - 100.0 fL   MCH 25.8 (*) 26.0 - 34.0 pg   MCHC 33.9  30.0 - 36.0 g/dL   RDW 62.9  52.8 - 41.3 %   Platelets 269  150 - 400 K/uL  COMPREHENSIVE METABOLIC PANEL     Status: Abnormal   Collection Time   07/09/12  4:03 PM      Component Value Range   Sodium 133 (*) 135 - 145 mEq/L   Potassium 3.9  3.5 - 5.1 mEq/L   Chloride 101  96 - 112 mEq/L   CO2 23  19 - 32 mEq/L   Glucose, Bld 102 (*) 70 - 99 mg/dL   BUN 6  6 - 23 mg/dL   Creatinine, Ser 2.44  0.50 - 1.10 mg/dL   Calcium 9.7  8.4 - 01.0 mg/dL   Total Protein 7.1  6.0 - 8.3 g/dL   Albumin 3.6  3.5 - 5.2 g/dL   AST 11  0 - 37 U/L   ALT 11  0 - 35 U/L   Alkaline Phosphatase 83  39 - 117 U/L   Total Bilirubin 0.5  0.3 - 1.2 mg/dL   GFR calc non Af Amer >90  >90 mL/min    GFR calc Af Amer >90  >90 mL/min   A: 1. Nausea/vomiting in pregnancy    P: Called Dr Jackelyn Knife to discuss assessment and findings Phenergan 25 mg rectal suppository x1 in MAU and prescription sent Continue Zofran PO as tolerated  D/C home  F/U in office Return to MAU as needed  Sharen Counter Certified Nurse-Midwife

## 2012-07-10 ENCOUNTER — Inpatient Hospital Stay (HOSPITAL_COMMUNITY)
Admission: AD | Admit: 2012-07-10 | Discharge: 2012-07-10 | Disposition: A | Discharge status: Home / Self Care | Payer: Medicaid Other | Source: Ambulatory Visit | Attending: Obstetrics and Gynecology | Admitting: Obstetrics and Gynecology

## 2012-07-10 ENCOUNTER — Encounter (HOSPITAL_COMMUNITY): Payer: Self-pay | Admitting: *Deleted

## 2012-07-10 DIAGNOSIS — O211 Hyperemesis gravidarum with metabolic disturbance: Secondary | ICD-10-CM | POA: Insufficient documentation

## 2012-07-10 DIAGNOSIS — E860 Dehydration: Secondary | ICD-10-CM | POA: Insufficient documentation

## 2012-07-10 DIAGNOSIS — R109 Unspecified abdominal pain: Secondary | ICD-10-CM | POA: Insufficient documentation

## 2012-07-10 DIAGNOSIS — O219 Vomiting of pregnancy, unspecified: Secondary | ICD-10-CM

## 2012-07-10 LAB — URINALYSIS, ROUTINE W REFLEX MICROSCOPIC
Hgb urine dipstick: NEGATIVE
Nitrite: NEGATIVE
Specific Gravity, Urine: <1.005 — ABNORMAL LOW (ref 1.005–1.030)
Urobilinogen, UA: 0.2 mg/dL (ref 0.0–1.0)
pH: 6 (ref 5.0–8.0)

## 2012-07-10 LAB — URINE MICROSCOPIC-ADD ON

## 2012-07-10 MED ORDER — ONDANSETRON HCL 4 MG/2ML IJ SOLN
4.0000 mg | Freq: Once | INTRAMUSCULAR | Status: AC
  Administered 2012-07-10: 4 mg via INTRAVENOUS
  Filled 2012-07-10: qty 2

## 2012-07-10 MED ORDER — ONDANSETRON 4 MG PO TBDP: 4.0000 mg | ORAL_TABLET | Freq: Four times a day (QID) | ORAL | Status: DC | PRN

## 2012-07-10 MED ORDER — PROMETHAZINE HCL 25 MG PO TABS: 25.0000 mg | ORAL_TABLET | Freq: Four times a day (QID) | ORAL | Status: DC | PRN

## 2012-07-10 MED ORDER — PROMETHAZINE HCL 25 MG/ML IJ SOLN
25.0000 mg | Freq: Once | INTRAVENOUS | Status: AC
  Administered 2012-07-10: 25 mg via INTRAVENOUS
  Filled 2012-07-10: qty 1

## 2012-07-10 MED ORDER — LACTATED RINGERS IV SOLN
Freq: Once | INTRAVENOUS | Status: AC
  Administered 2012-07-10: 05:00:00 via INTRAVENOUS

## 2012-07-10 NOTE — MAU Provider Note (Signed)
History     CSN: [id number]  Arrival date and time: 07/10/12 0243   None     Chief Complaint  Patient presents with  . Emesis During Pregnancy   HPI 21 y.o. [id number] at [redacted]w[redacted]d with nausea/vomiting for 2 weeks. Throwing up every 20 minutes. Feeling pain in lower abdomen for a few days, crampy/sharp. No bleeding. No vaginal discharge.   OB History    Grav Para Term Preterm Abortions TAB SAB Ect Mult Living   4 2 2  0 1 0 1 0 0 2      Past Medical History  Diagnosis Date  . Sickle cell trait   . Abnormal Pap smear   . History of chlamydia infection   . Pregnancy induced hypertension     hx    Past Surgical History  Procedure Date  . Knee surgery     Family History  Problem Relation Age of Onset  . Diabetes Maternal Grandmother   . Hypertension Mother   . Hypertension Father   . Heart disease Maternal Grandfather     History  Substance Use Topics  . Smoking status: Never Smoker   . Smokeless tobacco: Never Used  . Alcohol Use: No    Allergies: No Known Allergies  Prescriptions prior to admission  Medication Sig Dispense Refill  . acetaminophen (TYLENOL) 325 MG tablet Take 650 mg by mouth every 6 (six) hours as needed. For pain in stomach      . metroNIDAZOLE (FLAGYL) 500 MG tablet Take 1 tablet (500 mg total) by mouth 2 (two) times daily.  10 tablet  1  . ondansetron (ZOFRAN) 4 MG tablet Take 1 tablet (4 mg total) by mouth every 6 (six) hours.  12 tablet  0  . promethazine (PHENERGAN) 25 MG suppository Place 1 suppository (25 mg total) rectally every 6 (six) hours as needed.  12 each  2    Review of Systems  Constitutional: Positive for chills. Negative for fever.  Eyes: Negative for blurred vision and double vision.  Gastrointestinal: Positive for nausea, vomiting and abdominal pain. Negative for diarrhea and constipation.  Genitourinary: Positive for dysuria (pressure/pain today). Negative for urgency and frequency.  Neurological: Negative for headaches.     Physical Exam   Blood pressure 138/79, pulse 85, temperature 97.4 F (36.3 C), temperature source Oral, resp. rate 22, height 5\' 4"  (1.626 m), weight 101.152 kg (223 lb), last menstrual period 05/22/2012.  Physical Exam  Constitutional: She is oriented to person, place, and time. She appears well-developed and well-nourished. She appears distressed (mild distress due to nausea).  HENT:  Head: Normocephalic and atraumatic.       Mucous membranes dry  Eyes: Conjunctivae normal and EOM are normal.  Neck: Normal range of motion. Neck supple.  Cardiovascular: Normal rate, regular rhythm and normal heart sounds.   Respiratory: Effort normal and breath sounds normal. No respiratory distress.  GI: Soft. Bowel sounds are normal. There is tenderness (epigastric). There is no rebound and no guarding.  Musculoskeletal: Normal range of motion. She exhibits no edema and no tenderness.  Neurological: She is alert and oriented to person, place, and time.  Skin: Skin is warm and dry.  Psychiatric: She has a normal mood and affect.  \  MAU Course  Procedures  1 liter LR with phenergan (25 mg), 4 mg IV zofran - pt improved but still had not voided 2nd liter LR given  Results for orders placed during the hospital encounter of 07/10/12 (from the past  24 hour(s))  URINALYSIS, ROUTINE W REFLEX MICROSCOPIC     Status: Abnormal   Collection Time   07/10/12  6:10 AM      Component Value Range   Color, Urine YELLOW  YELLOW   APPearance TURBID (*) CLEAR   Specific Gravity, Urine <1.005 (*) 1.005 - 1.030   pH 6.0  5.0 - 8.0   Glucose, UA NEGATIVE  NEGATIVE mg/dL   Hgb urine dipstick NEGATIVE  NEGATIVE   Bilirubin Urine NEGATIVE  NEGATIVE   Ketones, ur 15 (*) NEGATIVE mg/dL   Protein, ur NEGATIVE  NEGATIVE mg/dL   Urobilinogen, UA 0.2  0.0 - 1.0 mg/dL   Nitrite NEGATIVE  NEGATIVE   Leukocytes, UA NEGATIVE  NEGATIVE  URINE MICROSCOPIC-ADD ON     Status: Abnormal   Collection Time   07/10/12  6:10  AM      Component Value Range   Squamous Epithelial / LPF MANY (*) RARE   RBC / HPF 0-2  <3 RBC/hpf    Assessment and Plan  21 y.o. [id number] at [redacted]w[redacted]d with  - Nausea/vomiting with dehydration- s/p 2 liters fluid. Urine clear. - Discharge home:  zofran ODT and phenergan rx given\-   Discussed with Dr. Jackelyn Knife.   Napoleon Form 07/10/2012, 6:49 AM

## 2012-07-10 NOTE — MAU Note (Signed)
Pt states she was seen earlier today in MAU for vomiting, states it has worsened.

## 2012-07-21 ENCOUNTER — Encounter (HOSPITAL_COMMUNITY): Payer: Self-pay | Admitting: Advanced Practice Midwife

## 2012-07-21 ENCOUNTER — Inpatient Hospital Stay (HOSPITAL_COMMUNITY)
Admission: AD | Admit: 2012-07-21 | Discharge: 2012-07-21 | Disposition: A | Discharge status: Home / Self Care | Payer: Medicaid Other | Source: Ambulatory Visit | Attending: Obstetrics and Gynecology | Admitting: Obstetrics and Gynecology

## 2012-07-21 DIAGNOSIS — O219 Vomiting of pregnancy, unspecified: Secondary | ICD-10-CM

## 2012-07-21 DIAGNOSIS — O210 Mild hyperemesis gravidarum: Secondary | ICD-10-CM | POA: Insufficient documentation

## 2012-07-21 DIAGNOSIS — E876 Hypokalemia: Secondary | ICD-10-CM | POA: Insufficient documentation

## 2012-07-21 HISTORY — DX: Herpesviral infection, unspecified: B00.9

## 2012-07-21 LAB — COMPREHENSIVE METABOLIC PANEL
ALT: 9 U/L (ref 0–35)
Albumin: 3.1 g/dL — ABNORMAL LOW (ref 3.5–5.2)
Alkaline Phosphatase: 71 U/L (ref 39–117)
Chloride: 100 mEq/L (ref 96–112)
Potassium: 3.2 mEq/L — ABNORMAL LOW (ref 3.5–5.1)
Sodium: 135 mEq/L (ref 135–145)
Total Protein: 6.1 g/dL (ref 6.0–8.3)

## 2012-07-21 LAB — CBC
Hemoglobin: 12.3 g/dL (ref 12.0–15.0)
MCHC: 34.5 g/dL (ref 30.0–36.0)
RDW: 13.8 % (ref 11.5–15.5)
WBC: 13.2 10*3/uL — ABNORMAL HIGH (ref 4.0–10.5)

## 2012-07-21 MED ORDER — PROMETHAZINE HCL 25 MG/ML IJ SOLN
25.0000 mg | Freq: Once | INTRAVENOUS | Status: AC
  Administered 2012-07-21: 25 mg via INTRAVENOUS
  Filled 2012-07-21: qty 1

## 2012-07-21 MED ORDER — LACTATED RINGERS IV BOLUS (SEPSIS)
1000.0000 mL | Freq: Once | INTRAVENOUS | Status: AC
  Administered 2012-07-21: 1000 mL via INTRAVENOUS

## 2012-07-21 MED ORDER — ONDANSETRON HCL 4 MG/2ML IJ SOLN
4.0000 mg | Freq: Once | INTRAMUSCULAR | Status: AC
  Administered 2012-07-21: 4 mg via INTRAVENOUS
  Filled 2012-07-21: qty 2

## 2012-07-21 MED ORDER — PANTOPRAZOLE SODIUM 40 MG IV SOLR
40.0000 mg | Freq: Once | INTRAVENOUS | Status: AC
  Administered 2012-07-21: 40 mg via INTRAVENOUS
  Filled 2012-07-21: qty 40

## 2012-07-21 NOTE — MAU Provider Note (Signed)
Chief Complaint: Hyperemesis Gravidarum   First Provider Initiated Contact with Patient 07/21/12 0331     SUBJECTIVE HPI: Natasha Smith is a 22 y.o. [id number] at [redacted]w[redacted]d by LMP who presents with worsening of N/V of pregnancy. Has been taking Zofran and phenergan tablets w/ no relief. Has probably not been using Phenergan suppositories. No urine output x 24 hours. Has not been able to keep anything down x a few days. Also reports pain in upper abd/low mid chest during and for a few minutes after vomiting. None now. Denies fever, chills, sick contacts, SOB. 8 lb wt loss since MAU visit 07/09/12.  Past Medical History  Diagnosis Date  . Sickle cell trait   . Abnormal Pap smear   . History of chlamydia infection   . Pregnancy induced hypertension     hx  . Herpes simplex type II infection    OB History    Grav Para Term Preterm Abortions TAB SAB Ect Mult Living   4 2 2  0 1 0 1 0 0 2     # Outc Date GA Lbr Len/2nd Wgt Sex Del Anes PTL Lv   1 TRM 2008 [redacted]w[redacted]d  8lb11oz(3.941kg) M SVD EPI  Yes   2 SAB 2011 [redacted]w[redacted]d          3 TRM 9/12 [redacted]w[redacted]d 10:50 / 00:12 8lb0.4oz(3.64kg) M SVD EPI  Yes   Comments: none noted   4 CUR              Past Surgical History  Procedure Date  . Knee surgery    History   Social History  . Marital Status: Single    Spouse Name: N/A    Number of Children: N/A  . Years of Education: N/A   Occupational History  . Not on file.   Social History Main Topics  . Smoking status: Never Smoker   . Smokeless tobacco: Never Used  . Alcohol Use: No  . Drug Use: No  . Sexually Active: Yes    Birth Control/ Protection: None   Other Topics Concern  . Not on file   Social History Narrative  . No narrative on file   No current facility-administered medications on file prior to encounter.   Current Outpatient Prescriptions on File Prior to Encounter  Medication Sig Dispense Refill  . metroNIDAZOLE (FLAGYL) 500 MG tablet Take 1 tablet (500 mg total) by mouth 2 (two) times  daily.  10 tablet  1  . ondansetron (ZOFRAN ODT) 4 MG disintegrating tablet Take 1 tablet (4 mg total) by mouth every 6 (six) hours as needed for nausea.  30 tablet  0  . promethazine (PHENERGAN) 25 MG suppository Place 1 suppository (25 mg total) rectally every 6 (six) hours as needed.  12 each  2  . promethazine (PHENERGAN) 25 MG tablet Take 1 tablet (25 mg total) by mouth every 6 (six) hours as needed for nausea.  30 tablet  2  . acetaminophen (TYLENOL) 325 MG tablet Take 650 mg by mouth every 6 (six) hours as needed. For pain in stomach      . ondansetron (ZOFRAN) 4 MG tablet Take 1 tablet (4 mg total) by mouth every 6 (six) hours.  12 tablet  0   No Known Allergies  ROS: Pertinent items in HPI  OBJECTIVE Blood pressure 137/95, pulse 114, temperature 98.2 F (36.8 C), temperature source Oral, resp. rate 22, height 5\' 4"  (1.626 m), weight 98.431 kg (217 lb), last menstrual period 05/22/2012, SpO2  100.00%,. Patient Vitals for the past 24 hrs:  BP Temp Temp src Pulse Resp SpO2 Height Weight  07/21/12 0322 132/73 mmHg - - 87  - 100 % - -  07/21/12 0258 137/95 mmHg 98.2 F (36.8 C) Oral 114  22  100 % 5\' 4"  (1.626 m) 98.431 kg (217 lb)  07/21/12 0257 - - - - - 100 % - -  07/21/12 0254 - - - - - - 5\' 4"  (1.626 m) 98.431 kg (217 lb)   GENERAL: Well-developed, well-nourished female in no acute distress.  HEENT: Normocephalic, mucus membranes moist. HEART: normal rate RESP: normal effort ABDOMEN: Soft, non-tender EXTREMITIES: Nontender, no edema NEURO: Alert and oriented SPECULUM EXAM: deferred  LAB RESULTS Results for orders placed during the hospital encounter of 07/21/12 (from the past 24 hour(s))  CBC     Status: Abnormal   Collection Time   07/21/12  4:36 AM      Component Value Range   WBC 13.2 (*) 4.0 - 10.5 K/uL   RBC 4.68  3.87 - 5.11 MIL/uL   Hemoglobin 12.3  12.0 - 15.0 g/dL   HCT 16.1 (*) 09.6 - 04.5 %   MCV 76.3 (*) 78.0 - 100.0 fL   MCH 26.3  26.0 - 34.0 pg   MCHC  34.5  30.0 - 36.0 g/dL   RDW 40.9  81.1 - 91.4 %   Platelets 208  150 - 400 K/uL  COMPREHENSIVE METABOLIC PANEL     Status: Abnormal   Collection Time   07/21/12  4:36 AM      Component Value Range   Sodium 135  135 - 145 mEq/L   Potassium 3.2 (*) 3.5 - 5.1 mEq/L   Chloride 100  96 - 112 mEq/L   CO2 25  19 - 32 mEq/L   Glucose, Bld 128 (*) 70 - 99 mg/dL   BUN 8  6 - 23 mg/dL   Creatinine, Ser 7.82  0.50 - 1.10 mg/dL   Calcium 9.5  8.4 - 95.6 mg/dL   Total Protein 6.1  6.0 - 8.3 g/dL   Albumin 3.1 (*) 3.5 - 5.2 g/dL   AST 9  0 - 37 U/L   ALT 9  0 - 35 U/L   Alkaline Phosphatase 71  39 - 117 U/L   Total Bilirubin 0.8  0.3 - 1.2 mg/dL   GFR calc non Af Amer >90  >90 mL/min   GFR calc Af Amer >90  >90 mL/min    IMAGING NA  MAU COURSE Per consult w/ D. Henley give IV fluids and Phenergan and check electrolytes. D/C home when tolerating POs and if electrolytes normal.   ASSESSMENT 1. Nausea and vomiting of pregnancy, antepartum   Mild hypokalemia. Asymptomatic.   PLAN Discharge home Advance diet slowly. May use phenergan suppositories vaginally.      Follow-up Information    Follow up with Bing Plume, MD. On 07/25/2012.   Contact information:   [address], [address], [address] [place] [address] [phone]       Follow up with [facility]  MATERNITY ADMISSIONS. (As needed if symptoms worsen)    Contact information:   [address] [id number] mc [place] [address] [phone]          Medication List     As of 07/21/2012  5:25 AM    TAKE these medications         acetaminophen 325  MG tablet   Commonly known as: TYLENOL   Take 650 mg by mouth every 6 (six) hours as needed. For pain in stomach      metroNIDAZOLE 500 MG tablet   Commonly known as: FLAGYL   Take 1 tablet (500 mg total) by mouth 2 (two) times daily.      ondansetron 4 MG disintegrating tablet   Commonly  known as: ZOFRAN-ODT   Take 1 tablet (4 mg total) by mouth every 6 (six) hours as needed for nausea.      ondansetron 4 MG tablet   Commonly known as: ZOFRAN   Take 1 tablet (4 mg total) by mouth every 6 (six) hours.      promethazine 25 MG suppository   Commonly known as: PHENERGAN   Place 1 suppository (25 mg total) rectally every 6 (six) hours as needed.      promethazine 25 MG tablet   Commonly known as: PHENERGAN   Take 1 tablet (25 mg total) by mouth every 6 (six) hours as needed for nausea.          [place], CNM 07/21/2012  5:25 AM

## 2012-07-21 NOTE — MAU Note (Signed)
Pt states that she has vomited through the entire pregnancy-but it has gotten worse and worse-states has not urinated in the past 24 hours

## 2012-07-21 NOTE — Progress Notes (Signed)
Written and verbal d/c instructions given and understanding voiced. 

## 2012-07-25 ENCOUNTER — Ambulatory Visit (INDEPENDENT_AMBULATORY_CARE_PROVIDER_SITE_OTHER): Payer: Medicaid Other | Admitting: Family Medicine

## 2012-07-25 ENCOUNTER — Encounter: Payer: Self-pay | Admitting: Family Medicine

## 2012-07-25 VITALS — BP 141/96 | HR 100 | Temp 97.7°F | Wt 215.7 lb

## 2012-07-25 DIAGNOSIS — Z3201 Encounter for pregnancy test, result positive: Secondary | ICD-10-CM

## 2012-07-25 DIAGNOSIS — B009 Herpesviral infection, unspecified: Secondary | ICD-10-CM

## 2012-07-25 MED ORDER — ACYCLOVIR 200 MG PO CAPS: 200.0000 mg | ORAL_CAPSULE | Freq: Every day | ORAL | Status: DC

## 2012-07-25 NOTE — Patient Instructions (Addendum)
We discussed risks/benefits of treatment. Please make appointment with DR. De la cruz in next 2 weeks or sooner if symptoms worsen.

## 2012-07-26 NOTE — Assessment & Plan Note (Signed)
Patient considering termination of twin gestation. She reports having a dating [place] recently, but likely nearing the gestational limitation so I advised her to seek out options soon if desired. I provided resource list for [facility].

## 2012-07-26 NOTE — Progress Notes (Signed)
  Subjective:    Patient ID: Natasha Smith, female    DOB: [birth year], 22 y.o.   MRN: [id number]  HPI  1. Genital herpes. Noticed a lesion, burning on left labia 2 days ago. Desires treatment. Newly diagnosed within the past year and last outbreak was 2 months ago. She recently discovered she is [redacted] weeks pregnant with twins, which is unplanned. She is considering termination of pregnancy.  Monogamous with her long time boyfriend.  Review of Systems Denies fever, chills, malaise, vaginal discharge, dysuria, abdominal pain, bleeding, edema, oral lesions.    Objective:   Physical Exam  Vitals reviewed. Constitutional: She appears well-developed and well-nourished. No distress.  HENT:  Head: Normocephalic and atraumatic.       No mucosal lesions noted.  Pulmonary/Chest: Effort normal.  Genitourinary: No vaginal discharge found.       Left labia majora has single shallow ulcer at 3 oclock position. No bleeding or significant LAD noted. No mucosal lesions or labia minora involvement.  Psychiatric: She has a normal mood and affect.          Assessment & Plan:

## 2012-07-26 NOTE — Assessment & Plan Note (Signed)
Recurrent outbreak within 2 days of onset. Will treat for 5 days. Discussed risks/benefits of treatment during pregnancy, rare risk of transmission vs teratogenic though pregnancy class B drug. She elects to treat. Advised to f/u with PCP in 1-2 weeks if not improving or to consider prophylactic treatment if these recurrences continue.

## 2012-08-03 ENCOUNTER — Encounter (HOSPITAL_COMMUNITY): Payer: Self-pay | Admitting: *Deleted

## 2012-08-03 ENCOUNTER — Inpatient Hospital Stay (HOSPITAL_COMMUNITY)
Admission: AD | Admit: 2012-08-03 | Discharge: 2012-08-03 | Disposition: A | Discharge status: Home / Self Care | Payer: Medicaid Other | Source: Ambulatory Visit | Attending: Obstetrics and Gynecology | Admitting: Obstetrics and Gynecology

## 2012-08-03 DIAGNOSIS — O210 Mild hyperemesis gravidarum: Secondary | ICD-10-CM | POA: Insufficient documentation

## 2012-08-03 DIAGNOSIS — R1084 Generalized abdominal pain: Secondary | ICD-10-CM

## 2012-08-03 DIAGNOSIS — R109 Unspecified abdominal pain: Secondary | ICD-10-CM | POA: Insufficient documentation

## 2012-08-03 DIAGNOSIS — O219 Vomiting of pregnancy, unspecified: Secondary | ICD-10-CM

## 2012-08-03 LAB — URINALYSIS, ROUTINE W REFLEX MICROSCOPIC
Glucose, UA: NEGATIVE mg/dL
Ketones, ur: 40 mg/dL — AB
pH: 5.5 (ref 5.0–8.0)

## 2012-08-03 LAB — CBC
Hemoglobin: 12.6 g/dL (ref 12.0–15.0)
MCHC: 35 g/dL (ref 30.0–36.0)
RBC: 4.76 MIL/uL (ref 3.87–5.11)
WBC: 10.9 10*3/uL — ABNORMAL HIGH (ref 4.0–10.5)

## 2012-08-03 LAB — COMPREHENSIVE METABOLIC PANEL
ALT: 19 U/L (ref 0–35)
Alkaline Phosphatase: 66 U/L (ref 39–117)
CO2: 23 mEq/L (ref 19–32)
GFR calc Af Amer: >90 mL/min (ref >90)
Glucose, Bld: 165 mg/dL — ABNORMAL HIGH (ref 70–99)
Potassium: 3.4 mEq/L — ABNORMAL LOW (ref 3.5–5.1)
Sodium: 133 mEq/L — ABNORMAL LOW (ref 135–145)
Total Protein: 6.9 g/dL (ref 6.0–8.3)

## 2012-08-03 LAB — URINE MICROSCOPIC-ADD ON

## 2012-08-03 MED ORDER — ONDANSETRON HCL 4 MG/2ML IJ SOLN
4.0000 mg | INTRAMUSCULAR | Status: AC
  Administered 2012-08-03: 4 mg via INTRAVENOUS
  Filled 2012-08-03: qty 2

## 2012-08-03 MED ORDER — PROMETHAZINE HCL 25 MG PO TABS
12.5000 mg | ORAL_TABLET | Freq: Four times a day (QID) | ORAL | Status: DC | PRN
Start: 2012-08-03 — End: 2012-08-05

## 2012-08-03 MED ORDER — FAMOTIDINE IN NACL 20-0.9 MG/50ML-% IV SOLN
20.0000 mg | INTRAVENOUS | Status: AC
  Administered 2012-08-03: 20 mg via INTRAVENOUS
  Filled 2012-08-03: qty 50

## 2012-08-03 MED ORDER — PROMETHAZINE HCL 25 MG/ML IJ SOLN
25.0000 mg | Freq: Once | INTRAVENOUS | Status: AC
  Administered 2012-08-03: 25 mg via INTRAVENOUS
  Filled 2012-08-03: qty 1

## 2012-08-03 MED ORDER — RANITIDINE HCL 150 MG PO TABS: 150.0000 mg | ORAL_TABLET | Freq: Two times a day (BID) | ORAL | Status: DC

## 2012-08-03 MED ORDER — PROMETHAZINE HCL 25 MG RE SUPP: 25.0000 mg | Freq: Four times a day (QID) | RECTAL | Status: DC | PRN

## 2012-08-03 NOTE — MAU Note (Signed)
Pt reports vomiting continues, pain in lower abd and lower back.

## 2012-08-03 NOTE — MAU Provider Note (Signed)
Chief Complaint: Abdominal Pain   None    SUBJECTIVE HPI: Natasha Smith is a 22 y.o. [phone] at [redacted]w[redacted]d by LMP who presents to maternity admissions reporting vomiting 6-7 x/day and she has been unable to keep down any food or fluids for 3 days.  This is not a new problem and she has prescriptions for Diglegis and Zofran with her today but reports she cannot keep these down.  She has an appointment in February to see Dr Ambrose Mantle.  She reports pain in her upper abdomen and burning epigastric pain with vomiting.  She denies cramping, vaginal bleeding, urinary symptoms, h/a, vaginal itching/burning, or fever/chills.     Past Medical History  Diagnosis Date  . Sickle cell trait   . Abnormal Pap smear   . History of chlamydia infection   . Pregnancy induced hypertension     hx  . Herpes simplex type II infection    Past Surgical History  Procedure Date  . Knee surgery    History   Social History  . Marital Status: Single    Spouse Name: N/A    Number of Children: N/A  . Years of Education: N/A   Occupational History  . Not on file.   Social History Main Topics  . Smoking status: Never Smoker   . Smokeless tobacco: Never Used  . Alcohol Use: No  . Drug Use: No  . Sexually Active: Yes    Birth Control/ Protection: None   Other Topics Concern  . Not on file   Social History Narrative  . No narrative on file   No current facility-administered medications on file prior to encounter.   Current Outpatient Prescriptions on File Prior to Encounter  Medication Sig Dispense Refill  . acetaminophen (TYLENOL) 325 MG tablet Take 650 mg by mouth every 6 (six) hours as needed. For pain in stomach      . acyclovir (ZOVIRAX) 200 MG capsule Take 1 capsule (200 mg total) by mouth 5 (five) times daily.  25 capsule  0  . metroNIDAZOLE (FLAGYL) 500 MG tablet Take 1 tablet (500 mg total) by mouth 2 (two) times daily.  10 tablet  1  . ondansetron (ZOFRAN ODT) 4 MG disintegrating tablet Take 1  tablet (4 mg total) by mouth every 6 (six) hours as needed for nausea.  30 tablet  0  . ondansetron (ZOFRAN) 4 MG tablet Take 1 tablet (4 mg total) by mouth every 6 (six) hours.  12 tablet  0  . promethazine (PHENERGAN) 25 MG suppository Place 1 suppository (25 mg total) rectally every 6 (six) hours as needed.  12 each  2  . promethazine (PHENERGAN) 25 MG tablet Take 1 tablet (25 mg total) by mouth every 6 (six) hours as needed for nausea.  30 tablet  2   No Known Allergies  ROS: Pertinent items in HPI  OBJECTIVE Blood pressure 140/102, pulse 111, temperature 97.7 F (36.5 C), temperature source Oral, resp. rate 20, height 5\' 4"  (1.626 m), weight 94.348 kg (208 lb), last menstrual period 05/22/2012, SpO2 100.00%. Patient Vitals for the past 24 hrs:  BP Temp Temp src Pulse Resp SpO2 Height Weight  08/03/12 0120 127/78 mmHg - - 103  18  - - -  08/03/12 0023 140/102 mmHg 97.7 F (36.5 C) Oral 111  20  100 % 5\' 4"  (1.626 m) 94.348 kg (208 lb)   GENERAL: Well-developed, well-nourished female in no acute distress.  HEENT: Normocephalic HEART: normal rate RESP: normal effort  ABDOMEN: Soft, non-tender EXTREMITIES: Nontender, no edema NEURO: Alert and oriented SPECULUM EXAM: Deferred  LAB RESULTS Results for orders placed during the hospital encounter of 08/03/12 (from the past 24 hour(s))  URINALYSIS, ROUTINE W REFLEX MICROSCOPIC     Status: Abnormal   Collection Time   08/03/12 12:25 AM      Component Value Range   Color, Urine YELLOW  YELLOW   APPearance CLEAR  CLEAR   Specific Gravity, Urine >1.030 (*) 1.005 - 1.030   pH 5.5  5.0 - 8.0   Glucose, UA NEGATIVE  NEGATIVE mg/dL   Hgb urine dipstick MODERATE (*) NEGATIVE   Bilirubin Urine SMALL (*) NEGATIVE   Ketones, ur 40 (*) NEGATIVE mg/dL   Protein, ur NEGATIVE  NEGATIVE mg/dL   Urobilinogen, UA 0.2  0.0 - 1.0 mg/dL   Nitrite NEGATIVE  NEGATIVE   Leukocytes, UA NEGATIVE  NEGATIVE  URINE MICROSCOPIC-ADD ON     Status: Abnormal    Collection Time   08/03/12 12:25 AM      Component Value Range   Squamous Epithelial / LPF RARE  RARE   WBC, UA 0-2  <3 WBC/hpf   RBC / HPF 0-2  <3 RBC/hpf   Bacteria, UA FEW (*) RARE     ASSESSMENT 1. Nausea/vomiting in pregnancy     PLAN Called Dr Ellyn Hack to discuss assessment and findings Discharge home Phenergan 12.5 mg PO Q 6 hours PRN Zantac 150 mg PO BID  Phenergan 25 mg suppositories when not tolerating PO Keep scheduled appointment with Dr Ambrose Mantle Return to MAU as needed    Medication List     As of 08/03/2012 12:56 AM    ASK your doctor about these medications         acetaminophen 325 MG tablet   Commonly known as: TYLENOL   Take 650 mg by mouth every 6 (six) hours as needed. For pain in stomach      acyclovir 200 MG capsule   Commonly known as: ZOVIRAX   Take 1 capsule (200 mg total) by mouth 5 (five) times daily.      metroNIDAZOLE 500 MG tablet   Commonly known as: FLAGYL   Take 1 tablet (500 mg total) by mouth 2 (two) times daily.      ondansetron 4 MG disintegrating tablet   Commonly known as: ZOFRAN-ODT   Take 1 tablet (4 mg total) by mouth every 6 (six) hours as needed for nausea.      ondansetron 4 MG tablet   Commonly known as: ZOFRAN   Take 1 tablet (4 mg total) by mouth every 6 (six) hours.      promethazine 25 MG suppository   Commonly known as: PHENERGAN   Place 1 suppository (25 mg total) rectally every 6 (six) hours as needed.      promethazine 25 MG tablet   Commonly known as: PHENERGAN   Take 1 tablet (25 mg total) by mouth every 6 (six) hours as needed for nausea.         Sharen Counter Certified Nurse-Midwife 08/03/2012  12:56 AM

## 2012-08-05 ENCOUNTER — Inpatient Hospital Stay (HOSPITAL_COMMUNITY)
Admission: AD | Admit: 2012-08-05 | Discharge: 2012-08-05 | Disposition: A | Discharge status: Home / Self Care | Payer: Medicaid Other | Source: Ambulatory Visit | Attending: Obstetrics and Gynecology | Admitting: Obstetrics and Gynecology

## 2012-08-05 ENCOUNTER — Encounter (HOSPITAL_COMMUNITY): Payer: Self-pay | Admitting: Family

## 2012-08-05 DIAGNOSIS — O210 Mild hyperemesis gravidarum: Secondary | ICD-10-CM | POA: Insufficient documentation

## 2012-08-05 DIAGNOSIS — O2340 Unspecified infection of urinary tract in pregnancy, unspecified trimester: Secondary | ICD-10-CM

## 2012-08-05 DIAGNOSIS — O219 Vomiting of pregnancy, unspecified: Secondary | ICD-10-CM

## 2012-08-05 LAB — COMPREHENSIVE METABOLIC PANEL
AST: 15 U/L (ref 0–37)
CO2: 17 mEq/L — ABNORMAL LOW (ref 19–32)
Calcium: 9.9 mg/dL (ref 8.4–10.5)
Creatinine, Ser: 0.52 mg/dL (ref 0.50–1.10)
GFR calc Af Amer: >90 mL/min (ref >90)
GFR calc non Af Amer: >90 mL/min (ref >90)

## 2012-08-05 MED ORDER — METHYLPREDNISOLONE 16 MG PO TABS: ORAL_TABLET | ORAL | Status: DC

## 2012-08-05 MED ORDER — METHYLPREDNISOLONE 8 MG PO TABS: ORAL_TABLET | ORAL | Status: DC

## 2012-08-05 MED ORDER — LACTATED RINGERS IV BOLUS (SEPSIS)
1000.0000 mL | Freq: Once | INTRAVENOUS | Status: AC
  Administered 2012-08-05: 1000 mL via INTRAVENOUS

## 2012-08-05 MED ORDER — ONDANSETRON HCL 4 MG/2ML IJ SOLN
4.0000 mg | INTRAMUSCULAR | Status: AC
  Administered 2012-08-05: 4 mg via INTRAVENOUS
  Filled 2012-08-05: qty 2

## 2012-08-05 MED ORDER — SODIUM CHLORIDE 0.9 % IJ SOLN
INTRAMUSCULAR | Status: AC
  Filled 2012-08-05: qty 3

## 2012-08-05 MED ORDER — PROMETHAZINE HCL 25 MG/ML IJ SOLN
25.0000 mg | Freq: Once | INTRAMUSCULAR | Status: AC
  Administered 2012-08-05: 25 mg via INTRAVENOUS
  Filled 2012-08-05: qty 1

## 2012-08-05 MED ORDER — METHYLPREDNISOLONE 4 MG PO TABS: ORAL_TABLET | ORAL | Status: DC

## 2012-08-05 MED ORDER — DEXTROSE 5 % IN LACTATED RINGERS IV BOLUS
1000.0000 mL | INTRAVENOUS | Status: AC
  Administered 2012-08-05: 1000 mL via INTRAVENOUS

## 2012-08-05 NOTE — MAU Provider Note (Signed)
History     CSN: [id number]  Arrival date and time: 08/05/12 2130   First Provider Initiated Contact with Patient 08/05/12 [phone]      Chief Complaint  Patient presents with  . Hyperemesis Gravidarum   HPI  Pt is a G4P2012 at 10.5 wk IUP here with report of vomiting that is not responding to at home PO medications.  Report vomiting at least 30x today.  Unable to hold down food or water.  No report of fever or body aches.    Past Medical History  Diagnosis Date  . Sickle cell trait   . Abnormal Pap smear   . History of chlamydia infection   . Pregnancy induced hypertension     hx  . Herpes simplex type II infection     Past Surgical History  Procedure Date  . Knee surgery     Family History  Problem Relation Age of Onset  . Diabetes Maternal Grandmother   . Hypertension Mother   . Hypertension Father   . Heart disease Maternal Grandfather     History  Substance Use Topics  . Smoking status: Never Smoker   . Smokeless tobacco: Never Used  . Alcohol Use: No    Allergies: No Known Allergies  Prescriptions prior to admission  Medication Sig Dispense Refill  . acetaminophen (TYLENOL) 325 MG tablet Take 650 mg by mouth every 6 (six) hours as needed. For pain in stomach      . acyclovir (ZOVIRAX) 200 MG capsule Take 1 capsule (200 mg total) by mouth 5 (five) times daily.  25 capsule  0  . Doxylamine-Pyridoxine (DICLEGIS) 10-10 MG TBEC Take by mouth.      . ondansetron (ZOFRAN ODT) 4 MG disintegrating tablet Take 1 tablet (4 mg total) by mouth every 6 (six) hours as needed for nausea.  30 tablet  0  . ondansetron (ZOFRAN) 4 MG tablet Take 1 tablet (4 mg total) by mouth every 6 (six) hours.  12 tablet  0  . promethazine (PHENERGAN) 25 MG suppository Place 1 suppository (25 mg total) rectally every 6 (six) hours as needed.  12 each  2  . promethazine (PHENERGAN) 25 MG tablet Take 1 tablet (25 mg total) by mouth every 6 (six) hours as needed for nausea.  30 tablet  2  .  promethazine (PHENERGAN) 25 MG tablet Take 0.5 tablets (12.5 mg total) by mouth every 6 (six) hours as needed for nausea.  30 tablet  2  . ranitidine (ZANTAC) 150 MG tablet Take 1 tablet (150 mg total) by mouth 2 (two) times daily.  30 tablet  2    Review of Systems  Gastrointestinal: Positive for nausea and vomiting. Negative for diarrhea.  Musculoskeletal: Positive for back pain (left flank).  All other systems reviewed and are negative.   Physical Exam   Blood pressure 135/82, pulse 108, temperature 97.2 F (36.2 C), temperature source Oral, resp. rate 18, last menstrual period 05/22/2012.  Physical Exam  Constitutional: She is oriented to person, place, and time. She appears well-developed and well-nourished.  HENT:  Head: Normocephalic.  Mouth/Throat: Mucous membranes are dry.  Neck: Normal range of motion. Neck supple.  Cardiovascular: Normal rate, regular rhythm and normal heart sounds.   Respiratory: Effort normal and breath sounds normal.  GI: There is no tenderness.  Genitourinary: No bleeding around the vagina. No vaginal discharge found.  Neurological: She is alert and oriented to person, place, and time. She has normal reflexes.  Skin: Skin is  warm and dry. She is not diaphoretic.    MAU Course  Procedures  Results for orders placed during the hospital encounter of 08/05/12 (from the past 24 hour(s))  COMPREHENSIVE METABOLIC PANEL     Status: Abnormal   Collection Time   08/05/12  2:25 AM      Component Value Range   Sodium 135  135 - 145 mEq/L   Potassium 3.2 (*) 3.5 - 5.1 mEq/L   Chloride 100  96 - 112 mEq/L   CO2 17 (*) 19 - 32 mEq/L   Glucose, Bld 90  70 - 99 mg/dL   BUN 6  6 - 23 mg/dL   Creatinine, Ser 4.09  0.50 - 1.10 mg/dL   Calcium 9.9  8.4 - 81.1 mg/dL   Total Protein 6.6  6.0 - 8.3 g/dL   Albumin 3.7  3.5 - 5.2 g/dL   AST 15  0 - 37 U/L   ALT 23  0 - 35 U/L   Alkaline Phosphatase 68  39 - 117 U/L   Total Bilirubin 0.9  0.3 - 1.2 mg/dL   GFR  calc non Af Amer >90  >90 mL/min   GFR calc Af Amer >90  >90 mL/min   Unable to urinate  1 L of LR with 25 mg IV Phernergan  Pt reports slightly improved; concerned vomiting will resume once discharge.  Consulted with Dr. Senaida Ores > give 1 L of D5LR and add 4 mg Zofran > DC home with ODT Zofran and prednisone dose pack.    Assessment and Plan  Hyperemesis of Pregnancy  Plan: Call Dr. Senaida Ores after 2nd liter of fluid for status of pt.    [facility] 08/05/2012, 2:20 AM

## 2012-08-05 NOTE — MAU Note (Signed)
Pt tolerating ginger ale 

## 2012-08-05 NOTE — MAU Note (Signed)
Patient unable to keep anything down for weeks, was seen on Friday in MAU for the same, Phenergan.

## 2012-08-08 ENCOUNTER — Inpatient Hospital Stay (HOSPITAL_COMMUNITY)
Admission: AD | Admit: 2012-08-08 | Discharge: 2012-08-08 | Disposition: A | Discharge status: Home / Self Care | Payer: Medicaid Other | Source: Ambulatory Visit | Attending: Obstetrics and Gynecology | Admitting: Obstetrics and Gynecology

## 2012-08-08 ENCOUNTER — Encounter (HOSPITAL_COMMUNITY): Payer: Self-pay

## 2012-08-08 DIAGNOSIS — O210 Mild hyperemesis gravidarum: Secondary | ICD-10-CM | POA: Insufficient documentation

## 2012-08-08 DIAGNOSIS — O30009 Twin pregnancy, unspecified number of placenta and unspecified number of amniotic sacs, unspecified trimester: Secondary | ICD-10-CM | POA: Insufficient documentation

## 2012-08-08 DIAGNOSIS — K529 Noninfective gastroenteritis and colitis, unspecified: Secondary | ICD-10-CM

## 2012-08-08 MED ORDER — KCL-LACTATED RINGERS 20 MEQ/L IV SOLN
INTRAVENOUS | Status: DC
  Administered 2012-08-08: 19:00:00 via INTRAVENOUS
  Filled 2012-08-08 (×6): qty 1000

## 2012-08-08 MED ORDER — M.V.I. ADULT IV INJ
10.0000 mL | INJECTION | Freq: Once | INTRAVENOUS | Status: AC
  Administered 2012-08-08: 10 mL via INTRAVENOUS
  Filled 2012-08-08: qty 10

## 2012-08-08 MED ORDER — KCL-LACTATED RINGERS 20 MEQ/L IV SOLN: Freq: Once | INTRAVENOUS | Status: DC

## 2012-08-08 MED ORDER — GI COCKTAIL ~~LOC~~
30.0000 mL | Freq: Once | ORAL | Status: AC
  Administered 2012-08-08: 30 mL via ORAL
  Filled 2012-08-08: qty 30

## 2012-08-08 NOTE — MAU Provider Note (Signed)
History     CSN: [id number]  Arrival date and time: 08/08/12 1713   First Provider Initiated Contact with Patient 08/08/12 1724      Chief Complaint  Patient presents with  . Morning Sickness   HPI This is a 22 y.o. female at [redacted]w[redacted]d who presents with c/o acid reflux irritation and nausea/vomiting. Admits she ran out of Zofran and does not remember last time she took it. Never filled Steroid Rx. Took one phenergan yesterday. Took Zantac today once. Also developed diarrhea today. No fever.   RN Note: Pt states had been on many different medications for n/v/d, chills, states vomited multiple times today, last tried phenergan suppository last pm. Takes acyclovir and zantac as well.       OB History    Grav Para Term Preterm Abortions TAB SAB Ect Mult Living   4 2 2  0 1 0 1 0 0 2      Past Medical History  Diagnosis Date  . Sickle cell trait   . Abnormal Pap smear   . History of chlamydia infection   . Pregnancy induced hypertension     hx  . Herpes simplex type II infection     Past Surgical History  Procedure Date  . Knee surgery     Family History  Problem Relation Age of Onset  . Diabetes Maternal Grandmother   . Hypertension Mother   . Hypertension Father   . Heart disease Maternal Grandfather     History  Substance Use Topics  . Smoking status: Never Smoker   . Smokeless tobacco: Never Used  . Alcohol Use: No    Allergies: No Known Allergies  Prescriptions prior to admission  Medication Sig Dispense Refill  . acetaminophen (TYLENOL) 325 MG tablet Take 650 mg by mouth every 6 (six) hours as needed. For pain in stomach      . acyclovir (ZOVIRAX) 200 MG capsule Take 1 capsule (200 mg total) by mouth 5 (five) times daily.  25 capsule  0  . Doxylamine-Pyridoxine (DICLEGIS) 10-10 MG TBEC Take by mouth.      . methylPREDNISolone (MEDROL) 16 MG tablet Pt given handout on how to take medication.  12 tablet  0  . methylPREDNISolone (MEDROL) 4 MG tablet Pt given  handout on how to take medication.  7 tablet  0  . methylPREDNISolone (MEDROL) 8 MG tablet Pt given handout on how to take medication taper.  13 tablet  0  . ondansetron (ZOFRAN ODT) 4 MG disintegrating tablet Take 1 tablet (4 mg total) by mouth every 6 (six) hours as needed for nausea.  30 tablet  0  . promethazine (PHENERGAN) 25 MG suppository Place 1 suppository (25 mg total) rectally every 6 (six) hours as needed.  12 each  2  . ranitidine (ZANTAC) 150 MG tablet Take 1 tablet (150 mg total) by mouth 2 (two) times daily.  30 tablet  2    Review of Systems  Constitutional: Positive for weight loss and malaise/fatigue. Negative for fever and chills.  Respiratory: Negative for cough.   Gastrointestinal: Positive for nausea, vomiting and diarrhea. Negative for abdominal pain and constipation.  Genitourinary: Negative for dysuria.  Neurological: Negative for dizziness and headaches.   Physical Exam   Blood pressure 131/75, pulse 95, temperature 98.7 F (37.1 C), temperature source Oral, resp. rate 16, height 5\' 4"  (1.626 m), weight 209 lb (94.802 kg), last menstrual period 05/22/2012.  Physical Exam  Constitutional: She is oriented to person, place, and  time. She appears well-developed and well-nourished. No distress.  HENT:  Head: Normocephalic.  Cardiovascular: Normal rate.   Respiratory: Effort normal.  GI: Soft. She exhibits no distension and no mass. There is tenderness (diffuse, mild). There is no rebound and no guarding.  Musculoskeletal: Normal range of motion.  Neurological: She is alert and oriented to person, place, and time.  Skin: Skin is warm and dry.  Psychiatric: She has a normal mood and affect.    MAU Course  Procedures  MDM Discussed with Dr Ellyn Hack >> will give one bag IVF with KCL due to K+ of 3.2.  Then will give one bag with MVI.  Will reevaluated after fluids.  >> asking for something to drink, has kept ice and sips down.   Assessment and Plan  A:  Twin  IUP at [redacted]w[redacted]d        Hyperemesis       Probable gastroenteritis  P:  IV with KCL x one liter      IV with MVI x one liter      Will discharge home after fluids if stable [facility] 08/08/2012, 5:45 PM   Taking PO fluids without difficulty. Has medication at home for n/v but has not been taking. Will d/c home to follow up with Dr. Ellyn Hack. Return here as needed.

## 2012-08-08 NOTE — MAU Note (Signed)
Pt states had been on many different medications for n/v/d, chills, states vomited multiple times today, last tried phenergan suppository last pm. Takes acyclovir and zantac as well.

## 2012-09-19 ENCOUNTER — Encounter: Payer: Self-pay | Admitting: Family Medicine

## 2012-09-19 ENCOUNTER — Ambulatory Visit (INDEPENDENT_AMBULATORY_CARE_PROVIDER_SITE_OTHER): Payer: Medicaid Other | Admitting: Family Medicine

## 2012-09-19 VITALS — BP 145/96 | HR 79 | Temp 97.8°F | Wt 224.8 lb

## 2012-09-19 DIAGNOSIS — Z309 Encounter for contraceptive management, unspecified: Secondary | ICD-10-CM

## 2012-09-19 NOTE — Progress Notes (Signed)
  Subjective:    Patient ID: Natasha Smith, female    DOB: 1990/09/07, 22 y.o.   MRN: [id number]  HPI  Patient presents to clinic for follow up elective abortion at [redacted] week gestation with twins. She had vacuum assisted abortion 4 weeks ago in [place], [place]. Patient says it was very painful at first, but pelvic pain has resolved. She complains of persistent vaginal bleeding and blood clots - heavier than a period. She has been changing pads about every 3 hours. She has had NOT had intercourse for since abortion and is to refrain for 6 weeks.  Denies fevers, nausea/vomiting, pelvic pain. Interested in IUD/Mirena for birth control.  Review of Systems Per HPI    Objective:   Physical Exam  Constitutional: She appears well-nourished. No distress.  Pulmonary/Chest: Effort normal.  Abdominal: Soft. She exhibits no distension. There is no tenderness.   Time spent counseling face to face: 15 minutes.    Assessment & Plan:

## 2012-09-19 NOTE — Patient Instructions (Addendum)
Call the [facility] clinic and discuss heavy bleeding with nurse and ask about follow up appointment. We will order pelvic ultrasound to check for retained products. If you develop fevers, chills, nausea/vomiting, please return to clinic. Avoid sexual intercourse until Mirena in place.

## 2012-09-19 NOTE — Assessment & Plan Note (Signed)
Will order pelvic US to rule out retained POC.  Advised patient to schedule follow up appointment at abortion clinic.  If blood does not gradually resolve in 2 weeks, she is to return to clinic.

## 2012-09-24 ENCOUNTER — Ambulatory Visit (HOSPITAL_COMMUNITY): Admission: RE | Admit: 2012-09-24 | Payer: Medicaid Other | Source: Ambulatory Visit

## 2012-10-05 ENCOUNTER — Ambulatory Visit (HOSPITAL_COMMUNITY): Payer: Medicaid Other | Attending: Family Medicine

## 2012-10-10 ENCOUNTER — Encounter: Payer: Self-pay | Admitting: Family Medicine

## 2012-10-10 ENCOUNTER — Ambulatory Visit (INDEPENDENT_AMBULATORY_CARE_PROVIDER_SITE_OTHER): Payer: Medicaid Other | Admitting: Family Medicine

## 2012-10-10 VITALS — BP 146/88 | HR 94 | Temp 97.9°F | Ht 64.0 in | Wt 224.0 lb

## 2012-10-10 DIAGNOSIS — Z3009 Encounter for other general counseling and advice on contraception: Secondary | ICD-10-CM

## 2012-10-10 DIAGNOSIS — Z30430 Encounter for insertion of intrauterine contraceptive device: Secondary | ICD-10-CM

## 2012-10-10 LAB — POCT URINE PREGNANCY: Preg Test, Ur: NEGATIVE

## 2012-10-10 MED ORDER — LEVONORGESTREL 20 MCG/24HR IU IUD
INTRAUTERINE_SYSTEM | Freq: Once | INTRAUTERINE | Status: AC
  Administered 2012-10-10: 17:00:00 via INTRAUTERINE

## 2012-10-10 NOTE — Progress Notes (Signed)
  Subjective:    Patient ID: Natasha Smith, female    DOB: 1990-09-04, 22 y.o.   MRN: [id number]  HPI  GYNECOLOGY CLINIC PROCEDURE NOTE  Natasha Smith is a 22 y.o. [phone] here for Mirena IUD insertion. No GYN concerns.    IUD Insertion Procedure Note Patient identified, informed consent performed.  Discussed risks of irregular bleeding, cramping, infection, malpositioning or misplacement of the IUD outside the uterus which may require further procedures. Time out was performed.  Urine pregnancy test negative.  Speculum placed in the vagina.  Cervix visualized.  Cleaned with Betadine x 2.  Grasped anteriorly with a single tooth tenaculum.  Uterus sounded to 7 cm.  Mirena IUD placed per manufacturer's recommendations.  Strings trimmed to 3 cm. Tenaculum was removed, good hemostasis noted.  Patient tolerated procedure well.   Patient was given post-procedure instructions.  Patient was also asked to check IUD strings periodically and follow up in 4 weeks for IUD check.  Review of Systems Per HPI    Objective:   Physical Exam  Constitutional: She appears well-nourished. No distress.  Genitourinary: Uterus normal. There is no rash, tenderness or lesion on the right labia. There is no rash, tenderness or lesion on the left labia. Cervix exhibits discharge. Cervix exhibits no motion tenderness and no friability. Vaginal discharge found.      Assessment & Plan:

## 2012-10-10 NOTE — Patient Instructions (Addendum)
You may experience abnormal spotting and bleeding in the next 5-7 days.  Wear a pad or liner for one week.   You may experience cramps - take Motrin 2-3 tablets every 8 hours as needed for pain. Schedule follow up appointment in ONE week to recheck strings. Wear a condom for ONE week until you return to clinic to confirm string placement.  Intrauterine Device Insertion Care After Refer to this sheet in the next few weeks. These instructions provide you with information on caring for yourself after your procedure. Your caregiver may also give you more specific instructions. Your treatment has been planned according to current medical practices, but problems sometimes occur. Call your caregiver if you have any problems or questions after your procedure. HOME CARE INSTRUCTIONS   Only take over-the-counter or prescription medicines for pain, discomfort, or fever as directed by your caregiver. Do not use aspirin. This may increase bleeding.  Check your IUD to make sure it is in place before you resume sexual activity. You should be able to feel the strings. If you cannot feel the strings, something may be wrong. The IUD may have fallen out of the uterus, or the uterus may have been punctured (perforated) during placement. Also, if the strings are getting longer, it may mean that the IUD is being forced out of the uterus. You no longer have full protection from pregnancy if any of these problems occur.  You may resume sexual intercourse if you are not having problems with the IUD. The IUD is considered immediately effective.  You may resume normal activities.  Keep all follow-up appointments to be sure your IUD has remained in place. After the first exam, yearly exams are advised, unless you cannot feel the strings of your IUD.  Continue to check that the IUD is still in place by feeling for the strings after every menstrual period. SEEK MEDICAL CARE IF:   You have bleeding that is heavier or lasts  longer than a normal menstrual cycle.  You have a fever.  You have increasing cramps or abdominal pain not relieved with medicine.  You have abdominal pain that does not seem to be related to the same area of earlier cramping and pain.  You are lightheaded, unusually weak, or faint.  You have abnormal vaginal discharge or smells.  You have pain during sexual intercourse.  You cannot feel the IUD strings, or the IUD string has gotten longer.  You feel the IUD at the opening of the cervix in the vagina.  You think you are pregnant, or you miss your menstrual period.  The IUD string is hurting your sex partner. Document Released: 02/23/2011 Document Revised: 09/19/2011 Document Reviewed: 02/23/2011 [facility] Patient Information 2013 [place], [place].

## 2012-10-10 NOTE — Addendum Note (Signed)
Addended by: Jone Baseman D on: 10/10/2012 05:10 PM   Modules accepted: Orders

## 2012-10-10 NOTE — Assessment & Plan Note (Signed)
Successful insertion of IUD.  Recheck strings in 1-2 weeks.  Condoms until then.  Discussed cramping and bleeding.  See AVS.

## 2012-10-24 ENCOUNTER — Ambulatory Visit: Payer: Medicaid Other | Admitting: Family Medicine

## 2012-11-12 ENCOUNTER — Ambulatory Visit (INDEPENDENT_AMBULATORY_CARE_PROVIDER_SITE_OTHER): Payer: Medicaid Other | Admitting: Family Medicine

## 2012-11-12 ENCOUNTER — Encounter: Payer: Self-pay | Admitting: Family Medicine

## 2012-11-12 VITALS — BP 126/77 | HR 80 | Temp 99.1°F | Ht 64.0 in | Wt 224.0 lb

## 2012-11-12 DIAGNOSIS — B009 Herpesviral infection, unspecified: Secondary | ICD-10-CM

## 2012-11-12 DIAGNOSIS — L293 Anogenital pruritus, unspecified: Secondary | ICD-10-CM

## 2012-11-12 DIAGNOSIS — N898 Other specified noninflammatory disorders of vagina: Secondary | ICD-10-CM

## 2012-11-12 DIAGNOSIS — N760 Acute vaginitis: Secondary | ICD-10-CM

## 2012-11-12 DIAGNOSIS — A499 Bacterial infection, unspecified: Secondary | ICD-10-CM

## 2012-11-12 LAB — POCT WET PREP (WET MOUNT)

## 2012-11-12 MED ORDER — VALACYCLOVIR HCL 1 G PO TABS: 1000.0000 mg | ORAL_TABLET | Freq: Two times a day (BID) | ORAL | Status: DC

## 2012-11-12 NOTE — Progress Notes (Signed)
Subjective:    Natasha Smith is a 22 y.o. female who presents to [facility] today with complaints of vaginal itching:  1.  Vaginal itching:  Present for the past week or so. She has been diagnosed in the past with genital herpes. She has a flare up to 3 times a year. She will come in and received antiviral medication which results were flare.  Currently she describes "tingling" around genital area. No new sexual contacts. She has mild vaginal discharge and itching as well. No vaginal bleeding.  No N/V.    The following portions of the patient's history were reviewed and updated as appropriate: allergies, current medications, past medical history, family and social history, and problem list. Patient is a nonsmoker.    PMH reviewed.  Past Medical History  Diagnosis Date  . Sickle cell trait   . Abnormal Pap smear   . History of chlamydia infection   . Pregnancy induced hypertension     hx  . Herpes simplex type II infection    Past Surgical History  Procedure Laterality Date  . Knee surgery      Medications reviewed. Current Outpatient Prescriptions  Medication Sig Dispense Refill  . acetaminophen (TYLENOL) 325 MG tablet Take 650 mg by mouth every 6 (six) hours as needed. For pain in stomach      . Prenatal Vit-Fe Fumarate-FA (PRENATAL MULTIVITAMIN) TABS Take 1 tablet by mouth daily.      . promethazine (PHENERGAN) 25 MG suppository Place 1 suppository (25 mg total) rectally every 6 (six) hours as needed.  12 each  2  . ranitidine (ZANTAC) 150 MG tablet Take 1 tablet (150 mg total) by mouth 2 (two) times daily.  30 tablet  2   No current facility-administered medications for this visit.    ROS as above otherwise neg.     Objective:   Physical Exam BP 126/77  Pulse 80  Temp(Src) 99.1 F (37.3 C) (Oral)  Ht 5\' 4"  (1.626 m)  Wt 224 lb (101.606 kg)  BMI 38.43 kg/m2  LMP 05/22/2012 Gen:  Alert, cooperative patient who appears stated age in no acute distress.  Vital signs  reviewed. HEENT: EOMI,  MMM Abd:  Soft/nondistended/nontender.  Good bowel sounds throughout all four quadrants.  No masses noted.  GYN:  External genitalia does demonstrate one half shallow ulcer right labia. This is tender to palpation..  Vaginal mucosa pink, moist, normal rugae.  Nonfriable cervix without lesions, minimal discharge without bleeding noted on speculum exam.  Bimanual exam revealed normal, nongravid uterus.  No cervical motion tenderness. No adnexal masses bilaterally.     No results found for this or any previous visit (from the past 72 hour(s)).

## 2012-11-12 NOTE — Patient Instructions (Signed)
We will call you if the swab shows something.    I have sent in your medicine for you.    It was good to see you today

## 2012-11-13 ENCOUNTER — Telehealth: Payer: Self-pay | Admitting: Family Medicine

## 2012-11-13 MED ORDER — METRONIDAZOLE 500 MG PO TABS: 500.0000 mg | ORAL_TABLET | Freq: Two times a day (BID) | ORAL | Status: DC

## 2012-11-13 NOTE — Telephone Encounter (Signed)
Attempted to call patient regarding BV and treatment but phone number provided is no longer patient's number.  Do not have another number to call.  Please note if patient calls with questions about the Flagyl I sent in.

## 2012-11-13 NOTE — Assessment & Plan Note (Signed)
Valtrex for the next 5 days to treat. Followup if no improvement

## 2012-11-13 NOTE — Assessment & Plan Note (Signed)
Again noted on wet prep today. We'll treat with Flagyl.

## 2012-12-12 ENCOUNTER — Emergency Department (HOSPITAL_COMMUNITY)
Admission: EM | Admit: 2012-12-12 | Discharge: 2012-12-12 | Disposition: A | Discharge status: Home / Self Care | Payer: Medicaid Other | Attending: Emergency Medicine | Admitting: Emergency Medicine

## 2012-12-12 ENCOUNTER — Encounter (HOSPITAL_COMMUNITY): Payer: Self-pay | Admitting: *Deleted

## 2012-12-12 DIAGNOSIS — Z3202 Encounter for pregnancy test, result negative: Secondary | ICD-10-CM | POA: Insufficient documentation

## 2012-12-12 DIAGNOSIS — G43909 Migraine, unspecified, not intractable, without status migrainosus: Secondary | ICD-10-CM | POA: Insufficient documentation

## 2012-12-12 DIAGNOSIS — Z862 Personal history of diseases of the blood and blood-forming organs and certain disorders involving the immune mechanism: Secondary | ICD-10-CM | POA: Insufficient documentation

## 2012-12-12 DIAGNOSIS — Z8619 Personal history of other infectious and parasitic diseases: Secondary | ICD-10-CM | POA: Insufficient documentation

## 2012-12-12 LAB — POCT PREGNANCY, URINE: Preg Test, Ur: NEGATIVE

## 2012-12-12 MED ORDER — DIPHENHYDRAMINE HCL 50 MG/ML IJ SOLN
25.0000 mg | Freq: Once | INTRAMUSCULAR | Status: AC
  Administered 2012-12-12: 25 mg via INTRAMUSCULAR
  Filled 2012-12-12: qty 1

## 2012-12-12 MED ORDER — METOCLOPRAMIDE HCL 5 MG/ML IJ SOLN
10.0000 mg | Freq: Once | INTRAMUSCULAR | Status: AC
  Administered 2012-12-12: 10 mg via INTRAMUSCULAR
  Filled 2012-12-12: qty 2

## 2012-12-12 MED ORDER — PROMETHAZINE HCL 25 MG/ML IJ SOLN
12.5000 mg | Freq: Once | INTRAMUSCULAR | Status: AC
  Administered 2012-12-12: 12.5 mg via INTRAMUSCULAR
  Filled 2012-12-12: qty 1

## 2012-12-12 MED ORDER — ONDANSETRON 4 MG PO TBDP
8.0000 mg | ORAL_TABLET | Freq: Once | ORAL | Status: AC
  Administered 2012-12-12: 8 mg via ORAL
  Filled 2012-12-12: qty 2

## 2012-12-12 MED ORDER — KETOROLAC TROMETHAMINE 60 MG/2ML IM SOLN
60.0000 mg | Freq: Once | INTRAMUSCULAR | Status: AC
  Administered 2012-12-12: 60 mg via INTRAMUSCULAR
  Filled 2012-12-12: qty 2

## 2012-12-12 NOTE — ED Notes (Signed)
zofran given for nausea  

## 2012-12-12 NOTE — ED Provider Notes (Addendum)
History     CSN: [id number]  Arrival date & time 12/12/12  0010   First MD Initiated Contact with Patient 12/12/12 0216      Chief Complaint  Patient presents with  . Headache    (Consider location/radiation/quality/duration/timing/severity/associated sxs/prior treatment) Patient is a 22 y.o. female presenting with migraines. The history is provided by the patient.  Migraine This is a recurrent problem. The current episode started 12 to 24 hours ago. The problem occurs constantly. The problem has not changed since onset.Pertinent negatives include no chest pain, no abdominal pain and no shortness of breath. Nothing aggravates the symptoms. Nothing relieves the symptoms. She has tried nothing for the symptoms. The treatment provided no relief.  frontal HA.  No focal neuro symptoms.  No changes in vision nor speech nor cognition  Past Medical History  Diagnosis Date  . Sickle cell trait   . Abnormal Pap smear   . History of chlamydia infection   . Pregnancy induced hypertension     hx  . Herpes simplex type II infection     Past Surgical History  Procedure Laterality Date  . Knee surgery      Family History  Problem Relation Age of Onset  . Diabetes Maternal Grandmother   . Hypertension Mother   . Hypertension Father   . Heart disease Maternal Grandfather     History  Substance Use Topics  . Smoking status: Never Smoker   . Smokeless tobacco: Never Used  . Alcohol Use: No    OB History   Grav Para Term Preterm Abortions TAB SAB Ect Mult Living   4 2 2  0 1 0 1 0 0 2      Review of Systems  Respiratory: Negative for shortness of breath.   Cardiovascular: Negative for chest pain.  Gastrointestinal: Negative for abdominal pain.  All other systems reviewed and are negative.    Allergies  Review of patient's allergies indicates no known allergies.  Home Medications   Current Outpatient Rx  Name  Route  Sig  Dispense  Refill  . ibuprofen (ADVIL,MOTRIN) 200  MG tablet   Oral   Take 400 mg by mouth every 6 (six) hours as needed for pain, fever or headache.         . metroNIDAZOLE (FLAGYL) 500 MG tablet   Oral   Take 1 tablet (500 mg total) by mouth 2 (two) times daily. X 7 days   14 tablet   0     BP 148/92  Pulse 72  Temp(Src) 97.6 F (36.4 C) (Oral)  Resp 20  SpO2 100%  LMP 05/22/2012  Physical Exam  Constitutional: She is oriented to person, place, and time. She appears well-developed and well-nourished. No distress.  HENT:  Head: Normocephalic and atraumatic.  Mouth/Throat: Oropharynx is clear and moist.  No temporal tenderness comfortable in room w lights on  Eyes: Conjunctivae and EOM are normal. Pupils are equal, round, and reactive to light.  Neck: Normal range of motion. Neck supple.  Cardiovascular: Normal rate, regular rhythm and intact distal pulses.   Pulmonary/Chest: Effort normal and breath sounds normal.  Abdominal: Soft. Bowel sounds are normal. There is no tenderness. There is no rebound and no guarding.  Musculoskeletal: Normal range of motion.  Neurological: She is alert and oriented to person, place, and time. She has normal reflexes. She displays normal reflexes. No cranial nerve deficit. She exhibits normal muscle tone. Coordination normal.  Intact cognition  Skin: Skin is warm and  dry.    ED Course  Procedures (including critical care time)  Labs Reviewed - No data to display No results found.   No diagnosis found.    MDM  Headache is typical for the patient, not sudden onset equal in intensity to prior HA.  No indication for imaging or LP at this time.  Return for worsening symptoms       Keyanah Kozicki K Kham Zuckerman-Rasch, MD 12/12/12 0408  Zayne Draheim K Arif Amendola-Rasch, MD 12/12/12 1610

## 2012-12-12 NOTE — ED Notes (Signed)
Headache all day with nv.  History of the same

## 2013-01-14 ENCOUNTER — Ambulatory Visit (INDEPENDENT_AMBULATORY_CARE_PROVIDER_SITE_OTHER): Payer: Medicaid Other | Admitting: Family Medicine

## 2013-01-14 ENCOUNTER — Encounter: Payer: Self-pay | Admitting: Family Medicine

## 2013-01-14 VITALS — BP 150/94 | HR 81 | Ht 64.0 in | Wt 219.3 lb

## 2013-01-14 DIAGNOSIS — N912 Amenorrhea, unspecified: Secondary | ICD-10-CM

## 2013-01-14 DIAGNOSIS — B009 Herpesviral infection, unspecified: Secondary | ICD-10-CM

## 2013-01-14 DIAGNOSIS — A6000 Herpesviral infection of urogenital system, unspecified: Secondary | ICD-10-CM

## 2013-01-14 DIAGNOSIS — R109 Unspecified abdominal pain: Secondary | ICD-10-CM

## 2013-01-14 LAB — POCT URINALYSIS DIPSTICK
Glucose, UA: NEGATIVE
Spec Grav, UA: >=1.03

## 2013-01-14 LAB — POCT URINE PREGNANCY: Preg Test, Ur: NEGATIVE

## 2013-01-14 MED ORDER — VALACYCLOVIR HCL 1 G PO TABS: 1000.0000 mg | ORAL_TABLET | Freq: Two times a day (BID) | ORAL | Status: DC

## 2013-01-14 NOTE — Assessment & Plan Note (Addendum)
Likely at beginning of an outbreak. Given Rx for valtrex with 3 refills to be used for future outbreaks. Will return to talk to Dr. Gwenlyn Saran about daily therapy if continue outbreaks at high frequency.

## 2013-01-14 NOTE — Progress Notes (Signed)
  Subjective:    Patient ID: Natasha Smith, female    DOB: 03-19-1991, 22 y.o.   MRN: [id number]  HPI  22 year old F presenting for vaginal itching and mild burning. Symptoms are "not bad." She has a history of HSV2 diagnosed in 2013. Last outbreak was in May 2014, for which she took Valtrex and symptoms improve rapidly. She notes 2-3 outbreaks since the initial diagnosis. She is wondering if she should be on daily medication.   Review of Systems Positive for abdominal pain, otherwise negative    Objective:   Physical Exam BP 150/94  Pulse 81  Ht 5\' 4"  (1.626 m)  Wt 219 lb 4.8 oz (99.474 kg)  BMI 37.62 kg/m2  LMP 05/22/2012  Gen: obese AAF, well apearing GU: External: no lesions      Assessment & Plan:

## 2013-01-14 NOTE — Patient Instructions (Signed)
Dear Ms. Dominik,   Please start taking valtrex twice a day for the next five days. If this does not resolve the symptoms, then continue for a total of 10 days. I have sent 3 refills to the pharmacy to cover for recurrent outbreaks. If you continue to have outbreaks every 2-3 months, then please talk to your doctor about daily therapy.   Also, please return to see Dr. Gwenlyn Saran about your blood pressure.   Take Care,   Dr. Clinton Sawyer

## 2013-03-05 ENCOUNTER — Ambulatory Visit: Payer: Medicaid Other | Admitting: Family Medicine

## 2013-03-25 ENCOUNTER — Encounter: Payer: Self-pay | Admitting: Family Medicine

## 2013-03-25 ENCOUNTER — Ambulatory Visit (INDEPENDENT_AMBULATORY_CARE_PROVIDER_SITE_OTHER): Payer: Medicaid Other | Admitting: Family Medicine

## 2013-03-25 VITALS — BP 150/92 | HR 80 | Temp 98.0°F | Ht 64.0 in | Wt 225.0 lb

## 2013-03-25 DIAGNOSIS — B009 Herpesviral infection, unspecified: Secondary | ICD-10-CM

## 2013-03-25 MED ORDER — VALACYCLOVIR HCL 1 G PO TABS: 1000.0000 mg | ORAL_TABLET | Freq: Every day | ORAL | Status: DC

## 2013-03-25 NOTE — Patient Instructions (Addendum)
Follow up in 3 months to see how you are doing with the medicine.

## 2013-03-26 NOTE — Assessment & Plan Note (Signed)
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 Will start daily suppressive therapy at high dose 1000mg  daily.  Patient to return to clinic if she continues having outbreaks with current treatment.

## 2013-03-26 NOTE — Progress Notes (Signed)
Patient ID: Natasha Smith    DOB: 06-02-1991, 22 y.o.   MRN: [id number] --- Subjective:  Natasha Smith is a 22 y.o.female who presents for follow up on HSV2 medications.  - HSV2: patient was treated for outbreak on 01/14/13. She reports that this weekend, she had outbreak with lesions on left labia. Lesions resolved but she continues to have irritation in the area. She reports getting outbreak twice a month. She denies any vaginal discharge. She uses condoms. She inquires about daily suppressive therapy. She tolerates acyclovir well.   ROS: see HPI Past Medical History: reviewed and updated medications and allergies. Social History: Tobacco: none  Objective: Filed Vitals:   03/25/13 1129  BP: 150/92  Pulse: 80  Temp: 98 F (36.7 C)    Physical Examination:   General appearance - alert, well appearing, and in no distress Genital Urinary Exam: external: no lesions or ulcerations appreciated.

## 2013-06-19 ENCOUNTER — Ambulatory Visit (INDEPENDENT_AMBULATORY_CARE_PROVIDER_SITE_OTHER): Payer: Medicaid Other | Admitting: Family Medicine

## 2013-07-11 NOTE — L&D Delivery Note (Signed)
Delivery Note At 11:23 PM a viable female "Natasha Smith" was delivered via Vaginal, Spontaneous Delivery (Presentation: OA, restituting to Left Occiput Anterior).  APGARS: 8/9, weight pending at the time of this note.   Pitocin started after delivery of the anterior shoulder. No other uterotonic agents required.   Controlled cord traction and uterine massage instituted to expedite placenta delivery. Placenta status: Intact, Spontaneous.  Cord: 3 vessels with the following complications: Loose nuchal x 1, manually reduced. Cord pH: NA.   Anesthesia: Epidural  Episiotomy: None Lacerations: None Suture Repair: NA Est. Blood Loss (mL): 300.   Mom to postpartum.  Baby to Couplet care / Skin to Skin.  Mom plans to both breast and bottle feed.  Desires Nexplanon for contraception, undecided re: BC in the interim.  [name], Journiee Feldkamp 06/12/2014, 11:53 PM

## 2013-07-17 ENCOUNTER — Encounter: Payer: Self-pay | Admitting: Family Medicine

## 2013-07-17 ENCOUNTER — Ambulatory Visit (INDEPENDENT_AMBULATORY_CARE_PROVIDER_SITE_OTHER): Payer: Medicaid Other | Admitting: Family Medicine

## 2013-07-17 ENCOUNTER — Other Ambulatory Visit (HOSPITAL_COMMUNITY)
Admission: RE | Admit: 2013-07-17 | Discharge: 2013-07-17 | Disposition: A | Discharge status: Home / Self Care | Payer: Medicaid Other | Source: Ambulatory Visit | Attending: Family Medicine | Admitting: Family Medicine

## 2013-07-17 VITALS — BP 137/87 | HR 83 | Temp 97.6°F | Ht 64.0 in | Wt 211.1 lb

## 2013-07-17 DIAGNOSIS — R300 Dysuria: Secondary | ICD-10-CM

## 2013-07-17 DIAGNOSIS — R109 Unspecified abdominal pain: Secondary | ICD-10-CM

## 2013-07-17 DIAGNOSIS — R1030 Lower abdominal pain, unspecified: Secondary | ICD-10-CM

## 2013-07-17 DIAGNOSIS — N760 Acute vaginitis: Secondary | ICD-10-CM

## 2013-07-17 DIAGNOSIS — Z113 Encounter for screening for infections with a predominantly sexual mode of transmission: Secondary | ICD-10-CM | POA: Insufficient documentation

## 2013-07-17 DIAGNOSIS — N912 Amenorrhea, unspecified: Secondary | ICD-10-CM

## 2013-07-17 LAB — POCT URINALYSIS DIPSTICK
Bilirubin, UA: NEGATIVE
Blood, UA: NEGATIVE
Glucose, UA: NEGATIVE
Ketones, UA: 15
LEUKOCYTES UA: NEGATIVE
NITRITE UA: NEGATIVE
PH UA: 7.5
PROTEIN UA: NEGATIVE
Spec Grav, UA: 1.02
Urobilinogen, UA: 1

## 2013-07-17 LAB — POCT WET PREP (WET MOUNT)
Clue Cells Wet Prep Whiff POC: POSITIVE
WBC, Wet Prep HPF POC: >20

## 2013-07-17 LAB — POCT URINE PREGNANCY: PREG TEST UR: NEGATIVE

## 2013-07-17 MED ORDER — AZITHROMYCIN 250 MG PO TABS
250.0000 mg | ORAL_TABLET | Freq: Once | ORAL | Status: AC
Start: 2013-07-17 — End: 2013-07-17
  Administered 2013-07-17: 250 mg via ORAL

## 2013-07-17 MED ORDER — CEFTRIAXONE SODIUM 1 G IJ SOLR
250.0000 mg | Freq: Once | INTRAMUSCULAR | Status: AC
  Administered 2013-07-17: 250 mg via INTRAMUSCULAR

## 2013-07-17 NOTE — Progress Notes (Signed)
Tana Conch, MD Phone: [phone]  Subjective:  Chief complaint-noted  Pelvic Pain Lower abdominal stabbing type pain x 2 weeks. Pain about the same since 2 weeks ago and up to 8/10.  Has some nausea and spitting up with these episodes. Comes every 4-5 hours, lasts 15 minutes. Drinks lots of water, decreased appetite for food. Pain goes away on its own. Taking ibuprofen but doesn't really have time to work before pain goes away, also tried tylenol. Had pain like this last summer when was pregnant with twins. Has IUD in place. Not sexually active for about 2 months.  LMP 07/12/13 and finished on Monday. Pain slightly worse with period but not much. Endorses polyuria. Occasional loose stool. Kids have been sick with colds but no nausea or decreased appetite. No pain right now.   ROS-occasional chills. No fever. No vaginal discharge. No dysuria.   Past Medical History-IUD for birth control since early 2014, abortion, history of BV, genital herpes, elevated blood pressure not diagnosed as hypertension, eczema in summer, history of chlamydia.   Medications- reviewed and updated Current Outpatient Prescriptions  Medication Sig Dispense Refill  . ibuprofen (ADVIL,MOTRIN) 200 MG tablet Take 400 mg by mouth every 6 (six) hours as needed for pain, fever or headache.      . valACYclovir (VALTREX) 1000 MG tablet Take 1 tablet (1,000 mg total) by mouth daily.  30 tablet  3   No current facility-administered medications for this visit.    Objective: BP 137/87  Pulse 83  Temp(Src) 97.6 F (36.4 C) (Oral)  Ht 5\' 4"  (1.626 m)  Wt 211 lb 1.6 oz (95.754 kg)  BMI 36.22 kg/m2 Gen: NAD, resting comfortably in chair CV: RRR no murmurs rubs or gallops Lungs: CTAB no crackles, wheeze, rhonchi Abdomen: diffusely mildly tender. Nondistended. No rebounding or guarding.  Ext: no edema Pelvic: cervix normal in appearance, external genitalia normal, no adnexal masses or tenderness, no cervical motion  tenderness, uterus normal size, shape, and consistency and vagina with clear to whitish discharge moderate amount   Results for orders placed in visit on 07/17/13 (from the past 24 hour(s))  POCT URINE PREGNANCY     Status: None   Collection Time    07/17/13  9:45 AM      Result Value Range   Preg Test, Ur Negative    POCT URINALYSIS DIPSTICK     Status: None   Collection Time    07/17/13 10:08 AM      Result Value Range   Color, UA YELLOW     Clarity, UA CLEAR     Glucose, UA NEG     Bilirubin, UA NEG     Ketones, UA 15     Spec Grav, UA 1.020     Blood, UA NEG     pH, UA 7.5     Protein, UA NEG     Urobilinogen, UA 1.0     Nitrite, UA NEG     Leukocytes, UA Negative     Narrative:    Biochemical negative:  Microscopic not indicated     Assessment/Plan:  Pelvic pain upreg and urine unremarkable. History of STDs-both chlamydia and HSV. Wet prep/gc/chlamydia pending.   Uncertain etiology.  Patient with no CMT but given high risk, treated with CTX 250mg  and azithromycin 1g. F/u labs as above. Will treat and have patient follow up in 7-10 days if no improvement or sooner if symptoms worsen or febrile. Considered pelvic u/s but will hold off for now.  Orders Placed This Encounter  Procedures  . POCT urine pregnancy  . POCT urinalysis dipstick  . POCT Wet Prep [facility] [place])    No orders of the defined types were placed in this encounter.

## 2013-07-17 NOTE — Assessment & Plan Note (Addendum)
upreg and urine unremarkable. History of STDs-both chlamydia and HSV. Wet prep/gc/chlamydia pending.   Uncertain etiology.  Patient with no CMT but given high risk, treated with CTX 250mg  and azithromycin 1g. F/u labs as above. Will treat and have patient follow up in 7-10 days if no improvement or sooner if symptoms worsen or febrile. Considered pelvic u/s but will hold off for now.

## 2013-07-17 NOTE — Patient Instructions (Addendum)
With your history of chlamydia and the discharge as well as abdominal pain, I am concerned about a potential infection. I think we should treat you for these while awaiting your tests which can take a few days-we did this in the office today. We also have a wet prep looking for yeast, trichomonas, or bacterial vaginosis. We will call with these results. Do not have sex for 2 weeks and have any partners tested and do not have sex until they are treated.   Please come see us (me or Dr. Gwenlyn SaranLosq) in a week to check in or sooner if you develop fever, worsening pain. We should also test you for syphilis and HIV at next visit (blood draws). Did not do these due to your time constraints.  Dr. Durene CalHunter   You need the following: Health Maintenance Due  Topic Date Due  . Pap Smear  10/08/2008  . Influenza Vaccine  02/08/2013

## 2013-07-18 ENCOUNTER — Telehealth: Payer: Self-pay | Admitting: Family Medicine

## 2013-07-18 MED ORDER — METRONIDAZOLE 500 MG PO TABS: 500.0000 mg | ORAL_TABLET | Freq: Two times a day (BID) | ORAL | Status: DC

## 2013-07-18 NOTE — Telephone Encounter (Signed)
Admin-->forward to blue team Mobile not in service. Home phone did not say patient name. LVM for patient to return call.   Blue team--> Please inform patient had bacterial vaginosis and trichomonas. Still pending gonorrhea chlamydia (for which we treated patient). I have sent in Rx for flagyl x 7 days which will treat both infections. Please inform that trichomonas is sexually transmitted and patient should have all partners treated and not have sex until a week after all parties finish treatment.

## 2013-07-19 ENCOUNTER — Encounter: Payer: Self-pay | Admitting: *Deleted

## 2013-07-19 ENCOUNTER — Telehealth: Payer: Self-pay | Admitting: *Deleted

## 2013-07-19 NOTE — Telephone Encounter (Signed)
LM for pt to call back.  Natasha Smith,CMA  

## 2013-07-19 NOTE — Telephone Encounter (Signed)
Letter mailed to pt. Natasha Smith,CMA  

## 2013-07-19 NOTE — Telephone Encounter (Signed)
Message copied by Henri MedalHARTSELL, JAZMIN M on Fri Jul 19, 2013  8:37 AM ------      Message from: Shelva MajesticHUNTER, STEPHEN O      Created: Thu Jul 18, 2013  9:42 PM       Blue team-Gc/chlamydia fortunately were negative. Please inform patient. ------

## 2013-07-19 NOTE — Telephone Encounter (Signed)
Tried to call pt and unable to leave a message.  Please inform her of this if she calls back. Natasha Smith,CMA

## 2013-08-06 ENCOUNTER — Ambulatory Visit (INDEPENDENT_AMBULATORY_CARE_PROVIDER_SITE_OTHER): Payer: Medicaid Other | Admitting: Family Medicine

## 2013-08-06 ENCOUNTER — Encounter: Payer: Self-pay | Admitting: Family Medicine

## 2013-08-06 VITALS — BP 143/98 | HR 71 | Temp 97.7°F | Ht 64.0 in | Wt 211.0 lb

## 2013-08-06 DIAGNOSIS — IMO0001 Reserved for inherently not codable concepts without codable children: Secondary | ICD-10-CM | POA: Insufficient documentation

## 2013-08-06 DIAGNOSIS — Z309 Encounter for contraceptive management, unspecified: Secondary | ICD-10-CM

## 2013-08-06 DIAGNOSIS — Z30430 Encounter for insertion of intrauterine contraceptive device: Secondary | ICD-10-CM

## 2013-08-06 DIAGNOSIS — R030 Elevated blood-pressure reading, without diagnosis of hypertension: Secondary | ICD-10-CM

## 2013-08-06 NOTE — Assessment & Plan Note (Signed)
Patient with difficulty tolerating daily bleeding on IUD. Although I am often hesitant to remove device sooner than 1 year since insertion, I think it is reasonable in this case since it is affecting her daily quality of life.  Removed IUD successfully. Patient is interested in nexplanon which she can make appointment for with me. Gave her information about this and explained that it could also cause irregular bleeding. 

## 2013-08-06 NOTE — Assessment & Plan Note (Signed)
Elevated BP today and has had some marginal BP's in the past.  - check BMP and TSH - will likely start her on hctz or amlodipine if BMP ok.

## 2013-08-06 NOTE — Progress Notes (Signed)
Patient ID: Natasha Smith    DOB: 12-05-1990, 22 y.o.   MRN: [id number] --- Subjective:  Natasha Smith is a 23 y.o.female who presents for removal of IUD. She had a Mirena IUD inserted in April 2014. Ever since she had and she has had dealing spotting. She goes one to 2 days without bleeding and then recurrence of bleeding. She says that she has some heavier spotting with blood clots on a regular basis. For something that she is no longer tolerating and which is making her depressed. Prior to the IUD she had been on Cipro. She has been pregnant 3 times has to live children and miscarried at 3 months when pregnancy. On the Depakote she had regular periods. She not have any trouble with spotting or irregular bleeding. She denies any current abdominal pain, nausea, vomiting. She was diagnosed with BV and trich earlier this month and was prescribed Flagyl which she has taken.  She is interested in the nexplanon. She is sexually active.   ROS: see HPI Past Medical History: reviewed and updated medications and allergies. Social History: Tobacco: none  Objective: Filed Vitals:   08/06/13 1352  BP: 156/94  Pulse: 78  Temp: 97.7 F (36.5 C)    Physical Examination:   General appearance - alert, well appearing, and in no distress Chest - clear to auscultation, no wheezes, rales or rhonchi, symmetric air entry Heart - normal rate, regular rhythm, normal S1, S2, no murmurs, rubs, clicks or gallops Abdomen - soft, nontender, nondistendedExtremities -  Pelvic exam: normal external genitalia, vulva, vagina, cervix   IUD removal:  Cervix was visualized. Strings were visualized at cervical os. Strings were grasped with ring forceps and IUD was successfully removed. Patient tolerated procedure without complications.

## 2013-08-06 NOTE — Patient Instructions (Addendum)
Please make an appointment for a nexplanon insertion.  In the meantime, you are at risk of getting pregnant, so either use condoms or do not have sexual intercourse.      Etonogestrel implant What is this medicine? ETONOGESTREL (et oh noe JES trel) is a contraceptive (birth control) device. It is used to prevent pregnancy. It can be used for up to 3 years. This medicine may be used for other purposes; ask your health care provider or pharmacist if you have questions. COMMON BRAND NAME(S): Implanon, Nexplanon  What should I tell my health care provider before I take this medicine? They need to know if you have any of these conditions: -abnormal vaginal bleeding -blood vessel disease or blood clots -cancer of the breast, cervix, or liver -depression -diabetes -gallbladder disease -headaches -heart disease or recent heart attack -high blood pressure -high cholesterol -kidney disease -liver disease -renal disease -seizures -tobacco smoker -an unusual or allergic reaction to etonogestrel, other hormones, anesthetics or antiseptics, medicines, foods, dyes, or preservatives -pregnant or trying to get pregnant -breast-feeding How should I use this medicine? This device is inserted just under the skin on the inner side of your upper arm by a health care professional. Talk to your pediatrician regarding the use of this medicine in children. Special care may be needed. Overdosage: If you think you've taken too much of this medicine contact a poison control center or emergency room at once. Overdosage: If you think you have taken too much of this medicine contact a poison control center or emergency room at once. NOTE: This medicine is only for you. Do not share this medicine with others. What if I miss a dose? This does not apply. What may interact with this medicine? Do not take this medicine with any of the following medications: -amprenavir -bosentan -fosamprenavir This medicine  may also interact with the following medications: -barbiturate medicines for inducing sleep or treating seizures -certain medicines for fungal infections like ketoconazole and itraconazole -griseofulvin -medicines to treat seizures like carbamazepine, felbamate, oxcarbazepine, phenytoin, topiramate -modafinil -phenylbutazone -rifampin -some medicines to treat HIV infection like atazanavir, indinavir, lopinavir, nelfinavir, tipranavir, ritonavir -St. John's wort This list may not describe all possible interactions. Give your health care provider a list of all the medicines, herbs, non-prescription drugs, or dietary supplements you use. Also tell them if you smoke, drink alcohol, or use illegal drugs. Some items may interact with your medicine. What should I watch for while using this medicine? This product does not protect you against HIV infection (AIDS) or other sexually transmitted diseases. You should be able to feel the implant by pressing your fingertips over the skin where it was inserted. Tell your doctor if you cannot feel the implant. What side effects may I notice from receiving this medicine? Side effects that you should report to your doctor or health care professional as soon as possible: -allergic reactions like skin rash, itching or hives, swelling of the face, lips, or tongue -breast lumps -changes in vision -confusion, trouble speaking or understanding -dark urine -depressed mood -general ill feeling or flu-like symptoms -light-colored stools -loss of appetite, nausea -right upper belly pain -severe headaches -severe pain, swelling, or tenderness in the abdomen -shortness of breath, chest pain, swelling in a leg -signs of pregnancy -sudden numbness or weakness of the face, arm or leg -trouble walking, dizziness, loss of balance or coordination -unusual vaginal bleeding, discharge -unusually weak or tired -yellowing of the eyes or skin Side effects that usually do  not  require medical attention (Report these to your doctor or health care professional if they continue or are bothersome.): -acne -breast pain -changes in weight -cough -fever or chills -headache -irregular menstrual bleeding -itching, burning, and vaginal discharge -pain or difficulty passing urine -sore throat This list may not describe all possible side effects. Call your doctor for medical advice about side effects. You may report side effects to FDA at 1-800-FDA-1088. Where should I keep my medicine? This drug is given in a hospital or clinic and will not be stored at home. NOTE: This sheet is a summary. It may not cover all possible information. If you have questions about this medicine, talk to your doctor, pharmacist, or health care provider.  2014, [employer]. (2012-01-02 15:37:45)

## 2013-08-06 NOTE — Assessment & Plan Note (Signed)
Patient with difficulty tolerating daily bleeding on IUD. Although I am often hesitant to remove device sooner than 1 year since insertion, I think it is reasonable in this case since it is affecting her daily quality of life.  Removed IUD successfully. Patient is interested in nexplanon which she can make appointment for with me. Gave her information about this and explained that it could also cause irregular bleeding.

## 2013-08-07 ENCOUNTER — Encounter: Payer: Self-pay | Admitting: Family Medicine

## 2013-08-07 LAB — HEPATIC FUNCTION PANEL
ALT: 12 U/L (ref 0–35)
AST: 14 U/L (ref 0–37)
Albumin: 3.8 g/dL (ref 3.5–5.2)
Alkaline Phosphatase: 74 U/L (ref 39–117)
BILIRUBIN DIRECT: 0.2 mg/dL (ref 0.0–0.3)
BILIRUBIN INDIRECT: 0.7 mg/dL (ref 0.0–0.9)
Total Bilirubin: 0.9 mg/dL (ref 0.3–1.2)
Total Protein: 5.8 g/dL — ABNORMAL LOW (ref 6.0–8.3)

## 2013-08-07 LAB — BASIC METABOLIC PANEL
BUN: 11 mg/dL (ref 6–23)
CHLORIDE: 109 meq/L (ref 96–112)
CO2: 28 mEq/L (ref 19–32)
Calcium: 9.4 mg/dL (ref 8.4–10.5)
Creat: 0.73 mg/dL (ref 0.50–1.10)
Glucose, Bld: 72 mg/dL (ref 70–99)
POTASSIUM: 4 meq/L (ref 3.5–5.3)
Sodium: 141 mEq/L (ref 135–145)

## 2013-08-07 LAB — TSH: TSH: 0.432 u[IU]/mL (ref 0.350–4.500)

## 2013-08-18 ENCOUNTER — Inpatient Hospital Stay (HOSPITAL_COMMUNITY)
Admission: AD | Admit: 2013-08-18 | Discharge: 2013-08-18 | Disposition: A | Discharge status: Home / Self Care | Payer: Medicaid Other | Source: Ambulatory Visit | Attending: Obstetrics & Gynecology | Admitting: Obstetrics & Gynecology

## 2013-08-18 ENCOUNTER — Emergency Department (HOSPITAL_COMMUNITY)
Admission: EM | Admit: 2013-08-18 | Discharge: 2013-08-18 | Discharge status: Against Medical Advice | Payer: Medicaid Other

## 2013-08-18 ENCOUNTER — Inpatient Hospital Stay (HOSPITAL_COMMUNITY): Payer: Medicaid Other

## 2013-08-18 ENCOUNTER — Encounter (HOSPITAL_COMMUNITY): Payer: Self-pay | Admitting: *Deleted

## 2013-08-18 DIAGNOSIS — D571 Sickle-cell disease without crisis: Secondary | ICD-10-CM | POA: Insufficient documentation

## 2013-08-18 DIAGNOSIS — Z202 Contact with and (suspected) exposure to infections with a predominantly sexual mode of transmission: Secondary | ICD-10-CM

## 2013-08-18 DIAGNOSIS — N949 Unspecified condition associated with female genital organs and menstrual cycle: Secondary | ICD-10-CM | POA: Insufficient documentation

## 2013-08-18 DIAGNOSIS — A5901 Trichomonal vulvovaginitis: Secondary | ICD-10-CM | POA: Insufficient documentation

## 2013-08-18 DIAGNOSIS — N83209 Unspecified ovarian cyst, unspecified side: Secondary | ICD-10-CM

## 2013-08-18 DIAGNOSIS — R109 Unspecified abdominal pain: Secondary | ICD-10-CM | POA: Insufficient documentation

## 2013-08-18 LAB — WET PREP, GENITAL: YEAST WET PREP: NONE SEEN

## 2013-08-18 LAB — CBC
HCT: 37.3 % (ref 36.0–46.0)
Hemoglobin: 12.6 g/dL (ref 12.0–15.0)
MCH: 26.4 pg (ref 26.0–34.0)
MCHC: 33.8 g/dL (ref 30.0–36.0)
MCV: 78 fL (ref 78.0–100.0)
PLATELETS: 212 10*3/uL (ref 150–400)
RBC: 4.78 MIL/uL (ref 3.87–5.11)
RDW: 14.3 % (ref 11.5–15.5)
WBC: 10.8 10*3/uL — ABNORMAL HIGH (ref 4.0–10.5)

## 2013-08-18 LAB — URINALYSIS, ROUTINE W REFLEX MICROSCOPIC
Bilirubin Urine: NEGATIVE
GLUCOSE, UA: NEGATIVE mg/dL
HGB URINE DIPSTICK: NEGATIVE
KETONES UR: NEGATIVE mg/dL
Leukocytes, UA: NEGATIVE
Nitrite: NEGATIVE
PROTEIN: NEGATIVE mg/dL
Specific Gravity, Urine: 1.015 (ref 1.005–1.030)
Urobilinogen, UA: 0.2 mg/dL (ref 0.0–1.0)
pH: 7 (ref 5.0–8.0)

## 2013-08-18 LAB — COMPREHENSIVE METABOLIC PANEL
ALBUMIN: 3 g/dL — AB (ref 3.5–5.2)
ALT: 13 U/L (ref 0–35)
AST: 12 U/L (ref 0–37)
Alkaline Phosphatase: 68 U/L (ref 39–117)
BUN: 13 mg/dL (ref 6–23)
CO2: 25 mEq/L (ref 19–32)
CREATININE: 0.75 mg/dL (ref 0.50–1.10)
Calcium: 9 mg/dL (ref 8.4–10.5)
Chloride: 107 mEq/L (ref 96–112)
GFR calc Af Amer: >90 mL/min (ref >90)
GFR calc non Af Amer: >90 mL/min (ref >90)
Glucose, Bld: 89 mg/dL (ref 70–99)
Potassium: 4.5 mEq/L (ref 3.7–5.3)
Sodium: 139 mEq/L (ref 137–147)
TOTAL PROTEIN: 5.8 g/dL — AB (ref 6.0–8.3)
Total Bilirubin: 0.4 mg/dL (ref 0.3–1.2)

## 2013-08-18 MED ORDER — IBUPROFEN 800 MG PO TABS
800.0000 mg | ORAL_TABLET | Freq: Once | ORAL | Status: AC
  Administered 2013-08-18: 800 mg via ORAL
  Filled 2013-08-18: qty 1

## 2013-08-18 MED ORDER — METRONIDAZOLE 500 MG PO TABS
2000.0000 mg | ORAL_TABLET | Freq: Once | ORAL | Status: AC
  Administered 2013-08-18: 2000 mg via ORAL
  Filled 2013-08-18: qty 4

## 2013-08-18 MED ORDER — IBUPROFEN 800 MG PO TABS: 800.0000 mg | ORAL_TABLET | Freq: Three times a day (TID) | ORAL | Status: DC

## 2013-08-18 NOTE — Discharge Instructions (Signed)
Ovarian Cyst An ovarian cyst is a sac filled with fluid or blood. This sac is attached to the ovary. Some cysts go away on their own. Other cysts need treatment.  HOME CARE   Only take medicine as told by your doctor.  Follow up with your doctor as told.  Get regular pelvic exams and Pap tests. GET HELP IF:  Your periods are late, not regular, or painful.  You stop having periods.  Your belly (abdominal) or pelvic pain does not go away.  Your belly becomes large or puffy (swollen).  You have a hard time peeing (totally emptying your bladder).  You have pressure on your bladder.  You have pain during sex.  You feel fullness, pressure, or discomfort in your belly.  You lose weight for no reason.  You feel sick most of the time.  You have a hard time pooping (constipation).  You do not feel like eating.  You develop pimples (acne).  You have an increase in hair on your body and face.  You are gaining weight for no reason.  You think you are pregnant. GET HELP RIGHT AWAY IF:   Your belly pain gets worse.  You feel sick to your stomach (nauseous), and you throw up (vomit).  You have a fever that comes on fast.  You have belly pain while pooping (bowel movement).  Your periods are heavier than usual. MAKE SURE YOU:   Understand these instructions.  Will watch your condition.  Will get help right away if you are not doing well or get worse. Document Released: 12/14/2007 Document Revised: 04/17/2013 Document Reviewed: 03/04/2013 [facility] Patient Information 2014 [place], [place]. Trichomoniasis Trichomoniasis is an infection, caused by the Trichomonas organism, that affects both women and men. In women, the outer female genitalia and the vagina are affected. In men, the penis is mainly affected, but the prostate and other reproductive organs can also be involved. Trichomoniasis is a sexually transmitted disease (STD) and is most often passed to another person  through sexual contact. The majority of people who get trichomoniasis do so from a sexual encounter and are also at risk for other STDs. CAUSES   Sexual intercourse with an infected partner.  It can be present in swimming pools or hot tubs. SYMPTOMS   Abnormal gray-green frothy vaginal discharge in women.  Vaginal itching and irritation in women.  Itching and irritation of the area outside the vagina in women.  Penile discharge with or without pain in males.  Inflammation of the urethra (urethritis), causing painful urination.  Bleeding after sexual intercourse. RELATED COMPLICATIONS  Pelvic inflammatory disease.  Infection of the uterus (endometritis).  Infertility.  Tubal (ectopic) pregnancy.  It can be associated with other STDs, including gonorrhea and chlamydia, hepatitis B, and HIV. COMPLICATIONS DURING PREGNANCY  Early (premature) delivery.  Premature rupture of the membranes (PROM).  Low birth weight. DIAGNOSIS   Visualization of Trichomonas under the microscope from the vagina discharge.  Ph of the vagina greater than 4.5, tested with a test tape.  Trich Rapid Test.  Culture of the organism, but this is not usually needed.  It may be found on a Pap test.  Having a "strawberry cervix,"which means the cervix looks very red like a strawberry. TREATMENT   You may be given medication to fight the infection. Inform your caregiver if you could be or are pregnant. Some medications used to treat the infection should not be taken during pregnancy.  Over-the-counter medications or creams to decrease itching  or irritation may be recommended.  Your sexual partner will need to be treated if infected. HOME CARE INSTRUCTIONS   Take all medication prescribed by your caregiver.  Take over-the-counter medication for itching or irritation as directed by your caregiver.  Do not have sexual intercourse while you have the infection.  Do not douche or wear  tampons.  Discuss your infection with your partner, as your partner may have acquired the infection from you. Or, your partner may have been the person who transmitted the infection to you.  Have your sex partner examined and treated if necessary.  Practice safe, informed, and protected sex.  See your caregiver for other STD testing. SEEK MEDICAL CARE IF:   You still have symptoms after you finish the medication.  You have an oral temperature above 102 F (38.9 C).  You develop belly (abdominal) pain.  You have pain when you urinate.  You have bleeding after sexual intercourse.  You develop a rash.  The medication makes you sick or makes you throw up (vomit). Document Released: 12/21/2000 Document Revised: 09/19/2011 Document Reviewed: 01/16/2009 [facility] Patient Information 2014 [place].

## 2013-08-18 NOTE — ED Notes (Signed)
Did not answer for triage

## 2013-08-18 NOTE — ED Notes (Signed)
Called times 2   no answer  

## 2013-08-18 NOTE — MAU Note (Signed)
Pt presents with with complaints of having blurred vision and lower abdominal pain. She says she does have a history of migraines but they have never affected her vision and the lower abdominal started this morning.

## 2013-08-18 NOTE — ED Notes (Signed)
No answer

## 2013-08-18 NOTE — MAU Provider Note (Signed)
History     CSN: [id number]  Arrival date and time: 08/18/13 1014   First Provider Initiated Contact with Patient 08/18/13 1051      Chief Complaint  Patient presents with  . Possible Pregnancy   HPI Ms. Natasha Smith is a 23 y.o. [phone] who presents to MAU today with complaint of sudden onset pelvic pain this morning. She had her IUD removed because of irregular bleeding on 08/06/13. She had bleeding from 08/11/13 - 08/15/13. She states that the pain is sharp and rated at 8/10 today. She has also had one loose stool yesterday. She denies vaginal bleeding, discharge, fever, N/V or constipation. She denies being sexually active at this time. She also reports occasional blurred vision although none today. She has had borderline blood pressures at [facility] recently. Last note in Epic mentions possibly started BP meds soon. She denies headache, edema, loss of sensation, strength, numbness or tingling.   OB History   Grav Para Term Preterm Abortions TAB SAB Ect Mult Living   4 2 2  0 2 1 1  0 0 2      Past Medical History  Diagnosis Date  . Sickle cell trait   . Abnormal Pap smear   . History of chlamydia infection   . Pregnancy induced hypertension     hx  . Herpes simplex type II infection     Past Surgical History  Procedure Laterality Date  . Knee surgery      Family History  Problem Relation Age of Onset  . Diabetes Maternal Grandmother   . Hypertension Mother   . Hypertension Father   . Heart disease Maternal Grandfather     History  Substance Use Topics  . Smoking status: Never Smoker   . Smokeless tobacco: Never Used  . Alcohol Use: No    Allergies: No Known Allergies  No prescriptions prior to admission    Review of Systems  Constitutional: Negative for fever and malaise/fatigue.  Eyes: Positive for blurred vision.  Gastrointestinal: Positive for abdominal pain and diarrhea. Negative for nausea, vomiting and constipation.  Genitourinary: Negative for dysuria,  urgency and frequency.       Neg - vaginal bleeding, discharge  Neurological: Negative for headaches.   Physical Exam   Blood pressure 154/98, pulse 79, temperature 98.4 F (36.9 C), temperature source Oral, height 5\' 4"  (1.626 m), weight 211 lb (95.709 kg), last menstrual period 08/11/2013.  Physical Exam  Constitutional: She is oriented to person, place, and time. She appears well-developed and well-nourished. No distress.  HENT:  Head: Normocephalic and atraumatic.  Cardiovascular: Normal rate, regular rhythm and normal heart sounds.   Respiratory: Effort normal and breath sounds normal. No respiratory distress.  GI: Soft. Bowel sounds are normal. She exhibits no distension and no mass. There is tenderness (moderate tenderness to palpation of the lower abdomen at midline). There is no rebound and no guarding.  Genitourinary: Uterus is not enlarged and not tender. Cervix exhibits no motion tenderness, no discharge and no friability. Right adnexum displays tenderness. Right adnexum displays no mass. Left adnexum displays no mass and no tenderness. No bleeding around the vagina. Vaginal discharge (moderate amount of thick, white discharg noted) found.  Neurological: She is alert and oriented to person, place, and time.  Skin: Skin is warm and dry. No erythema.  Psychiatric: She has a normal mood and affect.   Results for orders placed during the hospital encounter of 08/18/13 (from the past 24 hour(s))  URINALYSIS, ROUTINE W  REFLEX MICROSCOPIC     Status: None   Collection Time    08/18/13 10:45 AM      Result Value Range   Color, Urine YELLOW  YELLOW   APPearance CLEAR  CLEAR   Specific Gravity, Urine 1.015  1.005 - 1.030   pH 7.0  5.0 - 8.0   Glucose, UA NEGATIVE  NEGATIVE mg/dL   Hgb urine dipstick NEGATIVE  NEGATIVE   Bilirubin Urine NEGATIVE  NEGATIVE   Ketones, ur NEGATIVE  NEGATIVE mg/dL   Protein, ur NEGATIVE  NEGATIVE mg/dL   Urobilinogen, UA 0.2  0.0 - 1.0 mg/dL    Nitrite NEGATIVE  NEGATIVE   Leukocytes, UA NEGATIVE  NEGATIVE  CBC     Status: Abnormal   Collection Time    08/18/13 11:08 AM      Result Value Range   WBC 10.8 (*) 4.0 - 10.5 K/uL   RBC 4.78  3.87 - 5.11 MIL/uL   Hemoglobin 12.6  12.0 - 15.0 g/dL   HCT 16.1  09.6 - 04.5 %   MCV 78.0  78.0 - 100.0 fL   MCH 26.4  26.0 - 34.0 pg   MCHC 33.8  30.0 - 36.0 g/dL   RDW 40.9  81.1 - 91.4 %   Platelets 212  150 - 400 K/uL  COMPREHENSIVE METABOLIC PANEL     Status: Abnormal   Collection Time    08/18/13 11:08 AM      Result Value Range   Sodium 139  137 - 147 mEq/L   Potassium 4.5  3.7 - 5.3 mEq/L   Chloride 107  96 - 112 mEq/L   CO2 25  19 - 32 mEq/L   Glucose, Bld 89  70 - 99 mg/dL   BUN 13  6 - 23 mg/dL   Creatinine, Ser 7.82  0.50 - 1.10 mg/dL   Calcium 9.0  8.4 - 95.6 mg/dL   Total Protein 5.8 (*) 6.0 - 8.3 g/dL   Albumin 3.0 (*) 3.5 - 5.2 g/dL   AST 12  0 - 37 U/L   ALT 13  0 - 35 U/L   Alkaline Phosphatase 68  39 - 117 U/L   Total Bilirubin 0.4  0.3 - 1.2 mg/dL   GFR calc non Af Amer >90  >90 mL/min   GFR calc Af Amer >90  >90 mL/min  WET PREP, GENITAL     Status: Abnormal   Collection Time    08/18/13 11:10 AM      Result Value Range   Yeast Wet Prep HPF POC NONE SEEN  NONE SEEN   Trich, Wet Prep FEW (*) NONE SEEN   Clue Cells Wet Prep HPF POC FEW (*) NONE SEEN   WBC, Wet Prep HPF POC MANY (*) NONE SEEN   US Transvaginal Non-ob  08/18/2013   CLINICAL DATA:  Low abdominal pain.  EXAM: TRANSABDOMINAL AND TRANSVAGINAL ULTRASOUND OF PELVIS  TECHNIQUE: Both transabdominal and transvaginal ultrasound examinations of the pelvis were performed. Transabdominal technique was performed for global imaging of the pelvis including uterus, ovaries, adnexal regions, and pelvic cul-de-sac. It was necessary to proceed with endovaginal exam following the transabdominal exam to visualize the ovaries.  COMPARISON:  01/04/2010  FINDINGS: Uterus  Measurements: 9.1 x 4.2 x 5.4 cm. No fibroids or  other mass visualized.  Endometrium  Thickness: 1.3 cm.  No focal abnormality visualized.  Right ovary  Measurements: 4.0 x 2.0 x 2.3 cm. Multiple small follicles along the periphery of  the right ovary. Small amount of fluid near the right adnexa.  Left ovary  Measurements: 3.9 x 2.2 x 2.8 cm. Few small follicles in the left ovary. There is an ill-defined hypoechoic and heterogeneous area in the left ovary that measures 2.8 x 0.9 x 1.7 cm. This could represent a collapsed cyst.  Other findings  Small amount of free fluid near the right adnexa.  IMPRESSION: No acute abnormality.  Heterogeneous area in the left ovary measures up to 2.8 cm as described. This may be related to an involuting or collapsed cyst.   Electronically Signed   By: Richarda OverlieAdam  Henn M.D.   On: 08/18/2013 12:22   [place] Pelvis Complete  08/18/2013   CLINICAL DATA:  Low abdominal pain.  EXAM: TRANSABDOMINAL AND TRANSVAGINAL ULTRASOUND OF PELVIS  TECHNIQUE: Both transabdominal and transvaginal ultrasound examinations of the pelvis were performed. Transabdominal technique was performed for global imaging of the pelvis including uterus, ovaries, adnexal regions, and pelvic cul-de-sac. It was necessary to proceed with endovaginal exam following the transabdominal exam to visualize the ovaries.  COMPARISON:  01/04/2010  FINDINGS: Uterus  Measurements: 9.1 x 4.2 x 5.4 cm. No fibroids or other mass visualized.  Endometrium  Thickness: 1.3 cm.  No focal abnormality visualized.  Right ovary  Measurements: 4.0 x 2.0 x 2.3 cm. Multiple small follicles along the periphery of the right ovary. Small amount of fluid near the right adnexa.  Left ovary  Measurements: 3.9 x 2.2 x 2.8 cm. Few small follicles in the left ovary. There is an ill-defined hypoechoic and heterogeneous area in the left ovary that measures 2.8 x 0.9 x 1.7 cm. This could represent a collapsed cyst.  Other findings  Small amount of free fluid near the right adnexa.  IMPRESSION: No acute abnormality.   Heterogeneous area in the left ovary measures up to 2.8 cm as described. This may be related to an involuting or collapsed cyst.   Electronically Signed   By: Richarda OverlieAdam  Henn M.D.   On: 08/18/2013 12:22    MAU Course  Procedures None  MDM Ibuprofen given in MAU CBC, CMP, Wet prep, GC/Chlamydia and US today 800 mg Ibuprofen given - patient reports some improvement in pain BP was improved as well Assessment and Plan  A: Trichomonas Ovarian cyst  P: Discharge home Rx for Ibuprofen sent to patient's pharmacy Treated with 2 G Flagyl in MAU today. Partner treatment advised Patient advised to follow-up with [facility] about elevated BPs Warning signs for CVA discussed. [facility] experiences these signs or symptoms.  Patient may return to MAU as needed or if her condition were to change or worsen  Freddi StarrJulie N Ethier, PA-C  08/18/2013, 12:32 PM

## 2013-08-18 NOTE — ED Notes (Signed)
Pt did not answer when called for triage 

## 2013-08-19 LAB — GC/CHLAMYDIA PROBE AMP
CT PROBE, AMP APTIMA: NEGATIVE
GC Probe RNA: NEGATIVE

## 2013-08-19 LAB — POCT PREGNANCY, URINE: Preg Test, Ur: NEGATIVE

## 2013-08-24 ENCOUNTER — Other Ambulatory Visit: Payer: Self-pay | Admitting: Family Medicine

## 2013-08-26 ENCOUNTER — Ambulatory Visit: Payer: Medicaid Other | Admitting: Family Medicine

## 2013-08-30 ENCOUNTER — Telehealth: Payer: Self-pay | Admitting: Family Medicine

## 2013-08-30 NOTE — Telephone Encounter (Signed)
Pt called and needs a letter from our office stating that she can donate plasma. jw

## 2013-08-30 NOTE — Telephone Encounter (Signed)
Will fwd. To PCP for review. Thanks. .Iann Rodier  

## 2013-09-03 NOTE — Telephone Encounter (Signed)
Tried calling patient to clarify what aspects of her medical history need to be reviewed regarding her plasma donation. No answer. Will try again.    Natasha ChancyStephanie Reginold Beale, PGY-3 Family Medicine Resident

## 2013-09-04 NOTE — Telephone Encounter (Signed)
Called patient to see what information was needed for the letter for plasma donation. She reports that they wanted to know that she was cleared for trichomonas, since she was found to have an infection on 08/18/13. She was treated at the time. She reports not being sexually active since.  Although typically she would not need test of cure, I think that since this is related to plasma donation, I would prefer for her to have confirmed test of cure before I can write the letter. Recommended that she be seen for an office visit.  Patient expressed understanding and agreed with plan.   Natasha ChancyStephanie Mariabella Nilsen, PGY-3 Family Medicine Resident

## 2013-10-07 ENCOUNTER — Other Ambulatory Visit (HOSPITAL_COMMUNITY)
Admission: RE | Admit: 2013-10-07 | Discharge: 2013-10-07 | Disposition: A | Discharge status: Home / Self Care | Payer: Medicaid Other | Source: Ambulatory Visit | Attending: Family Medicine | Admitting: Family Medicine

## 2013-10-07 ENCOUNTER — Encounter: Payer: Self-pay | Admitting: Family Medicine

## 2013-10-07 ENCOUNTER — Ambulatory Visit (INDEPENDENT_AMBULATORY_CARE_PROVIDER_SITE_OTHER): Payer: Medicaid Other | Admitting: Family Medicine

## 2013-10-07 VITALS — BP 150/90 | HR 89 | Temp 97.5°F | Ht 64.0 in | Wt 202.0 lb

## 2013-10-07 DIAGNOSIS — B9689 Other specified bacterial agents as the cause of diseases classified elsewhere: Secondary | ICD-10-CM

## 2013-10-07 DIAGNOSIS — N898 Other specified noninflammatory disorders of vagina: Secondary | ICD-10-CM

## 2013-10-07 DIAGNOSIS — A499 Bacterial infection, unspecified: Secondary | ICD-10-CM

## 2013-10-07 DIAGNOSIS — R109 Unspecified abdominal pain: Secondary | ICD-10-CM

## 2013-10-07 DIAGNOSIS — N760 Acute vaginitis: Secondary | ICD-10-CM

## 2013-10-07 DIAGNOSIS — Z113 Encounter for screening for infections with a predominantly sexual mode of transmission: Secondary | ICD-10-CM | POA: Insufficient documentation

## 2013-10-07 LAB — POCT URINALYSIS DIPSTICK
BILIRUBIN UA: NEGATIVE
Blood, UA: NEGATIVE
GLUCOSE UA: NEGATIVE
KETONES UA: NEGATIVE
Leukocytes, UA: NEGATIVE
Nitrite, UA: NEGATIVE
Protein, UA: NEGATIVE
Spec Grav, UA: 1.015
Urobilinogen, UA: 0.2
pH, UA: 7

## 2013-10-07 LAB — POCT WET PREP (WET MOUNT): Clue Cells Wet Prep Whiff POC: POSITIVE

## 2013-10-07 LAB — POCT URINE PREGNANCY: PREG TEST UR: NEGATIVE

## 2013-10-07 MED ORDER — METRONIDAZOLE 500 MG PO TABS: 500.0000 mg | ORAL_TABLET | Freq: Two times a day (BID) | ORAL | Status: DC

## 2013-10-07 NOTE — Assessment & Plan Note (Signed)
Patient presents for evaluation of epigastric and bilateral lower clinic abdominal pain. Pelvic examination and wet prep positive for bacterial vaginosis. The BV is unlikely contributing to her current symptoms. Gonorrhea and Chlamydia cultures pending. Urine dipstick negative for UTI. Urine pregnancy test negative. She has associated signs and symptoms that are likely consistent with viral gastroenteritis. -Patient counseled on conservative management with adequate by mouth intake and Tylenol as needed for pain

## 2013-10-07 NOTE — Patient Instructions (Addendum)
You have bacterial vaginosis, a prescription for Flagyl will be sent to your pharmacy.  I think that you abdominal pain is likely due to a viral illness, drink plenty of liquids over the next few days to prevent dehydration.   Your blood pressure is elevated today likely due to pain. Please return to office for recheck of blood pressure and further discussion of birth control.

## 2013-10-07 NOTE — Assessment & Plan Note (Signed)
Wet Prep consistent with BV -will treat with Flagyl 500 mg X 7 days

## 2013-10-07 NOTE — Progress Notes (Signed)
   Subjective:    Patient ID: Natasha Smith, female    DOB: 1991-03-15, 23 y.o.   MRN: [id number]  HPI 23 year old female presents for evaluation of abdominal pain, patient states that she developed bilateral lower quadrant as well as epigastric abdominal pain last evening, she describes the feeling as sharp in nature, patient does report a three-day history of loose stools as well as nausea, she did have 2 episodes of emesis this morning that were nonbloody and nonbilious, patient is a she 3P2012, she states that her last period was in the beginning of February 2015, she did have one day of spotting one week ago, she participates in unprotected intercourse, she denies dysuria, she denies vaginal discharge, she denies fevers and chills, she's attempted ibuprofen this morning 2 tablets 200 mg which did not provide relief of her abdominal pain, she denies sick contacts, she denies associated upper respiratory tract symptoms including rhinorrhea, nasal congestion, or sore throat   Review of Systems  Constitutional: Positive for fever and chills.  HENT: Positive for congestion.   Respiratory: Positive for shortness of breath.   Cardiovascular: Positive for chest pain.  Gastrointestinal: Positive for nausea, vomiting, abdominal pain and diarrhea. Negative for abdominal distention.       Objective:   Physical Exam Vitals: Reviewed General: Pleasant African American female, no acute distress Cardiac: Regular in rhythm, S1 and S2 present, no murmurs, no heaves or thrills Respiratory: Clear to auscultation bilaterally, normal effort Abdomen: Soft, mild epigastric and bilateral lower quadrant abdominal tenderness, hyperactive bowel sounds GU: Completed in the presence of nursing staff, normal external female anatomy identified, speculum examination performed which identified moderate amount of white vaginal discharge, normal appearing cervix, cultures obtained, bimanual examination identified no  cervical motion tenderness or gross pelvic mass  Urine Pregnancy test negative Urine Dipstick negative Wet Prep +clue cells     Assessment & Plan:  Please see problem specific assessment and plan.

## 2013-10-08 LAB — CERVICOVAGINAL ANCILLARY ONLY
Chlamydia: NEGATIVE
Neisseria Gonorrhea: NEGATIVE

## 2013-10-09 ENCOUNTER — Encounter: Payer: Self-pay | Admitting: Family Medicine

## 2013-10-09 ENCOUNTER — Telehealth: Payer: Self-pay | Admitting: Family Medicine

## 2013-10-09 NOTE — Telephone Encounter (Signed)
Unable to contact patient as number is unavailable.

## 2013-10-09 NOTE — Telephone Encounter (Signed)
Opened in error

## 2013-10-15 ENCOUNTER — Ambulatory Visit (INDEPENDENT_AMBULATORY_CARE_PROVIDER_SITE_OTHER): Payer: Medicaid Other | Admitting: Family Medicine

## 2013-10-15 ENCOUNTER — Encounter: Payer: Self-pay | Admitting: Family Medicine

## 2013-10-15 VITALS — BP 142/84 | HR 98 | Ht 64.0 in | Wt 208.0 lb

## 2013-10-15 DIAGNOSIS — Z331 Pregnant state, incidental: Secondary | ICD-10-CM

## 2013-10-15 DIAGNOSIS — Z3480 Encounter for supervision of other normal pregnancy, unspecified trimester: Secondary | ICD-10-CM

## 2013-10-15 DIAGNOSIS — Z3490 Encounter for supervision of normal pregnancy, unspecified, unspecified trimester: Secondary | ICD-10-CM

## 2013-10-15 DIAGNOSIS — N912 Amenorrhea, unspecified: Secondary | ICD-10-CM

## 2013-10-15 LAB — POCT URINE PREGNANCY: PREG TEST UR: POSITIVE

## 2013-10-15 MED ORDER — PRENATE ELITE 20-0.6-0.4 MG PO TABS: 1.0000 | ORAL_TABLET | Freq: Every day | ORAL | Status: DC

## 2013-10-15 NOTE — Patient Instructions (Signed)
Please follow up with your ob.  We will get an ultrasound to better date you.   Pregnancy - First Trimester During sexual intercourse, millions of sperm go into the vagina. Only 1 sperm will penetrate and fertilize the female egg while it is in the Fallopian tube. One week later, the fertilized egg implants into the wall of the uterus. An embryo begins to develop into a baby. At 6 to 8 weeks, the eyes and face are formed and the heartbeat can be seen on ultrasound. At the end of 12 weeks (first trimester), all the baby's organs are formed. Now that you are pregnant, you will want to do everything you can to have a healthy baby. Two of the most important things are to get good prenatal care and follow your caregiver's instructions. Prenatal care is all the medical care you receive before the baby's birth. It is given to prevent, find, and treat problems during the pregnancy and childbirth. PRENATAL EXAMS  During prenatal visits, your weight, blood pressure, and urine are checked. This is done to make sure you are healthy and progressing normally during the pregnancy.  A pregnant woman should gain 25 to 35 pounds during the pregnancy. However, if you are overweight or underweight, your caregiver will advise you regarding your weight.  Your caregiver will ask and answer questions for you.  Blood work, cervical cultures, other necessary tests, and a Pap test are done during your prenatal exams. These tests are done to check on your health and the probable health of your baby. Tests are strongly recommended and done for HIV with your permission. This is the virus that causes AIDS. These tests are done because medicines can be given to help prevent your baby from being born with this infection should you have been infected without knowing it. Blood work is also used to find out your blood type, previous infections, and follow your blood levels (hemoglobin).  Low hemoglobin (anemia) is common during  pregnancy. Iron and vitamins are given to help prevent this. Later in the pregnancy, blood tests for diabetes will be done along with any other tests if any problems develop.  You may need other tests to make sure you and the baby are doing well. CHANGES DURING THE FIRST TRIMESTER  Your body goes through many changes during pregnancy. They vary from person to person. Talk to your caregiver about changes you notice and are concerned about. Changes can include:  Your menstrual period stops.  The egg and sperm carry the genes that determine what you look like. Genes from you and your partner are forming a baby. The female genes determine whether the baby is a boy or a girl.  Your body increases in girth and you may feel bloated.  Feeling sick to your stomach (nauseous) and throwing up (vomiting). If the vomiting is uncontrollable, call your caregiver.  Your breasts will begin to enlarge and become tender.  Your nipples may stick out more and become darker.  The need to urinate more. Painful urination may mean you have a bladder infection.  Tiring easily.  Loss of appetite.  Cravings for certain kinds of food.  At first, you may gain or lose a couple of pounds.  You may have changes in your emotions from day to day (excited to be pregnant or concerned something may go wrong with the pregnancy and baby).  You may have more vivid and strange dreams. HOME CARE INSTRUCTIONS   It is very important to avoid  all smoking, alcohol and non-prescribed drugs during your pregnancy. These affect the formation and growth of the baby. Avoid chemicals while pregnant to ensure the delivery of a healthy infant.  Start your prenatal visits by the 12th week of pregnancy. They are usually scheduled monthly at first, then more often in the last 2 months before delivery. Keep your caregiver's appointments. Follow your caregiver's instructions regarding medicine use, blood and lab tests, exercise, and  diet.  During pregnancy, you are providing food for you and your baby. Eat regular, well-balanced meals. Choose foods such as meat, fish, milk and other low fat dairy products, vegetables, fruits, and whole-grain breads and cereals. Your caregiver will tell you of the ideal weight gain.  You can help morning sickness by keeping soda crackers at the bedside. Eat a couple before arising in the morning. You may want to use the crackers without salt on them.  Eating 4 to 5 small meals rather than 3 large meals a day also may help the nausea and vomiting.  Drinking liquids between meals instead of during meals also seems to help nausea and vomiting.  A physical sexual relationship may be continued throughout pregnancy if there are no other problems. Problems may be early (premature) leaking of amniotic fluid from the membranes, vaginal bleeding, or belly (abdominal) pain.  Exercise regularly if there are no restrictions. Check with your caregiver or physical therapist if you are unsure of the safety of some of your exercises. Greater weight gain will occur in the last 2 trimesters of pregnancy. Exercising will help:  Control your weight.  Keep you in shape.  Prepare you for labor and delivery.  Help you lose your pregnancy weight after you deliver your baby.  Wear a good support or jogging bra for breast tenderness during pregnancy. This may help if worn during sleep too.  Ask when prenatal classes are available. Begin classes when they are offered.  Do not use hot tubs, steam rooms, or saunas.  Wear your seat belt when driving. This protects you and your baby if you are in an accident.  Avoid raw meat, uncooked cheese, cat litter boxes, and soil used by cats throughout the pregnancy. These carry germs that can cause birth defects in the baby.  The first trimester is a good time to visit your dentist for your dental health. Getting your teeth cleaned is okay. Use a softer toothbrush and  brush gently during pregnancy.  Ask for help if you have financial, counseling, or nutritional needs during pregnancy. Your caregiver will be able to offer counseling for these needs as well as refer you for other special needs.  Do not take any medicines or herbs unless told by your caregiver.  Inform your caregiver if there is any mental or physical domestic violence.  Make a list of emergency phone numbers of family, friends, hospital, and police and fire departments.  Write down your questions. Take them to your prenatal visit.  Do not douche.  Do not cross your legs.  If you have to stand for long periods of time, rotate you feet or take small steps in a circle.  You may have more vaginal secretions that may require a sanitary pad. Do not use tampons or scented sanitary pads. MEDICINES AND DRUG USE IN PREGNANCY  Take prenatal vitamins as directed. The vitamin should contain 1 milligram of folic acid. Keep all vitamins out of reach of children. Only a couple vitamins or tablets containing iron may be fatal to  a baby or young child when ingested.  Avoid use of all medicines, including herbs, over-the-counter medicines, not prescribed or suggested by your caregiver. Only take over-the-counter or prescription medicines for pain, discomfort, or fever as directed by your caregiver. Do not use aspirin, ibuprofen, or naproxen unless directed by your caregiver.  Let your caregiver also know about herbs you may be using.  Alcohol is related to a number of birth defects. This includes fetal alcohol syndrome. All alcohol, in any form, should be avoided completely. Smoking will cause low birth rate and premature babies.  Street or illegal drugs are very harmful to the baby. They are absolutely forbidden. A baby born to an addicted mother will be addicted at birth. The baby will go through the same withdrawal an adult does.  Let your caregiver know about any medicines that you have to take and  for what reason you take them. SEEK MEDICAL CARE IF:  You have any concerns or worries during your pregnancy. It is better to call with your questions if you feel they cannot wait, rather than worry about them. SEEK IMMEDIATE MEDICAL CARE IF:   An unexplained oral temperature above 102 F (38.9 C) develops, or as your caregiver suggests.  You have leaking of fluid from the vagina (birth canal). If leaking membranes are suspected, take your temperature and inform your caregiver of this when you call.  There is vaginal spotting or bleeding. Notify your caregiver of the amount and how many pads are used.  You develop a bad smelling vaginal discharge with a change in the color.  You continue to feel sick to your stomach (nauseated) and have no relief from remedies suggested. You vomit blood or coffee ground-like materials.  You lose more than 2 pounds of weight in 1 week.  You gain more than 2 pounds of weight in 1 week and you notice swelling of your face, hands, feet, or legs.  You gain 5 pounds or more in 1 week (even if you do not have swelling of your hands, face, legs, or feet).  You get exposed to [place] measles and have never had them.  You are exposed to fifth disease or chickenpox.  You develop belly (abdominal) pain. Round ligament discomfort is a common non-cancerous (benign) cause of abdominal pain in pregnancy. Your caregiver still must evaluate this.  You develop headache, fever, diarrhea, pain with urination, or shortness of breath.  You fall or are in a car accident or have any kind of trauma.  There is mental or physical violence in your home. Document Released: 06/21/2001 Document Revised: 03/21/2012 Document Reviewed: 12/23/2008 [facility] Patient Information 2014 [employer], [place].

## 2013-10-16 ENCOUNTER — Telehealth: Payer: Self-pay | Admitting: Family Medicine

## 2013-10-16 NOTE — Telephone Encounter (Signed)
Placed referral. Please let patient know that she should be contacted soon regarding this. If she hasn't heard back in 1 week, she should call back.   Thank you!  Natasha ChancyStephanie Celvin Taney, PGY-3 Family Medicine Resident

## 2013-10-16 NOTE — Telephone Encounter (Signed)
Referral processed Dr. Ebony HailHenley's office will contact pt.  Referral faxed to Dr. Ambrose MantleHenley.

## 2013-10-16 NOTE — Telephone Encounter (Signed)
[facility] so Dr Ambrose MantleHenley to take care of her during her pregnancy Please advise

## 2013-10-16 NOTE — Assessment & Plan Note (Addendum)
UPT positive. Dates uncertain given irregular menses and uncertain LMP.  - Will obtain dating US.  - Patient to follow up with her OB/GYN Dr. Ambrose MantleHenley.  - Repeat manual BP was 120/84 but patient has had borderline elevated BP's in the past and may need to be treated for chronic hypertension during pregnancy. Will not start treatment but for that reason, would like for patient to follow up as soon as possible with her OB.  - Rx for prenatals sent

## 2013-10-16 NOTE — Progress Notes (Signed)
Patient ID: Natasha Smith    DOB: Mar 13, 1991, 23 y.o.   MRN: [id number] --- Subjective:  Natasha Smith is a 23 y.o.female who presents for confirmation of home pregnancy test. Patient's LMP was at the end of March, 3 days of lighter bleeding than usual. Prior to that period was at the end of February, lasted 5 days and was heavier. She reports having irregular periods.  She is not on birth control. She is not taking any medications. She doesn't smoke or drink. She is not taking prenatal vitamins.  She denies any headache, change in vision, lower extremity swelling.  She has had 2 term pregnancies and one abortion. 1st pregnancy was complicated by pre-eclampsia.    ROS: see HPI Past Medical History: reviewed and updated medications and allergies. Social History: Tobacco: none  Objective: Filed Vitals:   10/15/13 1439  BP: 142/84  Pulse: 98   Repeat manual BP: 120/84  Physical Examination:   General appearance - alert, well appearing, and in no distress Chest - clear to auscultation, no wheezes, rales or rhonchi, symmetric air entry Heart - normal rate, regular rhythm, normal S1, S2, no murmurs, rubs, clicks or gallops Abdomen - soft, nontender, fundus not appreciated Extremities - no edema  UPT: positive

## 2013-10-16 NOTE — Telephone Encounter (Signed)
Will fwd to PCP for review (pt has medicaid) .Natasha Smith Natasha Smith

## 2013-10-16 NOTE — Telephone Encounter (Signed)
LMVM for patient to please return call.  Please see MD message below if patient calls back.  Radene OuKristen L Money Mckeithan, CMA

## 2013-10-28 ENCOUNTER — Encounter (HOSPITAL_COMMUNITY): Payer: Self-pay

## 2013-10-28 ENCOUNTER — Telehealth: Payer: Self-pay | Admitting: Family Medicine

## 2013-10-28 ENCOUNTER — Ambulatory Visit (HOSPITAL_COMMUNITY)
Admission: RE | Admit: 2013-10-28 | Discharge: 2013-10-28 | Disposition: A | Discharge status: Home / Self Care | Payer: Medicaid Other | Source: Ambulatory Visit | Attending: Family Medicine | Admitting: Family Medicine

## 2013-10-28 DIAGNOSIS — Z3689 Encounter for other specified antenatal screening: Secondary | ICD-10-CM | POA: Insufficient documentation

## 2013-10-28 DIAGNOSIS — N912 Amenorrhea, unspecified: Secondary | ICD-10-CM

## 2013-10-28 DIAGNOSIS — Z3480 Encounter for supervision of other normal pregnancy, unspecified trimester: Secondary | ICD-10-CM

## 2013-10-28 DIAGNOSIS — O208 Other hemorrhage in early pregnancy: Secondary | ICD-10-CM | POA: Insufficient documentation

## 2013-10-28 NOTE — Telephone Encounter (Signed)
Please let patient know that the US showed an estimated due date of 06/24/14.  She should be seen by her OB doctor as soon as possible for her pregnancy, like we had discussed at her appointment.   Thank you! Marena ChancyStephanie Alianny Toelle, PGY-3 Family Medicine Resident

## 2013-10-28 NOTE — Telephone Encounter (Signed)
Pt notified.  Thaddeus Evitts L Kameran Lallier, CMA  

## 2013-11-13 ENCOUNTER — Inpatient Hospital Stay (HOSPITAL_COMMUNITY): Payer: Medicaid Other

## 2013-11-13 ENCOUNTER — Encounter (HOSPITAL_COMMUNITY): Payer: Self-pay | Admitting: *Deleted

## 2013-11-13 ENCOUNTER — Inpatient Hospital Stay (HOSPITAL_COMMUNITY)
Admission: AD | Admit: 2013-11-13 | Discharge: 2013-11-13 | Disposition: A | Discharge status: Home / Self Care | Payer: Medicaid Other | Source: Ambulatory Visit | Attending: Obstetrics & Gynecology | Admitting: Obstetrics & Gynecology

## 2013-11-13 DIAGNOSIS — R109 Unspecified abdominal pain: Secondary | ICD-10-CM | POA: Insufficient documentation

## 2013-11-13 DIAGNOSIS — O200 Threatened abortion: Secondary | ICD-10-CM

## 2013-11-13 DIAGNOSIS — O99019 Anemia complicating pregnancy, unspecified trimester: Secondary | ICD-10-CM | POA: Insufficient documentation

## 2013-11-13 DIAGNOSIS — O98519 Other viral diseases complicating pregnancy, unspecified trimester: Secondary | ICD-10-CM | POA: Insufficient documentation

## 2013-11-13 DIAGNOSIS — D573 Sickle-cell trait: Secondary | ICD-10-CM | POA: Insufficient documentation

## 2013-11-13 DIAGNOSIS — A6000 Herpesviral infection of urogenital system, unspecified: Secondary | ICD-10-CM | POA: Insufficient documentation

## 2013-11-13 LAB — URINALYSIS, ROUTINE W REFLEX MICROSCOPIC
Bilirubin Urine: NEGATIVE
Glucose, UA: NEGATIVE mg/dL
KETONES UR: NEGATIVE mg/dL
LEUKOCYTES UA: NEGATIVE
NITRITE: NEGATIVE
PH: 6.5 (ref 5.0–8.0)
Protein, ur: 30 mg/dL — AB
Specific Gravity, Urine: 1.025 (ref 1.005–1.030)
Urobilinogen, UA: 1 mg/dL (ref 0.0–1.0)

## 2013-11-13 LAB — WET PREP, GENITAL
TRICH WET PREP: NONE SEEN
Yeast Wet Prep HPF POC: NONE SEEN

## 2013-11-13 LAB — URINE MICROSCOPIC-ADD ON

## 2013-11-13 LAB — CBC
HCT: 38.4 % (ref 36.0–46.0)
Hemoglobin: 13.4 g/dL (ref 12.0–15.0)
MCH: 26.9 pg (ref 26.0–34.0)
MCHC: 34.9 g/dL (ref 30.0–36.0)
MCV: 77 fL — ABNORMAL LOW (ref 78.0–100.0)
PLATELETS: 247 10*3/uL (ref 150–400)
RBC: 4.99 MIL/uL (ref 3.87–5.11)
RDW: 13.7 % (ref 11.5–15.5)
WBC: 16.4 10*3/uL — ABNORMAL HIGH (ref 4.0–10.5)

## 2013-11-13 LAB — HCG, QUANTITATIVE, PREGNANCY: HCG, BETA CHAIN, QUANT, S: 53621 m[IU]/mL — AB (ref <5)

## 2013-11-13 MED ORDER — OXYCODONE-ACETAMINOPHEN 5-325 MG PO TABS: 2.0000 | ORAL_TABLET | ORAL | Status: DC | PRN

## 2013-11-13 MED ORDER — HYDROMORPHONE HCL PF 1 MG/ML IJ SOLN
2.0000 mg | Freq: Once | INTRAMUSCULAR | Status: AC
  Administered 2013-11-13: 2 mg via INTRAMUSCULAR
  Filled 2013-11-13: qty 2

## 2013-11-13 MED ORDER — OXYCODONE-ACETAMINOPHEN 5-325 MG PO TABS
1.0000 | ORAL_TABLET | Freq: Once | ORAL | Status: AC
  Administered 2013-11-13: 1 via ORAL
  Filled 2013-11-13: qty 1

## 2013-11-13 MED ORDER — PROMETHAZINE HCL 25 MG PO TABS
25.0000 mg | ORAL_TABLET | Freq: Once | ORAL | Status: AC
  Administered 2013-11-13: 25 mg via ORAL
  Filled 2013-11-13: qty 1

## 2013-11-13 MED ORDER — PROMETHAZINE HCL 25 MG PO TABS: 25.0000 mg | ORAL_TABLET | Freq: Four times a day (QID) | ORAL | Status: DC | PRN

## 2013-11-13 NOTE — MAU Provider Note (Signed)
History     CSN: [id number]  Arrival date and time: 11/13/13 4098   First Provider Initiated Contact with Patient 11/13/13 [phone]      No chief complaint on file.  HPI Comments: Natasha Smith 23 y.o. [redacted]w[redacted]d [id number] presents to MAU with vaginal bleeding that started this morning at 3 am. She also has cramping that is " 8 " on 1-10 scale. She is also nauseated and vomited in MAU. She is planning her care with Dr Ambrose Mantle but has missed her first appointment. She has had an ultrasound for dating and has a known IUP. Her blood type is A positive.      Past Medical History  Diagnosis Date  . Sickle cell trait   . Abnormal Pap smear   . History of chlamydia infection   . Pregnancy induced hypertension     hx  . Herpes simplex type II infection     Past Surgical History  Procedure Laterality Date  . Knee surgery      Family History  Problem Relation Age of Onset  . Diabetes Maternal Grandmother   . Hypertension Mother   . Hypertension Father   . Heart disease Maternal Grandfather     History  Substance Use Topics  . Smoking status: Never Smoker   . Smokeless tobacco: Never Used  . Alcohol Use: No    Allergies: No Known Allergies  Prescriptions prior to admission  Medication Sig Dispense Refill  . acetaminophen (TYLENOL) 500 MG tablet Take 500 mg by mouth every 6 (six) hours as needed for moderate pain.      Marland Kitchen ibuprofen (ADVIL,MOTRIN) 200 MG tablet Take 400 mg by mouth every 6 (six) hours as needed for moderate pain.      . Prenatal-FeAspGly-Methylfol-FA (PRENATE ELITE) 20-0.6-0.4 MG TABS Take 1 tablet by mouth daily.  90 tablet  3    Review of Systems  Constitutional: Negative.   HENT: Negative.   Eyes: Negative.   Respiratory: Negative.   Cardiovascular: Negative.   Gastrointestinal: Positive for nausea, vomiting and abdominal pain.  Genitourinary: Negative.   Musculoskeletal: Negative.   Skin: Negative.   Neurological: Negative.   Psychiatric/Behavioral:  Negative.    Physical Exam   Blood pressure 128/78, pulse 79, temperature 97.7 F (36.5 C), temperature source Oral, resp. rate 20, height 5\' 4"  (1.626 m), weight 0 kg (0 lb), last menstrual period 09/07/2013.  Physical Exam  Constitutional: She is oriented to person, place, and time. She appears well-developed and well-nourished. She appears distressed.  Appears in pain  HENT:  Head: Normocephalic and atraumatic.  Eyes: Pupils are equal, round, and reactive to light.  Cardiovascular: Normal rate, regular rhythm and normal heart sounds.   Respiratory: Effort normal and breath sounds normal. No respiratory distress.  GI: Soft. Bowel sounds are normal. There is tenderness.  Genitourinary:  Genital:External small amount blood Vaginal:Small amount blood Cervix:closed Bimanual:Tenderness in uterus   Musculoskeletal: Normal range of motion.  Neurological: She is alert and oriented to person, place, and time.  Skin: Skin is warm and dry.  Psychiatric: She has a normal mood and affect. Her behavior is normal. Judgment and thought content normal.   Results for orders placed during the hospital encounter of 11/13/13 (from the past 24 hour(s))  URINALYSIS, ROUTINE W REFLEX MICROSCOPIC     Status: Abnormal   Collection Time    11/13/13  7:45 AM      Result Value Ref Range   Color, Urine YELLOW  YELLOW  APPearance CLOUDY (*) CLEAR   Specific Gravity, Urine 1.025  1.005 - 1.030   pH 6.5  5.0 - 8.0   Glucose, UA NEGATIVE  NEGATIVE mg/dL   Hgb urine dipstick LARGE (*) NEGATIVE   Bilirubin Urine NEGATIVE  NEGATIVE   Ketones, ur NEGATIVE  NEGATIVE mg/dL   Protein, ur 30 (*) NEGATIVE mg/dL   Urobilinogen, UA 1.0  0.0 - 1.0 mg/dL   Nitrite NEGATIVE  NEGATIVE   Leukocytes, UA NEGATIVE  NEGATIVE  URINE MICROSCOPIC-ADD ON     Status: Abnormal   Collection Time    11/13/13  7:45 AM      Result Value Ref Range   Squamous Epithelial / LPF MANY (*) RARE   WBC, UA 0-2  <3 WBC/hpf   RBC / HPF  3-6  <3 RBC/hpf   Bacteria, UA RARE  RARE   Urine-Other AMORPHOUS URATES/PHOSPHATES    WET PREP, GENITAL     Status: Abnormal   Collection Time    11/13/13  8:22 AM      Result Value Ref Range   Yeast Wet Prep HPF POC NONE SEEN  NONE SEEN   Trich, Wet Prep NONE SEEN  NONE SEEN   Clue Cells Wet Prep HPF POC MANY (*) NONE SEEN   WBC, Wet Prep HPF POC FEW (*) NONE SEEN  CBC     Status: Abnormal   Collection Time    11/13/13  8:43 AM      Result Value Ref Range   WBC 16.4 (*) 4.0 - 10.5 K/uL   RBC 4.99  3.87 - 5.11 MIL/uL   Hemoglobin 13.4  12.0 - 15.0 g/dL   HCT 16.138.4  09.636.0 - 04.546.0 %   MCV 77.0 (*) 78.0 - 100.0 fL   MCH 26.9  26.0 - 34.0 pg   MCHC 34.9  30.0 - 36.0 g/dL   RDW 40.913.7  81.111.5 - 91.415.5 %   Platelets 247  150 - 400 K/uL  HCG, QUANTITATIVE, PREGNANCY     Status: Abnormal   Collection Time    11/13/13  8:45 AM      Result Value Ref Range   hCG, Beta Chain, Quant, [place] [id number] (*) <5 mIU/mL   [place] Ob Transvaginal  11/13/2013   CLINICAL DATA:  Pregnant patient with cramping and vaginal bleeding.  EXAM: TRANSVAGINAL OB ULTRASOUND  TECHNIQUE: Transvaginal ultrasound was performed for complete evaluation of the gestation as well as the maternal uterus, adnexal regions, and pelvic cul-de-sac.  COMPARISON:  Ob ultrasound 10/28/2013.  FINDINGS: Intrauterine gestational sac: Visualized/normal in shape. A moderate-sized subchorionic hemorrhage is identified measuring approximately 0.9 cm by 3.1 cm by 1.0 cm. Tiny subchorionic hemorrhage is visualized on the prior exam.  Yolk sac:  Visualized.  Embryo:  Visualized.  Cardiac Activity: Detected.  Heart Rate: 175 bpm  CRL:   1.75  cm   8 w 2 d                  US EDC: 06/23/2014  Maternal uterus/adnexae: Unremarkable.  IMPRESSION: Single living intrauterine pregnancy. Moderate subchorionic hemorrhage is present.   Electronically Signed   By: Drusilla Kannerhomas  Dalessio M.D.   On: 11/13/2013 09:44     MAU Course  Procedures  MDM  Wet prep, GC, Chlamydia, CBC,  UA, U/S, Quant Percocet/ phenergan for nausea and pain Returned from ultrasound with significant pain/ Dilaudid 2 mg IM now/ pain controlled completely  Assessment and Plan   A: Threatened Miscarriage  P: Note  for work Percocet/ Phenergan Repeat BHCG in 48 hours in MAU Follow up with Dr Elenor QuinonesHenley     Lamerle Jabs M Alexey Rhoads 11/13/2013, 8:27 AM

## 2013-11-13 NOTE — MAU Note (Signed)
PT SAYS SHE WAS ASLEEP - AT 0330- AWOKE  WITH BLOOD  ALL IN BED-   DID  NOT COME IN EARLIER-  NO WAY.    IN RM 10 - NO BLEEDING NOW.     HAS CRAMPS- STARTED  WITH BLEEDING.  . SAYS VOMITED YELLOW FLUID  AT 0700-   NO MEDS FOR N/V.      PREG WAS CONFIRMED  [facility]-  BUT PLAN TO GET PNC WITH DR HENLEY.   SHE HAD APPOINTMENT  WITH DR HENLEY- ON 4-24-  MISSED .     OFFICE CALLED HER BACK YESTERDAY- NEXT APPOINTMENT - PT NEEDS TO CALL THEM.        LAST HSV OUTBREAK-   JAN 2015.Marland Kitchen.  LAST SEX-   APRIL

## 2013-11-13 NOTE — Discharge Instructions (Signed)
Threatened Miscarriage  A threatened miscarriage is a pregnancy that may end. It may be marked by bleeding during the first 20 weeks of pregnancy. Often, the pregnancy can continue without any more problems. You may be asked to stop:  Having sex (intercourse).  Having orgasms.  Using tampons.  Exercising.  Doing heavy physical activity and work. HOME CARE   Your doctor may tell you to take bed rest and to stop activities and work.  Write down the number of pads you use each day. Write down how often you change pads. Write down how soaked they are.  Follow your doctor's advice for follow-up visits and tests.  If your blood type is Rh-negative and the father's blood is Rh-positive (or is not known), you may get a shot to protect the baby.  If you have a miscarriage, save all the tissue you pass in a container. Take the container to your doctor. GET HELP RIGHT AWAY IF:   You have bad cramps or pain in your belly (abdomen), lower belly, or back.  You have a fever or chills.  Your bleeding gets worse or you pass large clots of blood or tissue. Save this tissue to show your doctor.  You feel lightheaded, weak, dizzy, or pass out (faint).  You have a gush of fluid from your vagina. MAKE SURE YOU:   Understand these instructions.  Will watch your condition.  Will get help right away if you are not doing well or get worse. Document Released: 06/09/2008 Document Revised: 09/19/2011 Document Reviewed: 07/13/2009 [facility] Patient Information 2014 [place], [place]. Subchorionic Hematoma A subchorionic hematoma is a gathering of blood between the outer wall of the placenta and the inner wall of the womb (uterus). The placenta is the organ that connects the fetus to the wall of the uterus. The placenta performs the feeding, breathing (oxygen to the fetus), and waste removal (excretory work) of the fetus.  Subchorionic hematoma is the most common abnormality found on a result from  ultrasonography done during the first trimester or early second trimester of pregnancy. If there has been little or no vaginal bleeding, early small hematomas usually shrink on their own and do not affect your baby or pregnancy. The blood is gradually absorbed over 1 2 weeks. When bleeding starts later in pregnancy or the hematoma is larger or occurs in an older pregnant woman, the outcome may not be as good. Larger hematomas may get bigger, which increases the chances for miscarriage. Subchorionic hematoma also increases the risk of premature detachment of the placenta from the uterus, preterm (premature) labor, and stillbirth. HOME CARE INSTRUCTIONS   Stay on bed rest if your health care provider recommends this. Although bed rest will not prevent more bleeding or prevent a miscarriage, your health care provider may recommend bed rest until you are advised otherwise.  Avoid heavy lifting (more than 10 lb [4.5 kg]), exercise, sexual intercourse, or douching as directed by your health care provider.  Keep track of the number of pads you use each day and how soaked (saturated) they are. Write down this information.  Do not use tampons.  Keep all follow-up appointments as directed by your health care provider. Your health care provider may ask you to have follow-up blood tests or ultrasound tests or both. SEEK IMMEDIATE MEDICAL CARE IF:   You have severe cramps in your stomach, back, abdomen, or pelvis.  You have a fever.  You pass large clots or tissue. Save any tissue for your health  care provider to look at.  Your bleeding increases or you become lightheaded, feel weak, or have fainting episodes. Document Released: 10/12/2006 Document Revised: 04/17/2013 Document Reviewed: 01/24/2013 [facility] Patient Information 2014 [place], [place].

## 2013-11-14 ENCOUNTER — Telehealth: Payer: Self-pay

## 2013-11-14 DIAGNOSIS — B9689 Other specified bacterial agents as the cause of diseases classified elsewhere: Secondary | ICD-10-CM

## 2013-11-14 DIAGNOSIS — N760 Acute vaginitis: Principal | ICD-10-CM

## 2013-11-14 LAB — GC/CHLAMYDIA PROBE AMP
CT PROBE, AMP APTIMA: NEGATIVE
GC Probe RNA: NEGATIVE

## 2013-11-14 MED ORDER — METRONIDAZOLE 500 MG PO TABS: 500.0000 mg | ORAL_TABLET | Freq: Two times a day (BID) | ORAL | Status: DC

## 2013-11-14 NOTE — Telephone Encounter (Signed)
Message copied by Louanna RawAMPBELL, Keondrick Dilks M on Thu Nov 14, 2013 10:06 AM ------      Message from: Adam PhenixARNOLD, JAMES G      Created: Thu Nov 14, 2013  9:30 AM       BV Flagyl 500 mg BID 7 d ------

## 2013-11-14 NOTE — Telephone Encounter (Signed)
Pt. Returned call regarding results. Pt. Informed of BV and of medication at her pharmacy. Pt. Verbalized understanding and gratitude. NO questions or concerns.

## 2013-11-14 NOTE — Telephone Encounter (Signed)
Attempted to call pt. No answer. Left message stating we are calling with results, please call clinic.

## 2013-11-15 ENCOUNTER — Inpatient Hospital Stay (HOSPITAL_COMMUNITY)
Admission: AD | Admit: 2013-11-15 | Discharge: 2013-11-15 | Disposition: A | Discharge status: Home / Self Care | Payer: Medicaid Other | Source: Ambulatory Visit | Attending: Obstetrics and Gynecology | Admitting: Obstetrics and Gynecology

## 2013-11-15 DIAGNOSIS — O26859 Spotting complicating pregnancy, unspecified trimester: Secondary | ICD-10-CM

## 2013-11-15 NOTE — MAU Provider Note (Signed)
Attestation of Attending Supervision of Advanced Practitioner (PA/CNM/NP): Evaluation and management procedures were performed by the Advanced Practitioner under my supervision and collaboration.  I have reviewed the Advanced Practitioner's note and chart, and I agree with the management and plan.  Reva Boresanya S Pratt, MD [facility] Attending 11/15/2013 4:38 PM

## 2013-11-15 NOTE — MAU Provider Note (Signed)
HPI:  Ms. Natasha Smith is a 23 y.o. female 8028w3d [phone] [id number] who presents for a follow up beta hcg. Pt is not having any pain, and her bleeding has stopped. Patient was seen on 5/6 in MAU and had an US that showed a moderate subchorionic hemorrhage and an IUP with cardiac activity. [facility]    Objective:  GENERAL: Well-developed, well-nourished female in no acute distress.  HEENT: Normocephalic, atraumatic.   LUNGS: Effort normal HEART: Regular rate  SKIN: Warm, dry and without erythema PSYCH: Normal mood and affect   Filed Vitals:   11/15/13 1042  BP: 139/87  Pulse: 80  Temp: 98.4 F (36.9 C)  Resp: 16     CLINICAL DATA: Pregnant patient with cramping and vaginal bleeding.  EXAM:  TRANSVAGINAL OB ULTRASOUND  TECHNIQUE:  Transvaginal ultrasound was performed for complete evaluation of the  gestation as well as the maternal uterus, adnexal regions, and  pelvic cul-de-sac.  COMPARISON: Ob ultrasound 10/28/2013.  FINDINGS:  Intrauterine gestational sac: Visualized/normal in shape. A  moderate-sized subchorionic hemorrhage is identified measuring  approximately 0.9 cm by 3.1 cm by 1.0 cm. Tiny subchorionic  hemorrhage is visualized on the prior exam.  Yolk sac: Visualized.  Embryo: Visualized.  Cardiac Activity: Detected.  Heart Rate: 175 bpm  CRL: 1.75 cm 8 w 2 d US EDC: 06/23/2014  Maternal uterus/adnexae: Unremarkable.  IMPRESSION:  Single living intrauterine pregnancy. Moderate subchorionic  hemorrhage is present.   MDM Discussed US results with patient.  It is not necessary to repeat beta hcg level at this time.  A:  IUP with cardiac activity; HR 175  Subchorionic hemorrhage; moderate   P:  Discharge home in stable condition  Pt advised to return to MAU with any pain or bleeding Keep appointment with PCP on Tuesday.  Warning signs discussed   Iona HansenJennifer Irene Bronwyn Belasco, NP 11/15/2013 4:22 PM

## 2013-11-15 NOTE — MAU Note (Signed)
Patient to MAU for a repeat BHCG. Patient denies bleeding or pain.

## 2013-12-11 ENCOUNTER — Inpatient Hospital Stay (HOSPITAL_COMMUNITY)
Admission: AD | Admit: 2013-12-11 | Discharge: 2013-12-11 | Disposition: A | Discharge status: Home / Self Care | Payer: Medicaid Other | Source: Ambulatory Visit | Attending: Obstetrics & Gynecology | Admitting: Obstetrics & Gynecology

## 2013-12-11 ENCOUNTER — Encounter (HOSPITAL_COMMUNITY): Payer: Self-pay | Admitting: *Deleted

## 2013-12-11 ENCOUNTER — Inpatient Hospital Stay (HOSPITAL_COMMUNITY): Payer: Medicaid Other

## 2013-12-11 DIAGNOSIS — O2650 Maternal hypotension syndrome, unspecified trimester: Secondary | ICD-10-CM | POA: Insufficient documentation

## 2013-12-11 DIAGNOSIS — R51 Headache: Secondary | ICD-10-CM | POA: Insufficient documentation

## 2013-12-11 DIAGNOSIS — W19XXXA Unspecified fall, initial encounter: Secondary | ICD-10-CM

## 2013-12-11 DIAGNOSIS — Y9229 Other specified public building as the place of occurrence of the external cause: Secondary | ICD-10-CM | POA: Insufficient documentation

## 2013-12-11 DIAGNOSIS — O210 Mild hyperemesis gravidarum: Secondary | ICD-10-CM | POA: Insufficient documentation

## 2013-12-11 DIAGNOSIS — O219 Vomiting of pregnancy, unspecified: Secondary | ICD-10-CM

## 2013-12-11 DIAGNOSIS — W1809XA Striking against other object with subsequent fall, initial encounter: Secondary | ICD-10-CM | POA: Insufficient documentation

## 2013-12-11 LAB — CBC
HEMATOCRIT: 37.2 % (ref 36.0–46.0)
Hemoglobin: 13.2 g/dL (ref 12.0–15.0)
MCH: 27.5 pg (ref 26.0–34.0)
MCHC: 35.5 g/dL (ref 30.0–36.0)
MCV: 77.5 fL — ABNORMAL LOW (ref 78.0–100.0)
PLATELETS: 225 10*3/uL (ref 150–400)
RBC: 4.8 MIL/uL (ref 3.87–5.11)
RDW: 13.6 % (ref 11.5–15.5)
WBC: 17.2 10*3/uL — AB (ref 4.0–10.5)

## 2013-12-11 LAB — COMPREHENSIVE METABOLIC PANEL
ALK PHOS: 75 U/L (ref 39–117)
ALT: 11 U/L (ref 0–35)
AST: 10 U/L (ref 0–37)
Albumin: 3.7 g/dL (ref 3.5–5.2)
BILIRUBIN TOTAL: 0.7 mg/dL (ref 0.3–1.2)
BUN: 13 mg/dL (ref 6–23)
CHLORIDE: 98 meq/L (ref 96–112)
CO2: 24 meq/L (ref 19–32)
Calcium: 10 mg/dL (ref 8.4–10.5)
Creatinine, Ser: 0.59 mg/dL (ref 0.50–1.10)
GFR calc Af Amer: >90 mL/min (ref >90)
GFR calc non Af Amer: >90 mL/min (ref >90)
Glucose, Bld: 86 mg/dL (ref 70–99)
POTASSIUM: 4.2 meq/L (ref 3.7–5.3)
Sodium: 136 mEq/L — ABNORMAL LOW (ref 137–147)
Total Protein: 7.1 g/dL (ref 6.0–8.3)

## 2013-12-11 LAB — URINALYSIS, ROUTINE W REFLEX MICROSCOPIC
Bilirubin Urine: NEGATIVE
Glucose, UA: NEGATIVE mg/dL
Hgb urine dipstick: NEGATIVE
Ketones, ur: 15 mg/dL — AB
Nitrite: NEGATIVE
PROTEIN: NEGATIVE mg/dL
Specific Gravity, Urine: >1.03 — ABNORMAL HIGH (ref 1.005–1.030)
Urobilinogen, UA: 1 mg/dL (ref 0.0–1.0)
pH: 6 (ref 5.0–8.0)

## 2013-12-11 LAB — URINE MICROSCOPIC-ADD ON

## 2013-12-11 MED ORDER — ONDANSETRON 8 MG PO TBDP
8.0000 mg | ORAL_TABLET | Freq: Once | ORAL | Status: DC
  Filled 2013-12-11: qty 1

## 2013-12-11 MED ORDER — PROMETHAZINE HCL 25 MG/ML IJ SOLN
25.0000 mg | Freq: Once | INTRAVENOUS | Status: AC
  Administered 2013-12-11: 25 mg via INTRAVENOUS
  Filled 2013-12-11: qty 1

## 2013-12-11 NOTE — Discharge Instructions (Signed)
Nausea medication to take during pregnancy:   Unisom (doxylamine succinate 25 mg tablets) Take one tablet daily at bedtime. If symptoms are not adequately controlled, the dose can be increased to a maximum recommended dose of two tablets daily (1/2 tablet in the morning, 1/2 tablet mid-afternoon and one at bedtime).  Vitamin B6 100mg  tablets. Take one tablet twice a day (up to 200 mg per day).   EATING GUIDELINES  Eat 5 to 6 small meals daily instead of 3 large meals.  Avoid foods with strong smells.  Avoid drinking 30 minutes before and after meals.  Avoid fried or high-fat foods, such as butter and cream sauces.  Starchy foods are usually well-tolerated, such as cereal, toast, bread, potatoes, pasta, rice, and pretzels.  Eat crackers before you get out of bed in the morning.  Avoid spicy foods.  Ginger may help with nausea. Add  tsp ginger to hot tea or choose ginger tea.  Continue to take your prenatal vitamins as directed by your caregiver. SAMPLE MEAL PLAN Breakfast    cup oatmeal  1 slice toast  1 tsp heart-healthy margarine  1 tsp jelly  1 scrambled egg Midmorning Snack   1 cup low-fat yogurt Lunch   Plain ham sandwich  Carrot or celery sticks  1 small apple  3 graham crackers Midafternoon Snack   Cheese and crackers Dinner  4 oz pork tenderloin  1 small baked potato  1 tsp margarine   cup broccoli   cup grapes Evening Snack  1 cup pudding Document Released: 04/24/2007 Document Revised: 09/19/2011 Document Reviewed: 11/27/2012 ExitCare Patient Information 2014 [place], [place].

## 2013-12-11 NOTE — MAU Note (Signed)
Have been n/v since pregnancy last 2 days has been the worst.Tried to soup unable to keep anything down.  Went to [employer] today and passed out in the store, fell back and has been having constant back pain since.  Has been having vaginal bleeding off and on since pregnancy some days lighter and other days like a period.  Today bleeding was light.  No bleeding noticed since fall.

## 2013-12-11 NOTE — MAU Provider Note (Signed)
History     CSN: [id number]  Arrival date and time: 12/11/13 2042   First Provider Initiated Contact with Patient 12/11/13 2124      Chief Complaint  Patient presents with  . Vaginal Bleeding  . Morning Sickness  . Near Syncope   HPI  Natasha Smith is a 23 y.o. [id number] at [redacted]w[redacted]d who presents today with nausea and vomiting. She has had it for the entire pregnancy. Nausea has been worse for the last 3 days. Nothing down. Today at [employer] she saw spots and hit the floor. She landed on her back. She states that she hit her head. She had a headache, but it is better now. She has had bleeding off and on the entire pregnancy. She states that some days it is like a period, and some days it is light. She states that today it is light.   Past Medical History  Diagnosis Date  . Sickle cell trait   . Abnormal Pap smear   . History of chlamydia infection   . Pregnancy induced hypertension     hx  . Herpes simplex type II infection     Past Surgical History  Procedure Laterality Date  . Knee surgery      Family History  Problem Relation Age of Onset  . Diabetes Maternal Grandmother   . Hypertension Mother   . Hypertension Father   . Heart disease Maternal Grandfather     History  Substance Use Topics  . Smoking status: Never Smoker   . Smokeless tobacco: Never Used  . Alcohol Use: No    Allergies: No Known Allergies  Prescriptions prior to admission  Medication Sig Dispense Refill  . ibuprofen (ADVIL,MOTRIN) 200 MG tablet Take 400 mg by mouth every 6 (six) hours as needed for moderate pain.      . metroNIDAZOLE (FLAGYL) 500 MG tablet Take 1 tablet (500 mg total) by mouth 2 (two) times daily.  14 tablet  0  . oxyCODONE-acetaminophen (PERCOCET/ROXICET) 5-325 MG per tablet Take 2 tablets by mouth every 4 (four) hours as needed for severe pain.  10 tablet  0  . Prenatal-FeAspGly-Methylfol-FA (PRENATE ELITE) 20-0.6-0.4 MG TABS Take 1 tablet by mouth daily.  90 tablet  3  .  promethazine (PHENERGAN) 25 MG tablet Take 1 tablet (25 mg total) by mouth every 6 (six) hours as needed for nausea or vomiting.  30 tablet  0    ROS Physical Exam   Blood pressure 121/58, pulse 94, temperature 98 F (36.7 C), temperature source Oral, resp. rate 18, height 5\' 4"  (1.626 m), weight 92.67 kg (204 lb 4.8 oz), last menstrual period 09/07/2013.  Physical Exam  Nursing note and vitals reviewed. Constitutional: She is oriented to person, place, and time. She appears well-developed and well-nourished. No distress.  HENT:  Head: Normocephalic and atraumatic.  Eyes: Pupils are equal, round, and reactive to light.  Cardiovascular: Normal rate.   Respiratory: Effort normal.  GI: Soft. There is no tenderness.  Neurological: She is alert and oriented to person, place, and time.  Skin: Skin is warm and dry.  Psychiatric: She has a normal mood and affect.    MAU Course  Procedures  Results for orders placed during the hospital encounter of 12/11/13 (from the past 24 hour(s))  URINALYSIS, ROUTINE W REFLEX MICROSCOPIC     Status: Abnormal   Collection Time    12/11/13  9:17 PM      Result Value Ref Range   Color, Urine  YELLOW  YELLOW   APPearance CLEAR  CLEAR   Specific Gravity, Urine >1.030 (*) 1.005 - 1.030   pH 6.0  5.0 - 8.0   Glucose, UA NEGATIVE  NEGATIVE mg/dL   Hgb urine dipstick NEGATIVE  NEGATIVE   Bilirubin Urine NEGATIVE  NEGATIVE   Ketones, ur 15 (*) NEGATIVE mg/dL   Protein, ur NEGATIVE  NEGATIVE mg/dL   Urobilinogen, UA 1.0  0.0 - 1.0 mg/dL   Nitrite NEGATIVE  NEGATIVE   Leukocytes, UA TRACE (*) NEGATIVE  URINE MICROSCOPIC-ADD ON     Status: Abnormal   Collection Time    12/11/13  9:17 PM      Result Value Ref Range   Squamous Epithelial / LPF FEW (*) RARE   WBC, UA 0-2  <3 WBC/hpf   RBC / HPF 0-2  <3 RBC/hpf   Bacteria, UA FEW (*) RARE  CBC     Status: Abnormal   Collection Time    12/11/13 10:19 PM      Result Value Ref Range   WBC 17.2 (*) 4.0 -  10.5 K/uL   RBC 4.80  3.87 - 5.11 MIL/uL   Hemoglobin 13.2  12.0 - 15.0 g/dL   HCT 16.137.2  09.636.0 - 04.546.0 %   MCV 77.5 (*) 78.0 - 100.0 fL   MCH 27.5  26.0 - 34.0 pg   MCHC 35.5  30.0 - 36.0 g/dL   RDW 40.913.6  81.111.5 - 91.415.5 %   Platelets 225  150 - 400 K/uL   2321: Patient reports that nausea has improved with phenergan. Neuro exam normal. Will dc home with phenergan, Zofran and diclegis.   Assessment and Plan   1. Nausea/vomiting in pregnancy   2. Fall    Comfort measures reviewed Take meds on a daily scheduled Add diclegis Return to MAU as needed  Follow-up Information   Schedule an appointment as soon as [facility] [place]   Contact information:   [address] [place] [id number] [phone]       Tawnya CrookHeather Donovan Hogan 12/11/2013, 11:21 PM

## 2013-12-12 ENCOUNTER — Encounter: Payer: Medicaid Other | Admitting: Family Medicine

## 2013-12-19 ENCOUNTER — Ambulatory Visit (INDEPENDENT_AMBULATORY_CARE_PROVIDER_SITE_OTHER): Payer: Medicaid Other | Admitting: Family Medicine

## 2013-12-19 ENCOUNTER — Encounter: Payer: Self-pay | Admitting: Family Medicine

## 2013-12-19 VITALS — BP 118/81 | HR 111 | Temp 98.3°F | Wt 196.0 lb

## 2013-12-19 DIAGNOSIS — Z331 Pregnant state, incidental: Secondary | ICD-10-CM

## 2013-12-19 DIAGNOSIS — Z3490 Encounter for supervision of normal pregnancy, unspecified, unspecified trimester: Secondary | ICD-10-CM

## 2013-12-19 NOTE — Patient Instructions (Signed)
Thank you for coming in, today!  In order to get your lab work and start pregnancy care, you need pregnancy Medicaid. Go to DSS and show them your letter. They will get you applied and changed over to pregnancy Medicaid. Bring that paperwork back here. The front office will help you schedule your lab appointment and initial prenatal visit.  Please feel free to call with any questions or concerns at any time, at [phone]. --Dr. Casper Harrison

## 2013-12-19 NOTE — Progress Notes (Signed)
Pt is a 23 y.o. [id number] at [redacted]w[redacted]d by reported early [place] (not yet documented in our system). Pt presented to clinic for initial OB visit, but does not yet have pregnancy Medicare. Pt reports she was diagnosed pregnant in April and went to her prior OBGYN Dr. Ambrose Mantle. Per her report, she got her [place] there but no labs or formal initial prenatal visit, and their office dismissed her because "they said they tried to contact me several times but they didn't, so I came back here."  Discussed with B. [place] and Ander Slade, RN -- pt needs pregnancy Medicaid before labs can be drawn or pt can be seen for initial OB. LOS NO CHARGE for this visit. Instructed pt to go to DSS with letter confirming pregnancy to get pregnancy Medicaid. After that, pt will return to clinic to schedule prenatal labs and reschedule initial OB visit. Pregnancy medical home form completed while the above was being determined; will keep form and then re-fill out a new one when she returns to clinic. ROI form also completed by patient to obtain records from Dr. Ambrose Mantle to see what care was done at their office.  Bobbye Morton, MD PGY-2, [facility] 12/19/2013, 2:34 PM

## 2014-01-27 LAB — OB RESULTS CONSOLE HIV ANTIBODY (ROUTINE TESTING): HIV: NONREACTIVE

## 2014-01-27 LAB — OB RESULTS CONSOLE RUBELLA ANTIBODY, IGM: Rubella: IMMUNE

## 2014-01-27 LAB — OB RESULTS CONSOLE RPR: RPR: NONREACTIVE

## 2014-01-27 LAB — OB RESULTS CONSOLE HEPATITIS B SURFACE ANTIGEN: HEP B S AG: NEGATIVE

## 2014-04-16 ENCOUNTER — Encounter (HOSPITAL_COMMUNITY): Payer: Self-pay | Admitting: *Deleted

## 2014-04-16 ENCOUNTER — Inpatient Hospital Stay (HOSPITAL_COMMUNITY)
Admission: AD | Admit: 2014-04-16 | Discharge: 2014-04-16 | Disposition: A | Discharge status: Home / Self Care | Payer: Medicaid Other | Source: Ambulatory Visit | Attending: Obstetrics and Gynecology | Admitting: Obstetrics and Gynecology

## 2014-04-16 DIAGNOSIS — N898 Other specified noninflammatory disorders of vagina: Secondary | ICD-10-CM | POA: Diagnosis not present

## 2014-04-16 DIAGNOSIS — A6000 Herpesviral infection of urogenital system, unspecified: Secondary | ICD-10-CM | POA: Insufficient documentation

## 2014-04-16 DIAGNOSIS — O9989 Other specified diseases and conditions complicating pregnancy, childbirth and the puerperium: Secondary | ICD-10-CM | POA: Diagnosis not present

## 2014-04-16 DIAGNOSIS — O0933 Supervision of pregnancy with insufficient antenatal care, third trimester: Secondary | ICD-10-CM | POA: Insufficient documentation

## 2014-04-16 DIAGNOSIS — O98312 Other infections with a predominantly sexual mode of transmission complicating pregnancy, second trimester: Secondary | ICD-10-CM | POA: Insufficient documentation

## 2014-04-16 DIAGNOSIS — Z3A30 30 weeks gestation of pregnancy: Secondary | ICD-10-CM | POA: Diagnosis not present

## 2014-04-16 DIAGNOSIS — D573 Sickle-cell trait: Secondary | ICD-10-CM | POA: Diagnosis present

## 2014-04-16 DIAGNOSIS — O0930 Supervision of pregnancy with insufficient antenatal care, unspecified trimester: Secondary | ICD-10-CM

## 2014-04-16 DIAGNOSIS — O99013 Anemia complicating pregnancy, third trimester: Secondary | ICD-10-CM | POA: Insufficient documentation

## 2014-04-16 DIAGNOSIS — N390 Urinary tract infection, site not specified: Secondary | ICD-10-CM

## 2014-04-16 DIAGNOSIS — B9620 Unspecified Escherichia coli [E. coli] as the cause of diseases classified elsewhere: Secondary | ICD-10-CM | POA: Diagnosis not present

## 2014-04-16 LAB — WET PREP, GENITAL
CLUE CELLS WET PREP: NONE SEEN
TRICH WET PREP: NONE SEEN
Yeast Wet Prep HPF POC: NONE SEEN

## 2014-04-16 NOTE — MAU Provider Note (Signed)
History   23 yo [id number] at 30 1/7 weeks presented after calling office to report thick mucusy d/c today, and when she felt inside the vagina, she "felt the baby's head".  Denies dysuria, UCs, leaking, bleeding, N/V/diarrhea, reports +FM.  Hx frequent HSV outbreaks, on Valtrex suppression, no recent or current outbreaks.  Has appt at [facility] on 04/18/14 for [place]  Patient Active Problem List   Diagnosis Date Noted  . Sickle cell trait 04/16/2014  . 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 E-coli UTI  04/16/2014  . Late prenatal care--20 weeks. 04/16/2014  . Elevated blood pressure--2008 with pregnancy 08/06/2013  . 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 Menorrhagia 01/04/2012  . OBESITY 03/03/2008  . ECZEMA, ATOPIC DERMATITIS 09/07/2006  Had elevated BP with 1st pregnancy. Elevated BP in primary care office in 2014 and early 2015 when seen in MAU, but has never been on any meds.  Chief Complaint  Patient presents with  . Vaginal Discharge  . Back Pain  . Emesis During Pregnancy   HPI:  See above  OB History   Grav Para Term Preterm Abortions TAB SAB Ect Mult Living   5 2 2  0 2 1 1  0 0 2      Past Medical History  Diagnosis Date  . Sickle cell trait   . Abnormal Pap smear   . History of chlamydia infection   . Pregnancy induced hypertension     hx  . Herpes simplex type II infection     Past Surgical History  Procedure Laterality Date  . Knee surgery      Family History  Problem Relation Age of Onset  . Diabetes Maternal Grandmother   . Hypertension Mother   . Hypertension Father   . Heart disease Maternal Grandfather     History  Substance Use Topics  . Smoking status: Never Smoker   . Smokeless tobacco: Never Used  . Alcohol Use: No    Allergies: No Known Allergies  Prescriptions prior to admission  Medication Sig Dispense Refill  . acetaminophen (TYLENOL) 325 MG tablet Take 325 mg by mouth every 6 (six) hours as needed.        ROS:  Mucusy d/c,  +FM Physical Exam   Blood pressure 129/78, pulse 97, temperature 98.4 F (36.9 C), temperature source Oral, resp. rate 16, height 5\' 5"  (1.651 m), weight 203 lb 3.2 oz (92.171 kg), last menstrual period 09/07/2013, SpO2 100.00%.  Physical Exam Chest clear Heart RRR without murmur Abd gravid, NT Pelvic--cervix posterior, external os 1 cm, internal os closed, long, firm, vtx, ballotable.  Small amount white d/c in vault.  Some redundant vaginal tissue in vault. Ext WNL  FHR Category 1 UCs none  ED Course  Assessment: IUP at 30 1/7 weeks Mucus d/c No evidence PTL  Plan: Wet prep, GC/chlamydia Anticipate d/c after results reviewed.   Nigel Bridgeman CNM, MSN 04/16/2014 6:47 PM  Addendum: Doing well, no d/c. No UCs.  Results for orders placed during the hospital encounter of 04/16/14 (from the past 24 hour(s))  WET PREP, GENITAL     Status: Abnormal   Collection Time    04/16/14  6:25 PM      Result Value Ref Range   Yeast Wet Prep HPF POC NONE SEEN  NONE SEEN   Trich, Wet Prep NONE SEEN  NONE SEEN   Clue Cells Wet Prep HPF POC NONE SEEN  NONE SEEN   WBC, Wet Prep HPF POC FEW (*) NONE  SEEN   D/C'd home with discussion of PTL. [facility].  Nigel BridgemanVicki Maks Cavallero, CNM 04/16/14 7:05p

## 2014-04-16 NOTE — MAU Note (Signed)
Patient states she has had vomiting with the pregnancy, got better, now vomiting again. Able to keep some food down. Has been having lower back pain today. States she has had two episodes of heavy clear vaginal discharge and put her fingers into her vagina and felt the baby's head, concerned about this.

## 2014-04-16 NOTE — MAU Note (Signed)
Reports good fetal movement, no bleeding.

## 2014-04-16 NOTE — Discharge Instructions (Signed)
Preterm Labor Information Preterm labor is when labor starts before you are [redacted] weeks pregnant. The normal length of pregnancy is 39 to 41 weeks.  CAUSES  The cause of preterm labor is not often known. The most common known cause is infection. RISK FACTORS  Having a history of preterm labor.  Having your water break before it should.  Having a placenta that covers the opening of the cervix.  Having a placenta that breaks away from the uterus.  Having a cervix that is too weak to hold the baby in the uterus.  Having too much fluid in the amniotic sac.  Taking drugs or smoking while pregnant.  Not gaining enough weight while pregnant.  Being younger than 18 and older than 23 years old.  Having a low income.  Being African American. SYMPTOMS  Period-like cramps, belly (abdominal) pain, or back pain.  Contractions that are regular, as often as six in an hour. They may be mild or painful.  Contractions that start at the top of the belly. They then move to the lower belly and back.  Lower belly pressure that seems to get stronger.  Bleeding from the vagina.  Fluid leaking from the vagina. TREATMENT  Treatment depends on:  Your condition.  The condition of your baby.  How many weeks pregnant you are. Your doctor may have you:  Take medicine to stop contractions.  Stay in bed except to use the restroom (bed rest).  Stay in the hospital. WHAT SHOULD YOU DO IF YOU THINK YOU ARE IN PRETERM LABOR? Call your doctor right away. You need to go to the hospital right away.  HOW CAN YOU PREVENT PRETERM LABOR IN FUTURE PREGNANCIES?  Stop smoking, if you smoke.  Maintain healthy weight gain.  Do not take drugs or be around chemicals that are not needed.  Tell your doctor if you think you have an infection.  Tell your doctor if you had a preterm labor before. Document Released: 09/23/2008 Document Revised: 04/17/2013 Document Reviewed: 09/23/2008 ExitCare Patient  Information 2015 [employer]. This information is not intended to replace advice given to you by your health care provider. Make sure you discuss any questions you have with your health care provider.  

## 2014-04-17 LAB — GC/CHLAMYDIA PROBE AMP
CT PROBE, AMP APTIMA: NEGATIVE
GC Probe RNA: NEGATIVE

## 2014-05-09 ENCOUNTER — Encounter (HOSPITAL_COMMUNITY): Payer: Self-pay

## 2014-05-09 ENCOUNTER — Inpatient Hospital Stay (HOSPITAL_COMMUNITY)
Admission: AD | Admit: 2014-05-09 | Discharge: 2014-05-09 | Disposition: A | Discharge status: Home / Self Care | Payer: Medicaid Other | Source: Ambulatory Visit | Attending: Obstetrics and Gynecology | Admitting: Obstetrics and Gynecology

## 2014-05-09 DIAGNOSIS — O26893 Other specified pregnancy related conditions, third trimester: Secondary | ICD-10-CM | POA: Insufficient documentation

## 2014-05-09 DIAGNOSIS — Z3A33 33 weeks gestation of pregnancy: Secondary | ICD-10-CM | POA: Insufficient documentation

## 2014-05-09 DIAGNOSIS — N898 Other specified noninflammatory disorders of vagina: Secondary | ICD-10-CM | POA: Insufficient documentation

## 2014-05-09 DIAGNOSIS — O0933 Supervision of pregnancy with insufficient antenatal care, third trimester: Secondary | ICD-10-CM

## 2014-05-09 HISTORY — DX: Unspecified infectious disease: B99.9

## 2014-05-09 HISTORY — DX: Headache: R51

## 2014-05-09 HISTORY — DX: Headache, unspecified: R51.9

## 2014-05-09 LAB — URINALYSIS, ROUTINE W REFLEX MICROSCOPIC
Bilirubin Urine: NEGATIVE
Glucose, UA: NEGATIVE mg/dL
HGB URINE DIPSTICK: NEGATIVE
KETONES UR: NEGATIVE mg/dL
Leukocytes, UA: NEGATIVE
Nitrite: NEGATIVE
PROTEIN: NEGATIVE mg/dL
Specific Gravity, Urine: 1.015 (ref 1.005–1.030)
UROBILINOGEN UA: 0.2 mg/dL (ref 0.0–1.0)
pH: 7 (ref 5.0–8.0)

## 2014-05-09 LAB — AMNISURE RUPTURE OF MEMBRANE (ROM) NOT AT ARMC: AMNISURE: NEGATIVE

## 2014-05-09 LAB — OB RESULTS CONSOLE GC/CHLAMYDIA
Chlamydia: NEGATIVE
Gonorrhea: NEGATIVE

## 2014-05-09 LAB — WET PREP, GENITAL
Clue Cells Wet Prep HPF POC: NONE SEEN
TRICH WET PREP: NONE SEEN
Yeast Wet Prep HPF POC: NONE SEEN

## 2014-05-09 NOTE — MAU Provider Note (Signed)
Natasha Smith is a 23 y.o. [id number] at 33.3 weeks presented to MAU c/o lof for 2 weeks. She describes it a clear watery without a odor   History     Patient Active Problem List   Diagnosis Date Noted  . Sickle cell trait 04/16/2014  . 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 E-coli UTI  04/16/2014  . Late prenatal care--20 weeks. 04/16/2014  . Elevated blood pressure--2008 with pregnancy 08/06/2013  . 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 Menorrhagia 01/04/2012  . OBESITY 03/03/2008  . ECZEMA, ATOPIC DERMATITIS 09/07/2006    Chief Complaint  Patient presents with  . Rupture of Membranes   HPI  OB History   Grav Para Term Preterm Abortions TAB SAB Ect Mult Living   5 2 2  0 2 1 1  0 0 2      Past Medical History  Diagnosis Date  . Sickle cell trait   . Abnormal Pap smear   . History of chlamydia infection   . Pregnancy induced hypertension     hx  . Herpes simplex type II infection   . Headache   . Infection     UTI    Past Surgical History  Procedure Laterality Date  . Knee surgery      Family History  Problem Relation Age of Onset  . Diabetes Maternal Grandmother   . Hypertension Mother   . Asthma Mother   . Heart disease Maternal Grandfather   . Asthma Sister   . Asthma Brother   . Cancer Maternal Aunt     History  Substance Use Topics  . Smoking status: Former Games developermoker  . Smokeless tobacco: Never Used     Comment: March 2015  . Alcohol Use: No    Allergies: No Known Allergies  Prescriptions prior to admission  Medication Sig Dispense Refill  . acetaminophen (TYLENOL) 325 MG tablet Take 325 mg by mouth every 6 (six) hours as needed.        ROS See HPI above, all other systems are negative  Physical Exam   Blood pressure 124/71, pulse 95, temperature 98.6 F (37 C), temperature source Oral, resp. rate 16, height 5\' 5"  (1.651 m), weight 94.348 kg (208 lb), last menstrual period 09/07/2013, SpO2 98.00%.  Physical Exam Ext:  WNL ABD:  Soft, non tender to palpation, no rebound or guarding SVE: C/T/H   ED Course  Assessment: IUP at  33.3weeks Membranes: unsure FHR: reassuring and appropriate for a 33 weeker CTX: none   Plan: Wetprep, GC/CL, [employer]    Naraly Fritcher, CNM, MSN 05/09/2014. 5:01 PM

## 2014-05-09 NOTE — MAU Note (Signed)
Spots of wetness, no gush or trickle.  Mild cramps in back today.

## 2014-05-09 NOTE — MAU Provider Note (Signed)
MAU Addendum Note  Results for orders placed during the hospital encounter of 05/09/14 (from the past 24 hour(s))  URINALYSIS, ROUTINE W REFLEX MICROSCOPIC     Status: Abnormal   Collection Time    05/09/14  4:45 PM      Result Value Ref Range   Color, Urine YELLOW  YELLOW   APPearance HAZY (*) CLEAR   Specific Gravity, Urine 1.015  1.005 - 1.030   pH 7.0  5.0 - 8.0   Glucose, UA NEGATIVE  NEGATIVE mg/dL   Hgb urine dipstick NEGATIVE  NEGATIVE   Bilirubin Urine NEGATIVE  NEGATIVE   Ketones, ur NEGATIVE  NEGATIVE mg/dL   Protein, ur NEGATIVE  NEGATIVE mg/dL   Urobilinogen, UA 0.2  0.0 - 1.0 mg/dL   Nitrite NEGATIVE  NEGATIVE   Leukocytes, UA NEGATIVE  NEGATIVE  WET PREP, GENITAL     Status: Abnormal   Collection Time    05/09/14  5:15 PM      Result Value Ref Range   Yeast Wet Prep HPF POC NONE SEEN  NONE SEEN   Trich, Wet Prep NONE SEEN  NONE SEEN   Clue Cells Wet Prep HPF POC NONE SEEN  NONE SEEN   WBC, Wet Prep HPF POC FEW (*) NONE SEEN  AMNISURE RUPTURE OF MEMBRANE (ROM)     Status: None   Collection Time    05/09/14  5:15 PM      Result Value Ref Range   Amnisure ROM NEGATIVE       DC to home with PTl precaution FU in the office at next schedule ROB   Lesslie Mossa, CNM, MSN 05/09/2014. 5:52 PM

## 2014-05-09 NOTE — Discharge Instructions (Signed)
Fetal Movement Counts °Patient Name: __________________________________________________ Patient Due Date: ____________________ °Performing a fetal movement count is highly recommended in high-risk pregnancies, but it is good for every pregnant woman to do. Your health care provider may ask you to start counting fetal movements at 28 weeks of the pregnancy. Fetal movements often increase: °· After eating a full meal. °· After physical activity. °· After eating or drinking something sweet or cold. °· At rest. °Pay attention to when you feel the baby is most active. This will help you notice a pattern of your baby's sleep and wake cycles and what factors contribute to an increase in fetal movement. It is important to perform a fetal movement count at the same time each day when your baby is normally most active.  °HOW TO COUNT FETAL MOVEMENTS °1. Find a quiet and comfortable area to sit or lie down on your left side. Lying on your left side provides the best blood and oxygen circulation to your baby. °2. Write down the day and time on a sheet of paper or in a journal. °3. Start counting kicks, flutters, swishes, rolls, or jabs in a 2-hour period. You should feel at least 10 movements within 2 hours. °4. If you do not feel 10 movements in 2 hours, wait 2-3 hours and count again. Look for a change in the pattern or not enough counts in 2 hours. °SEEK MEDICAL CARE IF: °· You feel less than 10 counts in 2 hours, tried twice. °· There is no movement in over an hour. °· The pattern is changing or taking longer each day to reach 10 counts in 2 hours. °· You feel the baby is not moving as he or she usually does. °Date: ____________ Movements: ____________ Start time: ____________ Finish time: ____________  °Date: ____________ Movements: ____________ Start time: ____________ Finish time: ____________ °Date: ____________ Movements: ____________ Start time: ____________ Finish time: ____________ °Date: ____________ Movements:  ____________ Start time: ____________ Finish time: ____________ °Date: ____________ Movements: ____________ Start time: ____________ Finish time: ____________ °Date: ____________ Movements: ____________ Start time: ____________ Finish time: ____________ °Date: ____________ Movements: ____________ Start time: ____________ Finish time: ____________ °Date: ____________ Movements: ____________ Start time: ____________ Finish time: ____________  °Date: ____________ Movements: ____________ Start time: ____________ Finish time: ____________ °Date: ____________ Movements: ____________ Start time: ____________ Finish time: ____________ °Date: ____________ Movements: ____________ Start time: ____________ Finish time: ____________ °Date: ____________ Movements: ____________ Start time: ____________ Finish time: ____________ °Date: ____________ Movements: ____________ Start time: ____________ Finish time: ____________ °Date: ____________ Movements: ____________ Start time: ____________ Finish time: ____________ °Date: ____________ Movements: ____________ Start time: ____________ Finish time: ____________  °Date: ____________ Movements: ____________ Start time: ____________ Finish time: ____________ °Date: ____________ Movements: ____________ Start time: ____________ Finish time: ____________ °Date: ____________ Movements: ____________ Start time: ____________ Finish time: ____________ °Date: ____________ Movements: ____________ Start time: ____________ Finish time: ____________ °Date: ____________ Movements: ____________ Start time: ____________ Finish time: ____________ °Date: ____________ Movements: ____________ Start time: ____________ Finish time: ____________ °Date: ____________ Movements: ____________ Start time: ____________ Finish time: ____________  °Date: ____________ Movements: ____________ Start time: ____________ Finish time: ____________ °Date: ____________ Movements: ____________ Start time: ____________ Finish  time: ____________ °Date: ____________ Movements: ____________ Start time: ____________ Finish time: ____________ °Date: ____________ Movements: ____________ Start time: ____________ Finish time: ____________ °Date: ____________ Movements: ____________ Start time: ____________ Finish time: ____________ °Date: ____________ Movements: ____________ Start time: ____________ Finish time: ____________ °Date: ____________ Movements: ____________ Start time: ____________ Finish time: ____________  °Date: ____________ Movements: ____________ Start time: ____________ Finish   time: ____________ °Date: ____________ Movements: ____________ Start time: ____________ Finish time: ____________ °Date: ____________ Movements: ____________ Start time: ____________ Finish time: ____________ °Date: ____________ Movements: ____________ Start time: ____________ Finish time: ____________ °Date: ____________ Movements: ____________ Start time: ____________ Finish time: ____________ °Date: ____________ Movements: ____________ Start time: ____________ Finish time: ____________ °Date: ____________ Movements: ____________ Start time: ____________ Finish time: ____________  °Date: ____________ Movements: ____________ Start time: ____________ Finish time: ____________ °Date: ____________ Movements: ____________ Start time: ____________ Finish time: ____________ °Date: ____________ Movements: ____________ Start time: ____________ Finish time: ____________ °Date: ____________ Movements: ____________ Start time: ____________ Finish time: ____________ °Date: ____________ Movements: ____________ Start time: ____________ Finish time: ____________ °Date: ____________ Movements: ____________ Start time: ____________ Finish time: ____________ °Date: ____________ Movements: ____________ Start time: ____________ Finish time: ____________  °Date: ____________ Movements: ____________ Start time: ____________ Finish time: ____________ °Date: ____________  Movements: ____________ Start time: ____________ Finish time: ____________ °Date: ____________ Movements: ____________ Start time: ____________ Finish time: ____________ °Date: ____________ Movements: ____________ Start time: ____________ Finish time: ____________ °Date: ____________ Movements: ____________ Start time: ____________ Finish time: ____________ °Date: ____________ Movements: ____________ Start time: ____________ Finish time: ____________ °Date: ____________ Movements: ____________ Start time: ____________ Finish time: ____________  °Date: ____________ Movements: ____________ Start time: ____________ Finish time: ____________ °Date: ____________ Movements: ____________ Start time: ____________ Finish time: ____________ °Date: ____________ Movements: ____________ Start time: ____________ Finish time: ____________ °Date: ____________ Movements: ____________ Start time: ____________ Finish time: ____________ °Date: ____________ Movements: ____________ Start time: ____________ Finish time: ____________ °Date: ____________ Movements: ____________ Start time: ____________ Finish time: ____________ °Document Released: 07/27/2006 Document Revised: 11/11/2013 Document Reviewed: 04/23/2012 °ExitCare® Patient Information ©2015 [employer]. This information is not intended to replace advice given to you by your health care provider. Make sure you discuss any questions you have with your health care provider. ° °Preterm Labor Information °Preterm labor is when labor starts at less than 37 weeks of pregnancy. The normal length of a pregnancy is 39 to 41 weeks. °CAUSES °Often, there is no identifiable underlying cause as to why a woman goes into preterm labor. One of the most common known causes of preterm labor is infection. Infections of the uterus, cervix, vagina, amniotic sac, bladder, kidney, or even the lungs (pneumonia) can cause labor to start. Other suspected causes of preterm labor include:  °· Urogenital  infections, such as yeast infections and bacterial vaginosis.   °· Uterine abnormalities (uterine shape, uterine septum, fibroids, or bleeding from the placenta).   °· A cervix that has been operated on (it may fail to stay closed).   °· Malformations in the fetus.   °· Multiple gestations (twins, triplets, and so on).   °· Breakage of the amniotic sac.   °RISK FACTORS °· Having a previous history of preterm labor.   °· Having premature rupture of membranes (PROM).   °· Having a placenta that covers the opening of the cervix (placenta previa).   °· Having a placenta that separates from the uterus (placental abruption).   °· Having a cervix that is too weak to hold the fetus in the uterus (incompetent cervix).   °· Having too much fluid in the amniotic sac (polyhydramnios).   °· Taking illegal drugs or smoking while pregnant.   °· Not gaining enough weight while pregnant.   °· Being younger than 18 and older than 23 years old.   °· Having a low socioeconomic status.   °· Being African American. °SYMPTOMS °Signs and symptoms of preterm labor include:  °· Menstrual-like cramps, abdominal pain, or back pain. °· Uterine contractions that are regular, as frequent as six in   an hour, regardless of their intensity (may be mild or painful). °· Contractions that start on the top of the uterus and spread down to the lower abdomen and back.   °· A sense of increased pelvic pressure.   °· A watery or bloody mucus discharge that comes from the vagina.   °TREATMENT °Depending on the length of the pregnancy and other circumstances, your health care provider may suggest bed rest. If necessary, there are medicines that can be given to stop contractions and to mature the fetal lungs. If labor happens before 34 weeks of pregnancy, a prolonged hospital stay may be recommended. Treatment depends on the condition of both you and the fetus.  °WHAT SHOULD YOU DO IF YOU THINK YOU ARE IN PRETERM LABOR? °Call your health care provider right  away. You will need to go to the hospital to get checked immediately. °HOW CAN YOU PREVENT PRETERM LABOR IN FUTURE PREGNANCIES? °You should:  °· Stop smoking if you smoke.  °· Maintain healthy weight gain and avoid chemicals and drugs that are not necessary. °· Be watchful for any type of infection. °· Inform your health care provider if you have a known history of preterm labor. °Document Released: 09/17/2003 Document Revised: 02/27/2013 Document Reviewed: 07/30/2012 °ExitCare® Patient Information ©2015 [employer]. This information is not intended to replace advice given to you by your health care provider. Make sure you discuss any questions you have with your health care provider. ° °

## 2014-05-09 NOTE — MAU Note (Signed)
Patient states when she woke up this am had some wet spots in the bed, happened again on the sofa. States she has continued to leak off and on, not wearing a pad in triage. Having some contractions. Reports fetal movement, not as much as usual.

## 2014-05-10 LAB — GC/CHLAMYDIA PROBE AMP
CT Probe RNA: NEGATIVE
GC Probe RNA: NEGATIVE

## 2014-05-12 ENCOUNTER — Encounter (HOSPITAL_COMMUNITY): Payer: Self-pay

## 2014-05-22 LAB — OB RESULTS CONSOLE GBS: GBS: NEGATIVE

## 2014-06-03 ENCOUNTER — Inpatient Hospital Stay (HOSPITAL_COMMUNITY)
Admission: AD | Admit: 2014-06-03 | Discharge: 2014-06-03 | Disposition: A | Discharge status: Home / Self Care | Payer: Medicaid Other | Source: Ambulatory Visit | Attending: Obstetrics and Gynecology | Admitting: Obstetrics and Gynecology

## 2014-06-03 ENCOUNTER — Encounter (HOSPITAL_COMMUNITY): Payer: Self-pay | Admitting: General Practice

## 2014-06-03 ENCOUNTER — Inpatient Hospital Stay (HOSPITAL_COMMUNITY): Payer: Medicaid Other

## 2014-06-03 DIAGNOSIS — Z3A37 37 weeks gestation of pregnancy: Secondary | ICD-10-CM

## 2014-06-03 DIAGNOSIS — Z3483 Encounter for supervision of other normal pregnancy, third trimester: Secondary | ICD-10-CM | POA: Diagnosis not present

## 2014-06-03 DIAGNOSIS — Z87891 Personal history of nicotine dependence: Secondary | ICD-10-CM | POA: Insufficient documentation

## 2014-06-03 DIAGNOSIS — O368190 Decreased fetal movements, unspecified trimester, not applicable or unspecified: Secondary | ICD-10-CM

## 2014-06-03 DIAGNOSIS — O368131 Decreased fetal movements, third trimester, fetus 1: Secondary | ICD-10-CM

## 2014-06-03 LAB — COMPREHENSIVE METABOLIC PANEL
ALK PHOS: 134 U/L — AB (ref 39–117)
ALT: 6 U/L (ref 0–35)
ANION GAP: 12 (ref 5–15)
AST: 10 U/L (ref 0–37)
Albumin: 2.9 g/dL — ABNORMAL LOW (ref 3.5–5.2)
BUN: 5 mg/dL — AB (ref 6–23)
CHLORIDE: 102 meq/L (ref 96–112)
CO2: 21 mEq/L (ref 19–32)
Calcium: 9.2 mg/dL (ref 8.4–10.5)
Creatinine, Ser: 0.63 mg/dL (ref 0.50–1.10)
GFR calc non Af Amer: >90 mL/min (ref >90)
GLUCOSE: 85 mg/dL (ref 70–99)
Potassium: 3.9 mEq/L (ref 3.7–5.3)
Sodium: 135 mEq/L — ABNORMAL LOW (ref 137–147)
TOTAL PROTEIN: 6.6 g/dL (ref 6.0–8.3)
Total Bilirubin: 0.5 mg/dL (ref 0.3–1.2)

## 2014-06-03 LAB — CBC WITH DIFFERENTIAL/PLATELET
Basophils Absolute: 0 10*3/uL (ref 0.0–0.1)
Basophils Relative: 0 % (ref 0–1)
EOS ABS: 0.1 10*3/uL (ref 0.0–0.7)
EOS PCT: 1 % (ref 0–5)
HCT: 34.6 % — ABNORMAL LOW (ref 36.0–46.0)
Hemoglobin: 11.4 g/dL — ABNORMAL LOW (ref 12.0–15.0)
LYMPHS PCT: 14 % (ref 12–46)
Lymphs Abs: 2 10*3/uL (ref 0.7–4.0)
MCH: 23.9 pg — ABNORMAL LOW (ref 26.0–34.0)
MCHC: 32.9 g/dL (ref 30.0–36.0)
MCV: 72.5 fL — AB (ref 78.0–100.0)
Monocytes Absolute: 0.9 10*3/uL (ref 0.1–1.0)
Monocytes Relative: 7 % (ref 3–12)
Neutro Abs: 10.8 10*3/uL — ABNORMAL HIGH (ref 1.7–7.7)
Neutrophils Relative %: 78 % — ABNORMAL HIGH (ref 43–77)
PLATELETS: 170 10*3/uL (ref 150–400)
RBC: 4.77 MIL/uL (ref 3.87–5.11)
RDW: 15.8 % — ABNORMAL HIGH (ref 11.5–15.5)
WBC: 13.8 10*3/uL — AB (ref 4.0–10.5)

## 2014-06-03 LAB — URINALYSIS, ROUTINE W REFLEX MICROSCOPIC
Bilirubin Urine: NEGATIVE
Glucose, UA: NEGATIVE mg/dL
HGB URINE DIPSTICK: NEGATIVE
Ketones, ur: NEGATIVE mg/dL
Nitrite: NEGATIVE
PROTEIN: NEGATIVE mg/dL
Specific Gravity, Urine: 1.015 (ref 1.005–1.030)
UROBILINOGEN UA: 0.2 mg/dL (ref 0.0–1.0)
pH: 6 (ref 5.0–8.0)

## 2014-06-03 LAB — URINE MICROSCOPIC-ADD ON

## 2014-06-03 LAB — LACTATE DEHYDROGENASE: LDH: 154 U/L (ref 94–250)

## 2014-06-03 LAB — PROTEIN / CREATININE RATIO, URINE
CREATININE, URINE: 92.03 mg/dL
Protein Creatinine Ratio: 0.07 (ref 0.00–0.15)
Total Protein, Urine: 6.9 mg/dL

## 2014-06-03 LAB — URIC ACID: URIC ACID, SERUM: 3.3 mg/dL (ref 2.4–7.0)

## 2014-06-03 MED ORDER — LACTATED RINGERS IV BOLUS (SEPSIS)
1000.0000 mL | Freq: Once | INTRAVENOUS | Status: AC
  Administered 2014-06-03: 1000 mL via INTRAVENOUS

## 2014-06-03 MED ORDER — ONDANSETRON HCL 4 MG/2ML IJ SOLN
4.0000 mg | Freq: Once | INTRAMUSCULAR | Status: AC
  Administered 2014-06-03: 4 mg via INTRAVENOUS
  Filled 2014-06-03: qty 2

## 2014-06-03 NOTE — MAU Provider Note (Signed)
Natasha Smith is a 23 y.o. [phone] at 37.0 weeks present to MAU from the office c/o light headedness, dizziness, n/v and decrease fetal movement.  Orthostatics in the office was wnl, BP was elevated at 140's/100's. Pt reports loosing her mucus plug 2 days ago.  She denies vb or lof.   History     Patient Active Problem List   Diagnosis Date Noted  . Sickle cell trait 04/16/2014  . 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 E-coli UTI  04/16/2014  . Late prenatal care--20 weeks. 04/16/2014  . Elevated blood pressure--2008 with pregnancy 08/06/2013  . 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 Menorrhagia 01/04/2012  . OBESITY 03/03/2008  . ECZEMA, ATOPIC DERMATITIS 09/07/2006    No chief complaint on file.  HPI  OB History    Gravida Para Term Preterm AB TAB SAB Ectopic Multiple Living   5 2 2  0 2 1 1  0 0 2      Past Medical History  Diagnosis Date  . Sickle cell trait   . Abnormal Pap smear   . History of chlamydia infection   . Pregnancy induced hypertension     hx  . Herpes simplex type II infection   . Headache   . Infection     UTI    Past Surgical History  Procedure Laterality Date  . Knee surgery      Family History  Problem Relation Age of Onset  . Diabetes Maternal Grandmother   . Hypertension Mother   . Asthma Mother   . Heart disease Maternal Grandfather   . Asthma Sister   . Asthma Brother   . Cancer Maternal Aunt     History  Substance Use Topics  . Smoking status: Former Games developermoker  . Smokeless tobacco: Never Used     Comment: March 2015  . Alcohol Use: No    Allergies: No Known Allergies  Prescriptions prior to admission  Medication Sig Dispense Refill Last Dose  . acetaminophen (TYLENOL) 325 MG tablet Take 325 mg by mouth every 6 (six) hours as needed.   Past Month at Unknown time    ROS See HPI above, all other systems are negative  Physical Exam   Last menstrual period 09/07/2013.  Physical Exam Ext:  WNL ABD: Soft, non tender  to palpation, no rebound or guarding SVE: deferred   ED Course  Assessment: IUP at  37.0 weeks Membranes:Intact FHR: Category 1, FHR 135, + accel, no decels, moderate variabilty CTX:  none   Plan: IV bolus Labs: CBC, PIH labs NST US - [facility] .   Akilah Cureton, CNM, MSN 06/03/2014. 10:27 AM

## 2014-06-03 NOTE — MAU Note (Signed)
Patient states she was in the office today. Has been feeling lightheaded and dizzy. Reports less fetal movement.

## 2014-06-03 NOTE — Discharge Instructions (Signed)
Fetal Movement Counts °Patient Name: __________________________________________________ Patient Due Date: ____________________ °Performing a fetal movement count is highly recommended in high-risk pregnancies, but it is good for every pregnant woman to do. Your health care provider may ask you to start counting fetal movements at 28 weeks of the pregnancy. Fetal movements often increase: °· After eating a full meal. °· After physical activity. °· After eating or drinking something sweet or cold. °· At rest. °Pay attention to when you feel the baby is most active. This will help you notice a pattern of your baby's sleep and wake cycles and what factors contribute to an increase in fetal movement. It is important to perform a fetal movement count at the same time each day when your baby is normally most active.  °HOW TO COUNT FETAL MOVEMENTS °1. Find a quiet and comfortable area to sit or lie down on your left side. Lying on your left side provides the best blood and oxygen circulation to your baby. °2. Write down the day and time on a sheet of paper or in a journal. °3. Start counting kicks, flutters, swishes, rolls, or jabs in a 2-hour period. You should feel at least 10 movements within 2 hours. °4. If you do not feel 10 movements in 2 hours, wait 2-3 hours and count again. Look for a change in the pattern or not enough counts in 2 hours. °SEEK MEDICAL CARE IF: °· You feel less than 10 counts in 2 hours, tried twice. °· There is no movement in over an hour. °· The pattern is changing or taking longer each day to reach 10 counts in 2 hours. °· You feel the baby is not moving as he or she usually does. °Date: ____________ Movements: ____________ Start time: ____________ Finish time: ____________  °Date: ____________ Movements: ____________ Start time: ____________ Finish time: ____________ °Date: ____________ Movements: ____________ Start time: ____________ Finish time: ____________ °Date: ____________ Movements:  ____________ Start time: ____________ Finish time: ____________ °Date: ____________ Movements: ____________ Start time: ____________ Finish time: ____________ °Date: ____________ Movements: ____________ Start time: ____________ Finish time: ____________ °Date: ____________ Movements: ____________ Start time: ____________ Finish time: ____________ °Date: ____________ Movements: ____________ Start time: ____________ Finish time: ____________  °Date: ____________ Movements: ____________ Start time: ____________ Finish time: ____________ °Date: ____________ Movements: ____________ Start time: ____________ Finish time: ____________ °Date: ____________ Movements: ____________ Start time: ____________ Finish time: ____________ °Date: ____________ Movements: ____________ Start time: ____________ Finish time: ____________ °Date: ____________ Movements: ____________ Start time: ____________ Finish time: ____________ °Date: ____________ Movements: ____________ Start time: ____________ Finish time: ____________ °Date: ____________ Movements: ____________ Start time: ____________ Finish time: ____________  °Date: ____________ Movements: ____________ Start time: ____________ Finish time: ____________ °Date: ____________ Movements: ____________ Start time: ____________ Finish time: ____________ °Date: ____________ Movements: ____________ Start time: ____________ Finish time: ____________ °Date: ____________ Movements: ____________ Start time: ____________ Finish time: ____________ °Date: ____________ Movements: ____________ Start time: ____________ Finish time: ____________ °Date: ____________ Movements: ____________ Start time: ____________ Finish time: ____________ °Date: ____________ Movements: ____________ Start time: ____________ Finish time: ____________  °Date: ____________ Movements: ____________ Start time: ____________ Finish time: ____________ °Date: ____________ Movements: ____________ Start time: ____________ Finish  time: ____________ °Date: ____________ Movements: ____________ Start time: ____________ Finish time: ____________ °Date: ____________ Movements: ____________ Start time: ____________ Finish time: ____________ °Date: ____________ Movements: ____________ Start time: ____________ Finish time: ____________ °Date: ____________ Movements: ____________ Start time: ____________ Finish time: ____________ °Date: ____________ Movements: ____________ Start time: ____________ Finish time: ____________  °Date: ____________ Movements: ____________ Start time: ____________ Finish   time: ____________ °Date: ____________ Movements: ____________ Start time: ____________ Finish time: ____________ °Date: ____________ Movements: ____________ Start time: ____________ Finish time: ____________ °Date: ____________ Movements: ____________ Start time: ____________ Finish time: ____________ °Date: ____________ Movements: ____________ Start time: ____________ Finish time: ____________ °Date: ____________ Movements: ____________ Start time: ____________ Finish time: ____________ °Date: ____________ Movements: ____________ Start time: ____________ Finish time: ____________  °Date: ____________ Movements: ____________ Start time: ____________ Finish time: ____________ °Date: ____________ Movements: ____________ Start time: ____________ Finish time: ____________ °Date: ____________ Movements: ____________ Start time: ____________ Finish time: ____________ °Date: ____________ Movements: ____________ Start time: ____________ Finish time: ____________ °Date: ____________ Movements: ____________ Start time: ____________ Finish time: ____________ °Date: ____________ Movements: ____________ Start time: ____________ Finish time: ____________ °Date: ____________ Movements: ____________ Start time: ____________ Finish time: ____________  °Date: ____________ Movements: ____________ Start time: ____________ Finish time: ____________ °Date: ____________  Movements: ____________ Start time: ____________ Finish time: ____________ °Date: ____________ Movements: ____________ Start time: ____________ Finish time: ____________ °Date: ____________ Movements: ____________ Start time: ____________ Finish time: ____________ °Date: ____________ Movements: ____________ Start time: ____________ Finish time: ____________ °Date: ____________ Movements: ____________ Start time: ____________ Finish time: ____________ °Date: ____________ Movements: ____________ Start time: ____________ Finish time: ____________  °Date: ____________ Movements: ____________ Start time: ____________ Finish time: ____________ °Date: ____________ Movements: ____________ Start time: ____________ Finish time: ____________ °Date: ____________ Movements: ____________ Start time: ____________ Finish time: ____________ °Date: ____________ Movements: ____________ Start time: ____________ Finish time: ____________ °Date: ____________ Movements: ____________ Start time: ____________ Finish time: ____________ °Date: ____________ Movements: ____________ Start time: ____________ Finish time: ____________ °Document Released: 07/27/2006 Document Revised: 11/11/2013 Document Reviewed: 04/23/2012 °[employer]® Patient Information ©2015 [employer]. This information is not intended to replace advice given to you by your health care provider. Make sure you discuss any questions you have with your health care provider. °Braxton Hicks Contractions °Contractions of the uterus can occur throughout pregnancy. Contractions are not always a sign that you are in labor.  °WHAT ARE BRAXTON HICKS CONTRACTIONS?  °Contractions that occur before labor are called Braxton Hicks contractions, or false labor. Toward the end of pregnancy (32-34 weeks), these contractions can develop more often and may become more forceful. This is not true labor because these contractions do not result in opening (dilatation) and thinning of the cervix. They  are sometimes difficult to tell apart from true labor because these contractions can be forceful and people have different pain tolerances. You should not feel embarrassed if you go to the hospital with false labor. Sometimes, the only way to tell if you are in true labor is for your health care provider to look for changes in the cervix. °If there are no prenatal problems or other health problems associated with the pregnancy, it is completely safe to be sent home with false labor and await the onset of true labor. °HOW CAN YOU TELL THE DIFFERENCE BETWEEN TRUE AND FALSE LABOR? °False Labor °· The contractions of false labor are usually shorter and not as hard as those of true labor.   °· The contractions are usually irregular.   °· The contractions are often felt in the front of the lower abdomen and in the groin.   °· The contractions may go away when you walk around or change positions while lying down.   °· The contractions get weaker and are shorter lasting as time goes on.   °· The contractions do not usually become progressively stronger, regular, and closer together as with true labor.   °True Labor °5. Contractions in true labor last 30-70 seconds, become   very regular, usually become more intense, and increase in frequency.   °6. The contractions do not go away with walking.   °7. The discomfort is usually felt in the top of the uterus and spreads to the lower abdomen and low back.   °8. True labor can be determined by your health care provider with an exam. This will show that the cervix is dilating and getting thinner.   °WHAT TO REMEMBER °· Keep up with your usual exercises and follow other instructions given by your health care provider.   °· Take medicines as directed by your health care provider.   °· Keep your regular prenatal appointments.   °· Eat and drink lightly if you think you are going into labor.   °· If Braxton Hicks contractions are making you uncomfortable:   °· Change your position from  lying down or resting to walking, or from walking to resting.   °· Sit and rest in a tub of warm water.   °· Drink 2-3 glasses of water. Dehydration may cause these contractions.   °· Do slow and deep breathing several times an hour.   °WHEN SHOULD I SEEK IMMEDIATE MEDICAL CARE? °Seek immediate medical care if: °· Your contractions become stronger, more regular, and closer together.   °· You have fluid leaking or gushing from your vagina.   °· You have a fever.   °· You pass blood-tinged mucus.   °· You have vaginal bleeding.   °· You have continuous abdominal pain.   °· You have low back pain that you never had before.   °· You feel your baby's head pushing down and causing pelvic pressure.   °· Your baby is not moving as much as it used to.   °Document Released: 06/27/2005 Document Revised: 07/02/2013 Document Reviewed: 04/08/2013 °[employer]® Patient Information ©2015 [employer]. This information is not intended to replace advice given to you by your health care provider. Make sure you discuss any questions you have with your health care provider. ° °

## 2014-06-03 NOTE — MAU Provider Note (Signed)
MAU Addendum Note Pt reports feeling better   Results for orders placed or performed during the hospital encounter of 06/03/14 (from the past 24 hour(s))  Urinalysis, Routine w reflex microscopic     Status: Abnormal   Collection Time: 06/03/14 10:24 AM  Result Value Ref Range   Color, Urine YELLOW YELLOW   APPearance CLEAR CLEAR   Specific Gravity, Urine 1.015 1.005 - 1.030   pH 6.0 5.0 - 8.0   Glucose, UA NEGATIVE NEGATIVE mg/dL   Hgb urine dipstick NEGATIVE NEGATIVE   Bilirubin Urine NEGATIVE NEGATIVE   Ketones, ur NEGATIVE NEGATIVE mg/dL   Protein, ur NEGATIVE NEGATIVE mg/dL   Urobilinogen, UA 0.2 0.0 - 1.0 mg/dL   Nitrite NEGATIVE NEGATIVE   Leukocytes, UA TRACE (A) NEGATIVE  Protein / creatinine ratio, urine     Status: None   Collection Time: 06/03/14 10:24 AM  Result Value Ref Range   Creatinine, Urine 92.03 mg/dL   Total Protein, Urine 6.9 mg/dL   Protein Creatinine Ratio 0.07 0.00 - 0.15  Urine microscopic-add on     Status: Abnormal   Collection Time: 06/03/14 10:24 AM  Result Value Ref Range   Squamous Epithelial / LPF MANY (A) RARE   WBC, UA 3-6 <3 WBC/hpf   Bacteria, UA RARE RARE   Urine-Other MUCOUS PRESENT   CBC with Differential     Status: Abnormal   Collection Time: 06/03/14 10:32 AM  Result Value Ref Range   WBC 13.8 (H) 4.0 - 10.5 K/uL   RBC 4.77 3.87 - 5.11 MIL/uL   Hemoglobin 11.4 (L) 12.0 - 15.0 g/dL   HCT 82.934.6 (L) 56.236.0 - 13.046.0 %   MCV 72.5 (L) 78.0 - 100.0 fL   MCH 23.9 (L) 26.0 - 34.0 pg   MCHC 32.9 30.0 - 36.0 g/dL   RDW 86.515.8 (H) 78.411.5 - 69.615.5 %   Platelets 170 150 - 400 K/uL   Neutrophils Relative % 78 (H) 43 - 77 %   Neutro Abs 10.8 (H) 1.7 - 7.7 K/uL   Lymphocytes Relative 14 12 - 46 %   Lymphs Abs 2.0 0.7 - 4.0 K/uL   Monocytes Relative 7 3 - 12 %   Monocytes Absolute 0.9 0.1 - 1.0 K/uL   Eosinophils Relative 1 0 - 5 %   Eosinophils Absolute 0.1 0.0 - 0.7 K/uL   Basophils Relative 0 0 - 1 %   Basophils Absolute 0.0 0.0 - 0.1 K/uL   Uric acid     Status: None   Collection Time: 06/03/14 10:32 AM  Result Value Ref Range   Uric Acid, Serum 3.3 2.4 - 7.0 mg/dL  Comprehensive metabolic panel     Status: Abnormal   Collection Time: 06/03/14 10:32 AM  Result Value Ref Range   Sodium 135 (L) 137 - 147 mEq/L   Potassium 3.9 3.7 - 5.3 mEq/L   Chloride 102 96 - 112 mEq/L   CO2 21 19 - 32 mEq/L   Glucose, Bld 85 70 - 99 mg/dL   BUN 5 (L) 6 - 23 mg/dL   Creatinine, Ser 2.950.63 0.50 - 1.10 mg/dL   Calcium 9.2 8.4 - 28.410.5 mg/dL   Total Protein 6.6 6.0 - 8.3 g/dL   Albumin 2.9 (L) 3.5 - 5.2 g/dL   AST 10 0 - 37 U/L   ALT 6 0 - 35 U/L   Alkaline Phosphatase 134 (H) 39 - 117 U/L   Total Bilirubin 0.5 0.3 - 1.2 mg/dL   GFR calc  non Af Amer >90 >90 mL/min   GFR calc Af Amer >90 >90 mL/min   Anion gap 12 5 - 15  Lactate dehydrogenase     Status: None   Collection Time: 06/03/14 10:32 AM  Result Value Ref Range   LDH 154 94 - 250 U/L   Filed Vitals:   06/03/14 1259 06/03/14 1300 06/03/14 1315 06/03/14 1330  BP: 126/78 122/82 101/58 124/85  Pulse: 87 86 83 82  Temp:      TempSrc:      Resp:      Height:      Weight:       BPP 8/8 NST reactive, FHR 125 + accel, no decel, moderate variability, some irritability noted   Plan: DC instable condition Instructions to FU in the office for schedule ROB Bleeding and Labor precautions Kick count instructions   Natasha Smith, CNM, MSN 06/03/2014. 12:09 PM

## 2014-06-12 ENCOUNTER — Inpatient Hospital Stay (HOSPITAL_COMMUNITY): Payer: Medicaid Other | Admitting: Anesthesiology

## 2014-06-12 ENCOUNTER — Encounter: Payer: Self-pay | Admitting: Obstetrics and Gynecology

## 2014-06-12 ENCOUNTER — Encounter (HOSPITAL_COMMUNITY): Payer: Self-pay | Admitting: *Deleted

## 2014-06-12 ENCOUNTER — Inpatient Hospital Stay (HOSPITAL_COMMUNITY)
Admission: AD | Admit: 2014-06-12 | Discharge: 2014-06-14 | DRG: 774 | Disposition: A | Discharge status: Home / Self Care | Payer: Medicaid Other | Source: Ambulatory Visit | Attending: Obstetrics and Gynecology | Admitting: Obstetrics and Gynecology

## 2014-06-12 DIAGNOSIS — O09299 Supervision of pregnancy with other poor reproductive or obstetric history, unspecified trimester: Secondary | ICD-10-CM | POA: Insufficient documentation

## 2014-06-12 DIAGNOSIS — Z87891 Personal history of nicotine dependence: Secondary | ICD-10-CM

## 2014-06-12 DIAGNOSIS — O9832 Other infections with a predominantly sexual mode of transmission complicating childbirth: Secondary | ICD-10-CM | POA: Diagnosis present

## 2014-06-12 DIAGNOSIS — A6000 Herpesviral infection of urogenital system, unspecified: Secondary | ICD-10-CM | POA: Diagnosis present

## 2014-06-12 DIAGNOSIS — Z833 Family history of diabetes mellitus: Secondary | ICD-10-CM

## 2014-06-12 DIAGNOSIS — Z8249 Family history of ischemic heart disease and other diseases of the circulatory system: Secondary | ICD-10-CM

## 2014-06-12 DIAGNOSIS — O9902 Anemia complicating childbirth: Secondary | ICD-10-CM | POA: Diagnosis present

## 2014-06-12 DIAGNOSIS — Z825 Family history of asthma and other chronic lower respiratory diseases: Secondary | ICD-10-CM

## 2014-06-12 DIAGNOSIS — D573 Sickle-cell trait: Secondary | ICD-10-CM | POA: Diagnosis present

## 2014-06-12 DIAGNOSIS — Z3A38 38 weeks gestation of pregnancy: Secondary | ICD-10-CM | POA: Diagnosis present

## 2014-06-12 DIAGNOSIS — Z862 Personal history of diseases of the blood and blood-forming organs and certain disorders involving the immune mechanism: Secondary | ICD-10-CM

## 2014-06-12 DIAGNOSIS — Z8759 Personal history of other complications of pregnancy, childbirth and the puerperium: Secondary | ICD-10-CM

## 2014-06-12 LAB — CBC
HCT: 35.6 % — ABNORMAL LOW (ref 36.0–46.0)
HCT: 33.2 % — ABNORMAL LOW (ref 36.0–46.0)
HEMOGLOBIN: 11.6 g/dL — AB (ref 12.0–15.0)
HEMOGLOBIN: 11 g/dL — AB (ref 12.0–15.0)
MCH: 23.7 pg — ABNORMAL LOW (ref 26.0–34.0)
MCH: 24 pg — AB (ref 26.0–34.0)
MCHC: 32.6 g/dL (ref 30.0–36.0)
MCHC: 33.1 g/dL (ref 30.0–36.0)
MCV: 72.8 fL — ABNORMAL LOW (ref 78.0–100.0)
MCV: 72.5 fL — ABNORMAL LOW (ref 78.0–100.0)
Platelets: 182 10*3/uL (ref 150–400)
Platelets: 162 10*3/uL (ref 150–400)
RBC: 4.89 MIL/uL (ref 3.87–5.11)
RBC: 4.58 MIL/uL (ref 3.87–5.11)
RDW: 15.5 % (ref 11.5–15.5)
RDW: 15.4 % (ref 11.5–15.5)
WBC: 14.4 10*3/uL — ABNORMAL HIGH (ref 4.0–10.5)
WBC: 13.2 10*3/uL — ABNORMAL HIGH (ref 4.0–10.5)

## 2014-06-12 LAB — COMPREHENSIVE METABOLIC PANEL
ALT: 6 U/L (ref 0–35)
AST: 19 U/L (ref 0–37)
Albumin: 3.1 g/dL — ABNORMAL LOW (ref 3.5–5.2)
Alkaline Phosphatase: 150 U/L — ABNORMAL HIGH (ref 39–117)
Anion gap: 16 — ABNORMAL HIGH (ref 5–15)
BILIRUBIN TOTAL: 0.4 mg/dL (ref 0.3–1.2)
BUN: 5 mg/dL — ABNORMAL LOW (ref 6–23)
CALCIUM: 9.6 mg/dL (ref 8.4–10.5)
CO2: 16 meq/L — AB (ref 19–32)
CREATININE: 0.51 mg/dL (ref 0.50–1.10)
Chloride: 102 mEq/L (ref 96–112)
GFR calc Af Amer: >90 mL/min (ref >90)
Glucose, Bld: 73 mg/dL (ref 70–99)
Potassium: 4.5 mEq/L (ref 3.7–5.3)
Sodium: 134 mEq/L — ABNORMAL LOW (ref 137–147)
Total Protein: 7 g/dL (ref 6.0–8.3)

## 2014-06-12 LAB — PREPARE RBC (CROSSMATCH)

## 2014-06-12 LAB — URINALYSIS, ROUTINE W REFLEX MICROSCOPIC
Bilirubin Urine: NEGATIVE
Glucose, UA: NEGATIVE mg/dL
Hgb urine dipstick: NEGATIVE
Ketones, ur: NEGATIVE mg/dL
Leukocytes, UA: NEGATIVE
Nitrite: NEGATIVE
PROTEIN: NEGATIVE mg/dL
Specific Gravity, Urine: 1.025 (ref 1.005–1.030)
UROBILINOGEN UA: 0.2 mg/dL (ref 0.0–1.0)
pH: 5.5 (ref 5.0–8.0)

## 2014-06-12 LAB — RPR

## 2014-06-12 LAB — URIC ACID: Uric Acid, Serum: 3.6 mg/dL (ref 2.4–7.0)

## 2014-06-12 LAB — PROTEIN / CREATININE RATIO, URINE
CREATININE, URINE: 87.24 mg/dL
Protein Creatinine Ratio: 0.15 (ref 0.00–0.15)
Total Protein, Urine: 12.7 mg/dL

## 2014-06-12 LAB — LACTATE DEHYDROGENASE: LDH: 288 U/L — AB (ref 94–250)

## 2014-06-12 MED ORDER — EPHEDRINE 5 MG/ML INJ
10.0000 mg | INTRAVENOUS | Status: DC | PRN
  Filled 2014-06-12: qty 2

## 2014-06-12 MED ORDER — OXYCODONE-ACETAMINOPHEN 5-325 MG PO TABS
2.0000 | ORAL_TABLET | ORAL | Status: DC | PRN
  Filled 2014-06-12 (×3): qty 2

## 2014-06-12 MED ORDER — CITRIC ACID-SODIUM CITRATE 334-500 MG/5ML PO SOLN: 30.0000 mL | ORAL | Status: DC | PRN

## 2014-06-12 MED ORDER — LIDOCAINE HCL (PF) 1 % IJ SOLN
30.0000 mL | INTRAMUSCULAR | Status: DC | PRN
  Filled 2014-06-12 (×2): qty 30

## 2014-06-12 MED ORDER — PHENYLEPHRINE 40 MCG/ML (10ML) SYRINGE FOR IV PUSH (FOR BLOOD PRESSURE SUPPORT)
PREFILLED_SYRINGE | INTRAVENOUS | Status: AC
  Filled 2014-06-12: qty 10

## 2014-06-12 MED ORDER — LACTATED RINGERS IV SOLN
500.0000 mL | Freq: Once | INTRAVENOUS | Status: AC
  Administered 2014-06-12: 500 mL via INTRAVENOUS

## 2014-06-12 MED ORDER — DIPHENHYDRAMINE HCL 50 MG/ML IJ SOLN: 12.5000 mg | INTRAMUSCULAR | Status: DC | PRN

## 2014-06-12 MED ORDER — LACTATED RINGERS IV SOLN: INTRAVENOUS | Status: DC

## 2014-06-12 MED ORDER — LIDOCAINE HCL (PF) 1 % IJ SOLN
INTRAMUSCULAR | Status: DC | PRN
  Administered 2014-06-12 (×2): 5 mL

## 2014-06-12 MED ORDER — OXYCODONE-ACETAMINOPHEN 5-325 MG PO TABS
1.0000 | ORAL_TABLET | ORAL | Status: DC | PRN
  Filled 2014-06-12 (×2): qty 1

## 2014-06-12 MED ORDER — PHENYLEPHRINE 40 MCG/ML (10ML) SYRINGE FOR IV PUSH (FOR BLOOD PRESSURE SUPPORT)
80.0000 ug | PREFILLED_SYRINGE | INTRAVENOUS | Status: DC | PRN
  Filled 2014-06-12: qty 2

## 2014-06-12 MED ORDER — FENTANYL 2.5 MCG/ML BUPIVACAINE 1/10 % EPIDURAL INFUSION (WH - ANES)
14.0000 mL/h | INTRAMUSCULAR | Status: DC | PRN
  Administered 2014-06-12: 14 mL/h via EPIDURAL

## 2014-06-12 MED ORDER — OXYTOCIN 40 UNITS IN LACTATED RINGERS INFUSION - SIMPLE MED: 62.5000 mL/h | INTRAVENOUS | Status: DC

## 2014-06-12 MED ORDER — FENTANYL 2.5 MCG/ML BUPIVACAINE 1/10 % EPIDURAL INFUSION (WH - ANES)
INTRAMUSCULAR | Status: AC
  Filled 2014-06-12: qty 125

## 2014-06-12 MED ORDER — ACETAMINOPHEN 325 MG PO TABS: 650.0000 mg | ORAL_TABLET | ORAL | Status: DC | PRN

## 2014-06-12 MED ORDER — LACTATED RINGERS IV SOLN: 500.0000 mL | INTRAVENOUS | Status: DC | PRN

## 2014-06-12 MED ORDER — PHENYLEPHRINE 40 MCG/ML (10ML) SYRINGE FOR IV PUSH (FOR BLOOD PRESSURE SUPPORT)
80.0000 ug | PREFILLED_SYRINGE | INTRAVENOUS | Status: DC | PRN
Start: 2014-06-12 — End: 2014-06-13
  Filled 2014-06-12: qty 2

## 2014-06-12 MED ORDER — FLEET ENEMA 7-19 GM/118ML RE ENEM: 1.0000 | ENEMA | RECTAL | Status: DC | PRN

## 2014-06-12 MED ORDER — BUTORPHANOL TARTRATE 1 MG/ML IJ SOLN
1.0000 mg | INTRAMUSCULAR | Status: DC | PRN
Start: 2014-06-12 — End: 2014-06-13
  Administered 2014-06-12 (×2): 1 mg via INTRAVENOUS
  Filled 2014-06-12 (×2): qty 1

## 2014-06-12 MED ORDER — OXYTOCIN 40 UNITS IN LACTATED RINGERS INFUSION - SIMPLE MED
1.0000 m[IU]/min | INTRAVENOUS | Status: DC
  Administered 2014-06-12: 1 m[IU]/min via INTRAVENOUS
  Filled 2014-06-12: qty 1000

## 2014-06-12 MED ORDER — MISOPROSTOL 25 MCG QUARTER TABLET: 25.0000 ug | ORAL_TABLET | ORAL | Status: DC | PRN

## 2014-06-12 MED ORDER — OXYTOCIN BOLUS FROM INFUSION: 500.0000 mL | INTRAVENOUS | Status: DC

## 2014-06-12 MED ORDER — TERBUTALINE SULFATE 1 MG/ML IJ SOLN
0.2500 mg | Freq: Once | INTRAMUSCULAR | Status: AC | PRN
Start: 2014-06-12 — End: 2014-06-12

## 2014-06-12 MED ORDER — ZOLPIDEM TARTRATE 5 MG PO TABS: 5.0000 mg | ORAL_TABLET | Freq: Every evening | ORAL | Status: DC | PRN

## 2014-06-12 MED ORDER — ONDANSETRON HCL 4 MG/2ML IJ SOLN: 4.0000 mg | Freq: Four times a day (QID) | INTRAMUSCULAR | Status: DC | PRN

## 2014-06-12 MED ORDER — LACTATED RINGERS IV SOLN
INTRAVENOUS | Status: DC
  Administered 2014-06-12 (×2): via INTRAVENOUS

## 2014-06-12 MED ORDER — MISOPROSTOL 200 MCG PO TABS
ORAL_TABLET | ORAL | Status: AC
  Filled 2014-06-12: qty 1

## 2014-06-12 NOTE — MAU Note (Signed)
Been having diarrhea, started last night- no one else at home has it.  Having back pain and contractions, contractions are irregular but last a long time. Decreased fetal movement- none today

## 2014-06-12 NOTE — Anesthesia Preprocedure Evaluation (Addendum)
Anesthesia Evaluation  Patient identified by MRN, date of birth, ID band Patient awake    Reviewed: Allergy & Precautions, H&P , Patient's Chart, lab work & pertinent test results  Airway Mallampati: II  TM Distance: >3 FB Neck ROM: full    Dental   Pulmonary former smoker,  breath sounds clear to auscultation        Cardiovascular hypertension, Rhythm:regular Rate:Normal     Neuro/Psych    GI/Hepatic   Endo/Other    Renal/GU      Musculoskeletal   Abdominal   Peds  Hematology   Anesthesia Other Findings   Reproductive/Obstetrics (+) Pregnancy                            Anesthesia Physical Anesthesia Plan  ASA: II  Anesthesia Plan: Epidural   Post-op Pain Management:    Induction:   Airway Management Planned:   Additional Equipment:   Intra-op Plan:   Post-operative Plan:   Informed Consent: I have reviewed the patients History and Physical, chart, labs and discussed the procedure including the risks, benefits and alternatives for the proposed anesthesia with the patient or authorized representative who has indicated his/her understanding and acceptance.     Plan Discussed with:   Anesthesia Plan Comments:        previous hemorrhage.  Will keep 2 piv and keep epidural in place until patient deemed stable post op.  Anesthesia Quick Evaluation

## 2014-06-12 NOTE — Anesthesia Procedure Notes (Signed)
Epidural Patient location during procedure: OB Start time: 06/12/2014 10:06 PM  Staffing Anesthesiologist: Brayton CavesJACKSON, Broxton Broady Performed by: anesthesiologist   Preanesthetic Checklist Completed: patient identified, site marked, surgical consent, pre-op evaluation, timeout performed, IV checked, risks and benefits discussed and monitors and equipment checked  Epidural Patient position: sitting Prep: site prepped and draped and DuraPrep Patient monitoring: continuous pulse ox and blood pressure Approach: midline Location: L3-L4 Injection technique: LOR air  Needle:  Needle type: Tuohy  Needle gauge: 17 G Needle length: 9 cm and 9 Needle insertion depth: 5 cm cm Catheter type: closed end flexible Catheter size: 19 Gauge Catheter at skin depth: 10 cm Test dose: negative  Assessment Events: blood not aspirated, injection not painful, no injection resistance, negative IV test and no paresthesia  Additional Notes Patient identified.  Risk benefits discussed including failed block, incomplete pain control, headache, nerve damage, paralysis, blood pressure changes, nausea, vomiting, reactions to medication both toxic or allergic, and postpartum back pain.  Patient expressed understanding and wished to proceed.  All questions were answered.  Sterile technique used throughout procedure and epidural site dressed with sterile barrier dressing. No paresthesia or other complications noted.The patient did not experience any signs of intravascular injection such as tinnitus or metallic taste in mouth nor signs of intrathecal spread such as rapid motor block. Please see nursing notes for vital signs.

## 2014-06-12 NOTE — Progress Notes (Addendum)
Subjective: Assumed care of this 23 yo [id number] @ 38.2 wks who was admitted today secondary to persistent variables and advanced dilatation. States ctxs have now become unbearable and requests IV pain medication. Reports active fetus. Denies headache, visual changes, RUQ pain, CP, SOB, N/V, weakness or VB. Family at bedside.  Objective: BP 137/81 mmHg  Pulse 82  Temp(Src) 98.9 F (37.2 C) (Axillary)  Resp 18  Ht 5\' 4"  (1.626 m)  Wt 209 lb (94.802 kg)  BMI 35.86 kg/m2  LMP 09/07/2013   Lungs: CTAB CV: RRR Abdomen: EFW ~ 7# 10 oz by Leopolds Extremities: 1/1 DTRs, no clonus, no edema FHT: BL FHR 130 w/ moderate variability, +accels, intermittent mild variables UC:   regular, every 4 minutes SVE: Deferred    Pitocin at 7 mius/min Results for orders placed or performed during the hospital encounter of 06/12/14 (from the past 24 hour(s))  CBC     Status: Abnormal   Collection Time: 06/12/14 12:50 PM  Result Value Ref Range   WBC 14.4 (H) 4.0 - 10.5 K/uL   RBC 4.89 3.87 - 5.11 MIL/uL   Hemoglobin 11.6 (L) 12.0 - 15.0 g/dL   HCT 45.435.6 (L) 09.836.0 - 11.946.0 %   MCV 72.8 (L) 78.0 - 100.0 fL   MCH 23.7 (L) 26.0 - 34.0 pg   MCHC 32.6 30.0 - 36.0 g/dL   RDW 14.715.5 82.911.5 - 56.215.5 %   Platelets 182 150 - 400 K/uL  Comprehensive metabolic panel     Status: Abnormal   Collection Time: 06/12/14 12:50 PM  Result Value Ref Range   Sodium 134 (L) 137 - 147 mEq/L   Potassium 4.5 3.7 - 5.3 mEq/L   Chloride 102 96 - 112 mEq/L   CO2 16 (L) 19 - 32 mEq/L   Glucose, Bld 73 70 - 99 mg/dL   BUN 5 (L) 6 - 23 mg/dL   Creatinine, Ser 1.300.51 0.50 - 1.10 mg/dL   Calcium 9.6 8.4 - 86.510.5 mg/dL   Total Protein 7.0 6.0 - 8.3 g/dL   Albumin 3.1 (L) 3.5 - 5.2 g/dL   AST 19 0 - 37 U/L   ALT 6 0 - 35 U/L   Alkaline Phosphatase 150 (H) 39 - 117 U/L   Total Bilirubin 0.4 0.3 - 1.2 mg/dL   GFR calc non Af Amer >90 >90 mL/min   GFR calc Af Amer >90 >90 mL/min   Anion gap 16 (H) 5 - 15  Lactate dehydrogenase     Status:  Abnormal   Collection Time: 06/12/14 12:50 PM  Result Value Ref Range   LDH 288 (H) 94 - 250 U/L  Uric acid     Status: None   Collection Time: 06/12/14 12:50 PM  Result Value Ref Range   Uric Acid, Serum 3.6 2.4 - 7.0 mg/dL  Type and screen     Status: None   Collection Time: 06/12/14 12:50 PM  Result Value Ref Range   ABO/RH(D) A POS    Antibody Screen NEG    Sample Expiration 06/15/2014   Urinalysis, Routine w reflex microscopic     Status: Abnormal   Collection Time: 06/12/14  1:36 PM  Result Value Ref Range   Color, Urine YELLOW YELLOW   APPearance HAZY (A) CLEAR   Specific Gravity, Urine 1.025 1.005 - 1.030   pH 5.5 5.0 - 8.0   Glucose, UA NEGATIVE NEGATIVE mg/dL   Hgb urine dipstick NEGATIVE NEGATIVE   Bilirubin Urine NEGATIVE NEGATIVE   Ketones,  ur NEGATIVE NEGATIVE mg/dL   Protein, ur NEGATIVE NEGATIVE mg/dL   Urobilinogen, UA 0.2 0.0 - 1.0 mg/dL   Nitrite NEGATIVE NEGATIVE   Leukocytes, UA NEGATIVE NEGATIVE  Protein / creatinine ratio, urine     Status: None   Collection Time: 06/12/14  1:37 PM  Result Value Ref Range   Creatinine, Urine 87.24 mg/dL   Total Protein, Urine 12.7 mg/dL   Protein Creatinine Ratio 0.15 0.00 - 0.15    Assessment:  IUP at 38.2 wks Active labor Intermittent mild variables, otherwise RNST GBS neg Labile BPs. Normal PCR. LDH elevated at 288; remainder of work-up [facility]  Plan: Reviewed plan of care w/ Dr. Charlotta Newtonzan who recommended the following: 1) Prepare crossmatch (2 units)   2) 2nd IV line 3) Cytotec available in room  Will also plan active management of 3rd stage labor, along w/ controlled cord traction and uterine massage to expedite placenta delivery. Explained above plan/rationale to both pt and mother who concur. IUPC with next cervical exam; amnioinfusion prn. Consult prn. Anticipate progress and SVD.   [name], Bryttani Blew CNM 06/12/2014, 7:30 PM

## 2014-06-12 NOTE — Progress Notes (Signed)
S: Received epidural ~ 30 min ago, however not yet fully comfortable.  O: BP 137/89, P-86 R-20; afebrile. Cervical exam deferred. BL FHR 130 w/ moderate variability, +accels, intermittent mod variables (improve w/ position changes) Ctxs: q 3-4 min, however MVUs continue to be inadequate Cervical exam deferred Pitocin at 8 mius/min  A: 23 yo [id number] @ 38.2 wks Cat 2 FHRT GBS neg Slow progress  P: Begin amnioinfusion Place urinary catheter Monitor closely Consult as indicated Anticipate progress and SVD   Sherre ScarletKimberly Lady Wisham, CNM 06/12/14, 10:48 PM

## 2014-06-12 NOTE — Progress Notes (Addendum)
  Subjective: Now on Birthing Suite, and toddler son has been taken home by family.   Objective: BP 123/92 mmHg  Pulse 88  Temp(Src) 97.4 F (36.3 C) (Oral)  Resp 18  Ht 5\' 4"  (1.626 m)  Wt 209 lb (94.802 kg)  BMI 35.86 kg/m2  LMP 09/07/2013      Filed Vitals:   06/12/14 1107 06/12/14 1125 06/12/14 1231 06/12/14 1417  BP: 137/88 126/88 136/85 123/92  Pulse: 97 105 97 88  Temp: 97.5 F (36.4 C)  97.4 F (36.3 C)   TempSrc: Oral  Oral   Resp: 18  20 18   Height: 5\' 4"  (1.626 m)     Weight: 209 lb (94.802 kg)       FHT: Category 1 UC:   irregular, every 4-6 minutes SVE:   Dilation: 5 Effacement (%): 70, 80 Station: -1 Exam by:: Manfred ArchV Navin Dogan, CNM Cervix still posterior, but very soft.  Results for orders placed or performed during the hospital encounter of 06/12/14 (from the past 24 hour(s))  Comprehensive metabolic panel     Status: Abnormal   Collection Time: 06/12/14 12:50 PM  Result Value Ref Range   Sodium 134 (L) 137 - 147 mEq/L   Potassium 4.5 3.7 - 5.3 mEq/L   Chloride 102 96 - 112 mEq/L   CO2 16 (L) 19 - 32 mEq/L   Glucose, Bld 73 70 - 99 mg/dL   BUN 5 (L) 6 - 23 mg/dL   Creatinine, Ser 0.860.51 0.50 - 1.10 mg/dL   Calcium 9.6 8.4 - 57.810.5 mg/dL   Total Protein 7.0 6.0 - 8.3 g/dL   Albumin 3.1 (L) 3.5 - 5.2 g/dL   AST 19 0 - 37 U/L   ALT 6 0 - 35 U/L   Alkaline Phosphatase 150 (H) 39 - 117 U/L   Total Bilirubin 0.4 0.3 - 1.2 mg/dL   GFR calc non Af Amer >90 >90 mL/min   GFR calc Af Amer >90 >90 mL/min   Anion gap 16 (H) 5 - 15  Lactate dehydrogenase     Status: Abnormal   Collection Time: 06/12/14 12:50 PM  Result Value Ref Range   LDH 288 (H) 94 - 250 U/L  Uric acid     Status: None   Collection Time: 06/12/14 12:50 PM  Result Value Ref Range   Uric Acid, Serum 3.6 2.4 - 7.0 mg/dL  Urinalysis, Routine w reflex microscopic     Status: Abnormal   Collection Time: 06/12/14  1:36 PM  Result Value Ref Range   Color, Urine YELLOW YELLOW   APPearance HAZY (A)  CLEAR   Specific Gravity, Urine 1.025 1.005 - 1.030   pH 5.5 5.0 - 8.0   Glucose, UA NEGATIVE NEGATIVE mg/dL   Hgb urine dipstick NEGATIVE NEGATIVE   Bilirubin Urine NEGATIVE NEGATIVE   Ketones, ur NEGATIVE NEGATIVE mg/dL   Protein, ur NEGATIVE NEGATIVE mg/dL   Urobilinogen, UA 0.2 0.0 - 1.0 mg/dL   Nitrite NEGATIVE NEGATIVE   Leukocytes, UA NEGATIVE NEGATIVE   PCR in progress   Assessment:  Early labor with advanced dilation Mild elevation of BP GBS negative  Plan: Start pitocin augmentation to facilitate improved quality of contractions. Monitor BP. Await PCR.  Nigel BridgemanLATHAM, Braidyn Peace CNM 06/12/2014, 2:53 PM

## 2014-06-12 NOTE — Progress Notes (Addendum)
  Subjective: Requesting more IV pain medication. States "trying to hold off on epidural." IV medication helpful for the most part; states able to nap between ctxs. Rates pain as 8/10.  Objective: BP 131/86 mmHg  Pulse 83  Temp(Src) 98.6 F (37 C) (Axillary)  Resp 18  Ht 5\' 4"  (1.626 m)  Wt 209 lb (94.802 kg)  BMI 35.86 kg/m2  LMP 09/07/2013     FHT: BL 130 w/ moderate variability, +accels, occ early UC:   irregular, every 2-3 minutes, palpate mild SVE: Unchanged from previous exam    Pitocin at 8 mius/min IUPC placed w/o difficulty Continues to leak clear fluid Good uop  Assessment:  IUP at 38.2 wks Cat 1 FHRT Inadequate MVUs Elevated BMI GBS neg   Plan: Increase Pitocin until MVUs of at least 180 obtained Epidural as desired   Natasha Smith, Natasha Smith CNM 06/12/2014, 9:11 PM

## 2014-06-12 NOTE — H&P (Signed)
Natasha Smith is a 23 y.o. female, G5P2002 at 2338 2/7 weeks, presenting for UCs, mucus d/c, loose BM x 24 hours.  Cervix was 2 cm on last exam in office.  Patient Active Problem List   Diagnosis Date Noted  . Hx of postpartum hemorrhage, currently pregnant 06/12/2014  . Normal labor 06/12/2014  . Sickle cell trait 04/16/2014  . 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 E-coli UTI  04/16/2014  . Late prenatal care--20 weeks. 04/16/2014  . Elevated blood pressure--2008 with pregnancy 08/06/2013  . 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 Menorrhagia 01/04/2012  . OBESITY 03/03/2008  . ECZEMA, ATOPIC DERMATITIS 09/07/2006    History of present pregnancy: Patient entered care at 20 1/7 weeks.   EDC of 06/24/14  was established by US at 5 weeks and 12 3/7 weeks in MAU.   Anatomy scan:  20 1/7 weeks, with limited anatomy and an posterior placenta. Additional US evaluations:   23 1/7 weeks for completion of anatomy--normal findings. 37 weeks in MAU--BPP 8/8, AFI WNL Significant prenatal events:  Entered care at 20 weeks.  Given ATB for Ecoli urine culture, but did not complete course.  Urine rechecked at 33 weeks due to burning, with negative culture.  Taking Valtrex since approx 37 weeks.  Denied any outbreak or prodrome during pregnancy.  Sent to MAU at 37 1/7 weeks due to N/V/faintness, seen in office with labile BPs--received IV hydration, had negative PIH labs and PCR.  BP overall WNL, somewhat labile.  Last evaluation:  06/10/14--cervix 2 cm, 50%, vtx, -2, BP 100/64  OB History    Gravida Para Term Preterm AB TAB SAB Ectopic Multiple Living   5 2 2  0 2 1 1  0 0 2    2008--SVB, 40 weeks, 12 hours, 8+7, female, [facility] 2010--SAB at [redacted] weeks gestation 2012--SVB, 38 weeks, 11 hour labor, 7+8, female, delivered by Dr. Ambrose MantleHenley.  Had early pp hemorrhage, treated with Hemabate and placement of Bakri balloon. 2014--SAB at 8 weeks.  Past Medical History  Diagnosis Date   . Sickle cell trait   . Abnormal Pap smear   . History of chlamydia infection   . Pregnancy induced hypertension     hx  . Herpes simplex type II infection   . Headache   . Infection     UTI   Past Surgical History  Procedure Laterality Date  . Knee surgery     Family History: family history includes Asthma in her brother, mother, and sister; Cancer in her maternal aunt; Diabetes in her maternal grandmother; Heart disease in her maternal grandfather; Hypertension in her mother.   Social History:  reports that she has quit smoking. She has never used smokeless tobacco. She reports that she does not drink alcohol or use illicit drugs.  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      Prenatal Transfer Tool  Maternal Diabetes: No Genetic Screening: Normal Maternal Ultrasounds/Referrals: Normal Fetal Ultrasounds or other Referrals:  None Maternal Substance Abuse:  No Significant Maternal Medications:  None Significant Maternal Lab Results: Lab values include: Group B Strep negative    ROS:  Contractions, loose stools, +FM, mucus d/c.  No Known Allergies   Dilation: 4.5 (bulging membranes) Effacement (%): 70 Station: -2 Exam by:: Manfred ArchV. Markon Jares, CNM Blood pressure 126/88, pulse 105, temperature 97.5 F (36.4 C), temperature source Oral, resp. rate 18, height 5\' 4"  (1.626 m), weight 209 lb (94.802 kg),  last menstrual period 09/07/2013.  Chest clear Heart RRR without murmur Abd gravid, NT, FH 38 cm Pelvic: 4-5 cm, 75%, vtx, BBOW, -2, slightly posterior Exam of external vulva and vagina/cervix shows no lesions. Ext: DTR 1+, no clonus, trace edema  FHR: Category 2, with mild variables with most UCs UCs:  q 4-8 min, mild/moderate  Prenatal labs: ABO, Rh:  A+ Antibody:  Neg Rubella:   Immune RPR:   NR HBsAg:   Neg HIV:   NR GBS: Negative (11/12 0000)Negative 05/22/14 Sickle cell/Hgb electrophoresis:  AS Pap:   GC:  Negative 01/27/14,  04/16/14 Chlamydia:  Negative 01/27/14, 04/16/14 Genetic screenings:  Normal Quad screen Glucola:  WNL Other:  Hgb 12 at NOB, 11.6 at 28 weeks.   Normal PIH labs and PCR 06/03/14 Ecoli on NOB urine culture, 100K mixed species on recheck. Varicella immune.      Assessment/Plan: IUP at 38 2/7 weeks Early labor Category 2 FHR, with mild variables with most UCs GBS negative Hx HSV--no recent/current lesions or prodrome. Hx pp hemorrhage  [facility] Dr[facility] Normand Sloopillard Routine CCCB orders Plan pitocin augmentation due to Category 2 FHR and need for progression of labor. Plans epidural. Close observation of FHR status. Anticipatory care for increased risk of pp hemorrhage. PIH labs with PCR today.   Nyra CapesLATHAM, VICKICNM, [place] 06/12/2014, 12:27 PM

## 2014-06-12 NOTE — Progress Notes (Signed)
  Subjective: Feeling some contractions, but not very uncomfortable.  Friend at bedside.  Objective: BP 125/77 mmHg  Pulse 82  Temp(Src) 98.9 F (37.2 C) (Axillary)  Resp 18  Ht 5\' 4"  (1.626 m)  Wt 209 lb (94.802 kg)  BMI 35.86 kg/m2  LMP 09/07/2013      Filed Vitals:   06/12/14 1630 06/12/14 1700 06/12/14 1730 06/12/14 1756  BP: 116/67 113/67 125/77   Pulse: 94 85 82   Temp:    98.9 F (37.2 C)  TempSrc:    Axillary  Resp: 20 18 18    Height:      Weight:       Hgb 11.6 Platelets 182  PCR 0.15  FHT: Category 1 overall, occasional quick variables, moderate variability. UC:   irregular, every 4-6 minutes SVE:   Dilation: 6.5 Effacement (%): 70, 80 Station: -1 Exam by:: Manfred ArchV Drayce Tawil, CNM  Cervix very stretchy. BBOW--AROM, clear fluid Pitocin at 6 mu/min  Assessment:  Progressive labor  Plan: Continue current plan. Pain med/epidural prn.  Nigel BridgemanLATHAM, Dulcy Sida CNM 06/12/2014, 6:09 PM

## 2014-06-13 ENCOUNTER — Encounter (HOSPITAL_COMMUNITY): Payer: Self-pay

## 2014-06-13 ENCOUNTER — Inpatient Hospital Stay (HOSPITAL_COMMUNITY): Payer: Medicaid Other

## 2014-06-13 LAB — CBC
HCT: 26.6 % — ABNORMAL LOW (ref 36.0–46.0)
HEMATOCRIT: 29.5 % — AB (ref 36.0–46.0)
HEMOGLOBIN: 10 g/dL — AB (ref 12.0–15.0)
Hemoglobin: 8.8 g/dL — ABNORMAL LOW (ref 12.0–15.0)
MCH: 24.4 pg — ABNORMAL LOW (ref 26.0–34.0)
MCH: 23.8 pg — AB (ref 26.0–34.0)
MCHC: 33.9 g/dL (ref 30.0–36.0)
MCHC: 33.1 g/dL (ref 30.0–36.0)
MCV: 72 fL — ABNORMAL LOW (ref 78.0–100.0)
MCV: 71.9 fL — AB (ref 78.0–100.0)
Platelets: 173 10*3/uL (ref 150–400)
Platelets: 180 10*3/uL (ref 150–400)
RBC: 4.1 MIL/uL (ref 3.87–5.11)
RBC: 3.7 MIL/uL — AB (ref 3.87–5.11)
RDW: 15.2 % (ref 11.5–15.5)
RDW: 15.3 % (ref 11.5–15.5)
WBC: 17.3 10*3/uL — ABNORMAL HIGH (ref 4.0–10.5)
WBC: 16.1 10*3/uL — ABNORMAL HIGH (ref 4.0–10.5)

## 2014-06-13 LAB — HIV ANTIBODY (ROUTINE TESTING W REFLEX): HIV 1&2 Ab, 4th Generation: NONREACTIVE

## 2014-06-13 MED ORDER — WITCH HAZEL-GLYCERIN EX PADS: 1.0000 "application " | MEDICATED_PAD | CUTANEOUS | Status: DC | PRN

## 2014-06-13 MED ORDER — ZOLPIDEM TARTRATE 5 MG PO TABS: 5.0000 mg | ORAL_TABLET | Freq: Every evening | ORAL | Status: DC | PRN

## 2014-06-13 MED ORDER — MISOPROSTOL 200 MCG PO TABS
ORAL_TABLET | ORAL | Status: AC
  Filled 2014-06-13: qty 5

## 2014-06-13 MED ORDER — ONDANSETRON HCL 4 MG PO TABS: 4.0000 mg | ORAL_TABLET | ORAL | Status: DC | PRN

## 2014-06-13 MED ORDER — IBUPROFEN 600 MG PO TABS
ORAL_TABLET | ORAL | Status: AC
  Filled 2014-06-13: qty 1

## 2014-06-13 MED ORDER — DIPHENOXYLATE-ATROPINE 2.5-0.025 MG PO TABS
ORAL_TABLET | ORAL | Status: AC
  Filled 2014-06-13: qty 2

## 2014-06-13 MED ORDER — TETANUS-DIPHTH-ACELL PERTUSSIS 5-2.5-18.5 LF-MCG/0.5 IM SUSP: 0.5000 mL | Freq: Once | INTRAMUSCULAR | Status: DC

## 2014-06-13 MED ORDER — OXYTOCIN 40 UNITS IN LACTATED RINGERS INFUSION - SIMPLE MED
250.0000 mL/h | INTRAVENOUS | Status: DC
  Administered 2014-06-13: 250 mL/h via INTRAVENOUS

## 2014-06-13 MED ORDER — ACETAMINOPHEN 500 MG PO TABS
ORAL_TABLET | ORAL | Status: AC
  Filled 2014-06-13: qty 2

## 2014-06-13 MED ORDER — CARBOPROST TROMETHAMINE 250 MCG/ML IM SOLN
250.0000 ug | INTRAMUSCULAR | Status: DC | PRN
Start: 2014-06-13 — End: 2014-06-14
  Administered 2014-06-13: 250 ug via INTRAMUSCULAR

## 2014-06-13 MED ORDER — METHYLERGONOVINE MALEATE 0.2 MG/ML IJ SOLN: 0.2000 mg | Freq: Once | INTRAMUSCULAR | Status: DC

## 2014-06-13 MED ORDER — OXYTOCIN 40 UNITS IN LACTATED RINGERS INFUSION - SIMPLE MED
INTRAVENOUS | Status: AC
  Filled 2014-06-13: qty 1000

## 2014-06-13 MED ORDER — OXYCODONE-ACETAMINOPHEN 5-325 MG PO TABS
2.0000 | ORAL_TABLET | ORAL | Status: DC | PRN
  Administered 2014-06-13 (×3): 2 via ORAL

## 2014-06-13 MED ORDER — CARBOPROST TROMETHAMINE 250 MCG/ML IM SOLN
250.0000 ug | INTRAMUSCULAR | Status: DC | PRN
Start: 2014-06-13 — End: 2014-06-13

## 2014-06-13 MED ORDER — SIMETHICONE 80 MG PO CHEW: 80.0000 mg | CHEWABLE_TABLET | ORAL | Status: DC | PRN

## 2014-06-13 MED ORDER — DIPHENHYDRAMINE HCL 25 MG PO CAPS: 25.0000 mg | ORAL_CAPSULE | Freq: Four times a day (QID) | ORAL | Status: DC | PRN

## 2014-06-13 MED ORDER — OXYCODONE-ACETAMINOPHEN 5-325 MG PO TABS
1.0000 | ORAL_TABLET | ORAL | Status: DC | PRN
  Administered 2014-06-14 (×2): 1 via ORAL

## 2014-06-13 MED ORDER — CARBOPROST TROMETHAMINE 250 MCG/ML IM SOLN
INTRAMUSCULAR | Status: AC
  Filled 2014-06-13: qty 1

## 2014-06-13 MED ORDER — FERROUS SULFATE 325 (65 FE) MG PO TABS
325.0000 mg | ORAL_TABLET | Freq: Two times a day (BID) | ORAL | Status: DC
  Administered 2014-06-13 – 2014-06-14 (×3): 325 mg via ORAL
  Filled 2014-06-13 (×2): qty 1

## 2014-06-13 MED ORDER — PRENATAL PLUS 27-1 MG PO TABS
ORAL_TABLET | ORAL | Status: AC
  Administered 2014-06-14: 1 via ORAL
  Filled 2014-06-13: qty 1

## 2014-06-13 MED ORDER — VITAMIN K1 1 MG/0.5ML IJ SOLN
INTRAMUSCULAR | Status: AC
Start: 2014-06-13 — End: 2014-06-13
  Filled 2014-06-13: qty 0.5

## 2014-06-13 MED ORDER — IBUPROFEN 600 MG PO TABS
ORAL_TABLET | ORAL | Status: AC
  Administered 2014-06-14: 600 mg via ORAL
  Filled 2014-06-13: qty 1

## 2014-06-13 MED ORDER — FERROUS SULFATE 325 (65 FE) MG PO TABS
ORAL_TABLET | ORAL | Status: AC
  Administered 2014-06-14: 325 mg via ORAL
  Filled 2014-06-13: qty 1

## 2014-06-13 MED ORDER — METHYLERGONOVINE MALEATE 0.2 MG PO TABS
ORAL_TABLET | ORAL | Status: AC
  Filled 2014-06-13: qty 1

## 2014-06-13 MED ORDER — ONDANSETRON HCL 4 MG/2ML IJ SOLN
4.0000 mg | INTRAMUSCULAR | Status: DC | PRN
  Administered 2014-06-13: 4 mg via INTRAVENOUS

## 2014-06-13 MED ORDER — MISOPROSTOL 200 MCG PO TABS: 800.0000 ug | ORAL_TABLET | Freq: Once | ORAL | Status: DC

## 2014-06-13 MED ORDER — SENNOSIDES-DOCUSATE SODIUM 8.6-50 MG PO TABS: 2.0000 | ORAL_TABLET | ORAL | Status: DC

## 2014-06-13 MED ORDER — DIBUCAINE 1 % RE OINT
1.0000 | TOPICAL_OINTMENT | RECTAL | Status: DC | PRN
Start: 2014-06-13 — End: 2014-06-14

## 2014-06-13 MED ORDER — ACETAMINOPHEN 500 MG PO TABS: 1000.0000 mg | ORAL_TABLET | Freq: Four times a day (QID) | ORAL | Status: DC | PRN

## 2014-06-13 MED ORDER — ACETAMINOPHEN 500 MG PO TABS: 1000.0000 mg | ORAL_TABLET | Freq: Once | ORAL | Status: DC

## 2014-06-13 MED ORDER — METHYLERGONOVINE MALEATE 0.2 MG PO TABS
0.2000 mg | ORAL_TABLET | Freq: Four times a day (QID) | ORAL | Status: AC
  Administered 2014-06-13 (×3): 0.2 mg via ORAL

## 2014-06-13 MED ORDER — LANOLIN HYDROUS EX OINT: TOPICAL_OINTMENT | CUTANEOUS | Status: DC | PRN

## 2014-06-13 MED ORDER — VITAMIN K1 1 MG/0.5ML IJ SOLN
INTRAMUSCULAR | Status: AC
  Filled 2014-06-13: qty 0.5

## 2014-06-13 MED ORDER — PRENATAL MULTIVITAMIN CH
1.0000 | ORAL_TABLET | Freq: Every day | ORAL | Status: DC
  Administered 2014-06-13 – 2014-06-14 (×2): 1 via ORAL
  Filled 2014-06-13: qty 1

## 2014-06-13 MED ORDER — IBUPROFEN 600 MG PO TABS
600.0000 mg | ORAL_TABLET | Freq: Four times a day (QID) | ORAL | Status: DC
  Administered 2014-06-13 – 2014-06-14 (×6): 600 mg via ORAL
  Filled 2014-06-13: qty 1

## 2014-06-13 MED ORDER — BENZOCAINE-MENTHOL 20-0.5 % EX AERO: 1.0000 "application " | INHALATION_SPRAY | CUTANEOUS | Status: DC | PRN

## 2014-06-13 MED ORDER — DIPHENOXYLATE-ATROPINE 2.5-0.025 MG PO TABS
2.0000 | ORAL_TABLET | Freq: Four times a day (QID) | ORAL | Status: DC | PRN
  Administered 2014-06-13: 2 via ORAL

## 2014-06-13 MED ORDER — SENNOSIDES-DOCUSATE SODIUM 8.6-50 MG PO TABS
ORAL_TABLET | ORAL | Status: AC
  Filled 2014-06-13: qty 2

## 2014-06-13 NOTE — Progress Notes (Signed)
CNM at bedside, new orders received and initiated. PPH protocol orders noted and intiated. Will cont to monitor.

## 2014-06-13 NOTE — Progress Notes (Signed)
Pt in stable condition, resting quietly in bed. Fundus firm at 1 below U. Noted sm amt of lochia and no clots at this time. VSS, pain decreasing per pt. Denies any other complaints at this time.

## 2014-06-13 NOTE — Progress Notes (Addendum)
S: Urge to push; states epidural not working.  O: VSS, afebrile. Cvx: C/C/+3 per RN despite inadequate MVUs. Amnioinfusion was not started due to rapid progression. 350 ml amber urine in foley bag.  A: 23 yo G5P2022 @ 38.2 wks 2nd stage labor GBS neg  P: Commence w/ pushing Prevention of PPH measures in place Anticipate SVD   Natasha Smith, CNM 06/12/14, 1122

## 2014-06-13 NOTE — Progress Notes (Signed)
Pt called out- states "Please call my nurse in here, I'm having a lot of bleeding". Upon entering room noted mod amt of bleeding on peri pad and underwear. Fundal massage done, pt assisted up to bathroom, passed lg clot in toilet, assisted back to bed. Fundus rechecked- noted increased bleeding and passed several med size clots. Lg amt of trickling blood noted. CNM called to bedside. Hemorrhage cart at bedside.

## 2014-06-13 NOTE — Lactation Note (Signed)
This note was copied from the chart of Natasha Sarina Illshley Schloss. Lactation Consultation Note  Patient Name: Natasha Smith [id number]'s Date: 06/13/2014 Reason for consult: Follow-up assessment;Difficult latch.  Mom has large/soft breasts which are compressible during latch but mom needs assistance while she is learning. LC assisted mom to latch baby in cross-cradle position on right breast and encouraged this hold while mom and baby are learning. Baby latched easily and sustained latch with rhythmical sucking for 10 minutes, after previously latching on (L) for 20 minutes.  LC reviewed signs of proper latch and milk transfer.  LC reported assessment to RN, Natasha Smith and encouraged mom to cue feed as often and as long as baby showing eagerness to feed.   Maternal Data    Feeding Feeding Type: Breast Fed Length of feed: 10 min  LATCH Score/Interventions Latch: Grasps breast easily, tongue down, lips flanged, rhythmical sucking. (cross-cradle hold) Intervention(s): Adjust position;Assist with latch;Breast compression  Audible Swallowing: Spontaneous and intermittent  Type of Nipple: Everted at rest and after stimulation  Comfort (Breast/Nipple): Soft / non-tender     Hold (Positioning): Assistance needed to correctly position infant at breast and maintain latch. Intervention(s): Breastfeeding basics reviewed;Support Pillows;Position options;Skin to skin  LATCH Score: 9 (LC assisted and observed)  Lactation Tools Discussed/Used   Breast support Cross-cradle position for better control of baby and breast Signs of proper latch and milk transfer Cue feeding STS  Consult Status Consult Status: Follow-up Date: 06/14/14 Follow-up type: In-patient    Warrick ParisianBryant, Natasha Lienhard [facility] 06/13/2014, 8:35 PM

## 2014-06-13 NOTE — Anesthesia Postprocedure Evaluation (Signed)
  Anesthesia Post-op Note  Patient: Natasha Smith  Procedure(s) Performed: * No procedures listed *  Patient Location: Mother/Baby  Anesthesia Type:Epidural  Level of Consciousness: awake and alert   Airway and Oxygen Therapy: Patient Spontanous Breathing  Post-op Pain: mild  Post-op Assessment: Post-op Vital signs reviewed, Patient's Cardiovascular Status Stable, Respiratory Function Stable, No signs of Nausea or vomiting, Pain level controlled, No headache, No residual numbness and No residual motor weakness  Post-op Vital Signs: Reviewed  Last Vitals:  Filed Vitals:   06/13/14 0605  BP: 116/69  Pulse: 72  Temp: 36.5 C  Resp: 20    Complications: No apparent anesthesia complications

## 2014-06-13 NOTE — Progress Notes (Addendum)
Subjective: Postpartum Day 1: Vaginal delivery, no lacerations Patient up ad lib, reports no syncope or dizziness. Reports bleeding only a small amount now. Feeding:  Breast/bottle Contraceptive plan:  Nexplanon  Objective: Vital signs in last 24 hours: Temp:  [97.3 F (36.3 C)-98.9 F (37.2 C)] 97.7 F (36.5 C) (12/04 0605) Pulse Rate:  [72-105] 72 (12/04 0605) Resp:  [18-22] 20 (12/04 0605) BP: (107-143)/(56-92) 116/69 mmHg (12/04 0605) SpO2:  [96 %-100 %] 100 % (12/04 0207) Weight:  [209 lb (94.802 kg)] 209 lb (94.802 kg) (12/03 1107)   Filed Vitals:   06/13/14 0445 06/13/14 0500 06/13/14 0515 06/13/14 0605  BP: 117/87 115/80 116/85 116/69  Pulse:    72  Temp:    97.7 F (36.5 C)  TempSrc:    Oral  Resp:    20  Height:      Weight:      SpO2:       Orthostatics stable.  On Methergine po course--received 1st dose 0341  Physical Exam:  General: alert Lochia: appropriate Uterine Fundus: firm Perineum: Intact perineum DVT Evaluation: No evidence of DVT seen on physical exam. Negative Homan's sign.    Recent Labs  06/12/14 2036 06/13/14 0248  HGB 11.0* 10.0*  HCT 33.2* 29.5*    Assessment/Plan: Status post vaginal delivery day 1. PP hemorrhage, stage 1 Stable Continue current care. Continue Methergine course Monitor BP. CBC recheck scheduled this am by 9a.    Nyra CapesLATHAM, VICKICNM 06/13/2014, 8:15 AM   I saw patient at bedside and agree with above findings, assessment and plan.  She has not had to change her peripad for over 5 hours.  CBC at 9 am shows Hgb 8.8 Plt 180.  Patient asymptomatic. Use iron tabs for anemia, discussed iron rich foods. Close BP monitoring, d/c methergine if elevated BPs develop.  Bleeding precautions reviewed.

## 2014-06-13 NOTE — Plan of Care (Signed)
Problem: Phase I Progression Outcomes Goal: Pain controlled with appropriate interventions Outcome: Completed/Met Date Met:  06/13/14 Goal: Voiding adequately Outcome: Completed/Met Date Met:  06/13/14 Goal: OOB as tolerated unless otherwise ordered Outcome: Completed/Met Date Met:  06/13/14 Goal: VS, stable, temp < 100.4 degrees F Outcome: Completed/Met Date Met:  06/13/14 Goal: Initial discharge plan identified Outcome: Completed/Met Date Met:  06/13/14 Goal: Other Phase I Outcomes/Goals Outcome: Completed/Met Date Met:  06/13/14  Problem: Phase II Progression Outcomes Goal: Pain controlled on oral analgesia Outcome: Completed/Met Date Met:  06/13/14 Goal: Progress activity as tolerated unless otherwise ordered Outcome: Completed/Met Date Met:  06/13/14 Goal: Afebrile, VS remain stable Outcome: Completed/Met Date Met:  06/13/14 Goal: Incision intact & without signs/symptoms of infection Outcome: Not Applicable Date Met:  39/79/53 Goal: Tolerating diet Outcome: Completed/Met Date Met:  06/13/14 Goal: Other Phase II Outcomes/Goals Outcome: Completed/Met Date Met:  06/13/14  Problem: Discharge Progression Outcomes Goal: Tolerating diet Outcome: Completed/Met Date Met:  06/13/14

## 2014-06-13 NOTE — Lactation Note (Signed)
This note was copied from the chart of Natasha Sarina Illshley Gaultney. Lactation Consultation Note  Patient Name: Natasha Smith ZOXWR'UToday's Date: 06/13/2014 Reason for consult: Initial assessment  Mom reports baby sleepy and has not latched since after delivery. She has started to supplement. Baby recently had supplement and asleep at this visit. BF basics reviewed with Mom. Stressed importance of BF with each feeding before giving any bottles to encourage milk production, prevent engorgement and protect milk supply.  Demonstrated hand expression to Mom, colostrum present. Supplementing guidelines reviewed with Mom and encouraged Mom to call with next feeding for assist with latch. Encouraged to watch for feeding ques. Discussed pumping if baby does not become interested in BF [facility] and need for stimulation to encourage milk production. Mom will advise. Lactation brochure left for review, advised of OP services and support group. Left baby STS on Mom. Encouraged to call with next feeding.   Maternal Data Has patient been taught Hand Expression?: Yes Does the patient have breastfeeding experience prior to this delivery?: Yes  Feeding Feeding Type: Formula Length of feed: 0 min  LATCH Score/Interventions                Intervention(s): Breastfeeding basics reviewed;Skin to skin     Lactation Tools Discussed/Used WIC Program: Yes   Consult Status Consult Status: Follow-up Date: 06/13/14 Follow-up type: In-patient    Natasha Smith, Natasha Smith 06/13/2014, 12:47 PM

## 2014-06-13 NOTE — Progress Notes (Signed)
Subjective: Postpartum Day 1: Vaginal delivery, no lacerations, intact perineum. [facility] and [facility]. Per RN patient was assisted out of bed to void. Upon returning to bed, dark red blood noted to undies, soaking through linen, sanitary pads and ice pack. Nurse also noted a boggy uterus. Feeding: Breast/formula. Contraceptive plan: Nexplanon long term, undecided in interim.   Objective: Vital signs in last 24 hours: Temp:  [97.3 F (36.3 C)-98.9 F (37.2 C)] 97.5 F (36.4 C) (12/04 0320) Pulse Rate:  [74-105] 101 (12/04 0320) Resp:  [18-22] 22 (12/04 0356) BP: (107-143)/(56-92) 132/80 mmHg (12/04 0356) SpO2:  [96 %-100 %] 100 % (12/04 0207) Weight:  [209 lb (94.802 kg)] 209 lb (94.802 kg) (12/03 1107)   BP range since admission = 113-143/56-92  Physical Exam:  General: alert, cooperative, no distress and mildly obese.  Lungs: CTAB. CV: RRR. Abdomen: soft, appropriately tender, non-distended; active BS x 4. Lochia: inappropriate - another 550 ml blood loss (combination of actual bleeding and several golf ball sized clots retrieved while performing uterine sweep). No evidence of placenta fragments appreciated. Uterine Fundus: firm w/ massage @ U-1. Perineum: Intact DVT Evaluation: No evidence of DVT seen on physical exam. Negative Homan's sign.  225 ml amber urine collected via straight cath.  1000 mcg Cytotec placed per rectum, however all pills expelled w/ pt's involuntary pushing, therefore one dose of Hemabate followed by Lomotil given. Pitocin IV restarted.  Recent Labs  06/12/14 2036 06/13/14 0248  HGB 11.0* 10.0*  HCT 33.2* 29.5*  WBC 14.4 on admission followed by 13.2 then 17.3 respectively  Assessment/Plan: Early PPH; stage 1 - protocol initiated. EBL 300 ml while in BS, then another 550 ml on postpartum unit. (300 ml + 550 ml = 850 ml.) Repeat CBC in 6 hrs (ordered) WBC 14.4 on admission, 17.3 this  a.m. Stable after IM hemabate (given at 0230) and Pitocin (restarted at 02:30 AM) Lomotil (given at 0250) Po pain meds as desired (2 tabs Percocet given at 0229); Ibuprofen given at 0513 Continue routine pp care. Contacted Dr. Charlotta Newtonzan who recommended starting pt on Methergine series (first dose given at 0341) (some labile BPs since admission - benefits of administering this uterotonic agent outweigh the risk associated w/ ?HTN.)   Plan of care reviewed w/ pt and pt's mother who verbalizes understanding.    Robyne AskewWILLIAMS, KIMBERLYCNM 06/13/2014, 0530

## 2014-06-14 MED ORDER — OXYCODONE-ACETAMINOPHEN 5-325 MG PO TABS: 1.0000 | ORAL_TABLET | Freq: Four times a day (QID) | ORAL | Status: DC | PRN

## 2014-06-14 MED ORDER — IBUPROFEN 600 MG PO TABS
ORAL_TABLET | ORAL | Status: AC
  Filled 2014-06-14: qty 1

## 2014-06-14 MED ORDER — FERROUS SULFATE 325 (65 FE) MG PO TABS: 325.0000 mg | ORAL_TABLET | Freq: Two times a day (BID) | ORAL | Status: DC

## 2014-06-14 MED ORDER — IBUPROFEN 600 MG PO TABS: 600.0000 mg | ORAL_TABLET | Freq: Four times a day (QID) | ORAL | Status: DC

## 2014-06-14 MED ORDER — DOCUSATE SODIUM 100 MG PO CAPS: 100.0000 mg | ORAL_CAPSULE | Freq: Two times a day (BID) | ORAL | Status: DC

## 2014-06-14 NOTE — Discharge Instructions (Signed)
Etonogestrel implant °What is this medicine? °ETONOGESTREL (et oh noe JES trel) is a contraceptive (birth control) device. It is used to prevent pregnancy. It can be used for up to 3 years. °This medicine may be used for other purposes; ask your health care provider or pharmacist if you have questions. °COMMON BRAND NAME(S): Implanon, Nexplanon °What should I tell my health care provider before I take this medicine? °They need to know if you have any of these conditions: °-abnormal vaginal bleeding °-blood vessel disease or blood clots °-cancer of the breast, cervix, or liver °-depression °-diabetes °-gallbladder disease °-headaches °-heart disease or recent heart attack °-high blood pressure °-high cholesterol °-kidney disease °-liver disease °-renal disease °-seizures °-tobacco smoker °-an unusual or allergic reaction to etonogestrel, other hormones, anesthetics or antiseptics, medicines, foods, dyes, or preservatives °-pregnant or trying to get pregnant °-breast-feeding °How should I use this medicine? °This device is inserted just under the skin on the inner side of your upper arm by a health care professional. °Talk to your pediatrician regarding the use of this medicine in children. Special care may be needed. °Overdosage: If you think you've taken too much of this medicine contact a poison control center or emergency room at once. °Overdosage: If you think you have taken too much of this medicine contact a poison control center or emergency room at once. °NOTE: This medicine is only for you. Do not share this medicine with others. °What if I miss a dose? °This does not apply. °What may interact with this medicine? °Do not take this medicine with any of the following medications: °-amprenavir °-bosentan °-fosamprenavir °This medicine may also interact with the following medications: °-barbiturate medicines for inducing sleep or treating seizures °-certain medicines for fungal infections like ketoconazole and  itraconazole °-griseofulvin °-medicines to treat seizures like carbamazepine, felbamate, oxcarbazepine, phenytoin, topiramate °-modafinil °-phenylbutazone °-rifampin °-some medicines to treat HIV infection like atazanavir, indinavir, lopinavir, nelfinavir, tipranavir, ritonavir °-St. John's wort °This list may not describe all possible interactions. Give your health care provider a list of all the medicines, herbs, non-prescription drugs, or dietary supplements you use. Also tell them if you smoke, drink alcohol, or use illegal drugs. Some items may interact with your medicine. °What should I watch for while using this medicine? °This product does not protect you against HIV infection (AIDS) or other sexually transmitted diseases. °You should be able to feel the implant by pressing your fingertips over the skin where it was inserted. Tell your doctor if you cannot feel the implant. °What side effects may I notice from receiving this medicine? °Side effects that you should report to your doctor or health care professional as soon as possible: °-allergic reactions like skin rash, itching or hives, swelling of the face, lips, or tongue °-breast lumps °-changes in vision °-confusion, trouble speaking or understanding °-dark urine °-depressed mood °-general ill feeling or flu-like symptoms °-light-colored stools °-loss of appetite, nausea °-right upper belly pain °-severe headaches °-severe pain, swelling, or tenderness in the abdomen °-shortness of breath, chest pain, swelling in a leg °-signs of pregnancy °-sudden numbness or weakness of the face, arm or leg °-trouble walking, dizziness, loss of balance or coordination °-unusual vaginal bleeding, discharge °-unusually weak or tired °-yellowing of the eyes or skin °Side effects that usually do not require medical attention (Report these to your doctor or health care professional if they continue or are bothersome.): °-acne °-breast pain °-changes in  weight °-cough °-fever or chills °-headache °-irregular menstrual bleeding °-itching, burning, and   vaginal discharge -pain or difficulty passing urine -sore throat This list may not describe all possible side effects. Call your doctor for medical advice about side effects. You may report side effects to FDA at 1-800-FDA-1088. Where should I keep my medicine? This drug is given in a hospital or clinic and will not be stored at home. NOTE: This sheet is a summary. It may not cover all possible information. If you have questions about this medicine, talk to your doctor, pharmacist, or health care provider.  2015, Elsevier/Gold Standard. (2012-01-02 15:37:45) Iron-Rich Diet An iron-rich diet contains foods that are good sources of iron. Iron is an important mineral that helps your body produce hemoglobin. Hemoglobin is a protein in red blood cells that carries oxygen to the body's tissues. Sometimes, the iron level in your blood can be low. This may be caused by:  A lack of iron in your diet.  Blood loss.  Times of growth, such as during pregnancy or during a child's growth and development. Low levels of iron can cause a decrease in the number of red blood cells. This can result in iron deficiency anemia. Iron deficiency anemia symptoms include:  Tiredness.  Weakness.  Irritability.  Increased chance of infection. Here are some recommendations for daily iron intake:  Males older than 23 years of age need 8 mg of iron per day.  Women ages 1419 to 5350 need 18 mg of iron per day.  Pregnant women need 27 mg of iron per day, and women who are over 23 years of age and breastfeeding need 9 mg of iron per day.  Women over the age of 23 need 8 mg of iron per day. SOURCES OF IRON There are 2 types of iron that are found in food: heme iron and nonheme iron. Heme iron is absorbed by the body better than nonheme iron. Heme iron is found in meat, poultry, and fish. Nonheme iron is found in grains,  beans, and vegetables. Heme Iron Sources Food / Iron (mg)  Chicken liver, 3 oz (85 g)/ 10 mg  Beef liver, 3 oz (85 g)/ 5.5 mg  Oysters, 3 oz (85 g)/ 8 mg  Beef, 3 oz (85 g)/ 2 to 3 mg  Shrimp, 3 oz (85 g)/ 2.8 mg  [place]ur[place], 3 oz (85 g)/ 2 mg  Chicken, 3 oz (85 g) / 1 mg  Fish (tuna, halibut), 3 oz (85 g)/ 1 mg  Pork, 3 oz (85 g)/ 0.9 mg Nonheme Iron Sources Food / Iron (mg)  Ready-to-eat breakfast cereal, iron-fortified / 3.9 to 7 mg  Tofu,  cup / 3.4 mg  Kidney beans,  cup / 2.6 mg  Baked potato with skin / 2.7 mg  Asparagus,  cup / 2.2 mg  Avocado / 2 mg  Dried peaches,  cup / 1.6 mg  Raisins,  cup / 1.5 mg  Soy milk, 1 cup / 1.5 mg  Whole-wheat bread, 1 slice / 1.2 mg  Spinach, 1 cup / 0.8 mg  Broccoli,  cup / 0.6 mg IRON ABSORPTION Certain foods can decrease the body's absorption of iron. Try to avoid these foods and beverages while eating meals with iron-containing foods:  Coffee.  Tea.  Fiber.  Soy. Foods containing vitamin C can help increase the amount of iron your body absorbs from iron sources, especially from nonheme sources. Eat foods with vitamin C along with iron-containing foods to increase your iron absorption. Foods that are high in vitamin C include many fruits and vegetables.  Some good sources are:  Fresh orange juice.  Oranges.  Strawberries.  Mangoes.  Grapefruit.  Red bell peppers.  Green bell peppers.  Broccoli.  Potatoes with skin.  Tomato juice. Document Released: 02/08/2005 Document Revised: 09/19/2011 Document Reviewed: 12/16/2010 [facility] Patient Information 2015 [place], [place]. This information is not intended to replace advice given to you by your health care provider. Make sure you discuss any questions you have with your health care provider.

## 2014-06-14 NOTE — Lactation Note (Signed)
This note was copied from the chart of Natasha Smith. Lactation Consultation Note  Mother did not feed baby from 2230 to 0647 last night.  Discussed setting an alarm to wake to feed baby.  Mom encouraged to feed baby 8-12 times/24 hours and with feeding cues.  Reviewed supply, demand, monitoring voids/stools and engorgement care. Mother supplementing with formula.  Suggest she breastfeed often to establish her milk supply. Encouraged her to call if she needs assistance.  Patient Name: Natasha Sarina Illshley Macmillan [id number]'s Date: 06/14/2014 Reason for consult: Follow-up assessment   Maternal Data    Feeding Feeding Type: Breast Fed Length of feed: 60 min  LATCH Score/Interventions Latch: Grasps breast easily, tongue down, lips flanged, rhythmical sucking. Intervention(s): Adjust position;Assist with latch;Breast massage;Breast compression  Audible Swallowing: A few with stimulation Intervention(s): Skin to skin;Hand expression  Type of Nipple: Everted at rest and after stimulation  Comfort (Breast/Nipple): Soft / non-tender     Hold (Positioning): Assistance needed to correctly position infant at breast and maintain latch. Intervention(s): Breastfeeding basics reviewed;Support Pillows;Position options;Skin to skin  LATCH Score: 8  Lactation Tools Discussed/Used     Consult Status Consult Status: Complete    Hardie PulleyBerkelhammer, Deddrick Saindon Boschen 06/14/2014, 10:59 AM

## 2014-06-14 NOTE — Discharge Summary (Signed)
Vaginal Delivery Discharge Summary  Natasha Hymenshley N Tabb  DOB:    05/06/1991 MRN:    [id number] CSN:    [id number]  Date of admission:                  Jun 12, 2014  Date of discharge:                   Jun 14, 2014  Procedures this admission:   SVD with Immediate PPH, methergine series  Date of Delivery: Jun 12, 2014  Newborn Data:  Live born female  Birth Weight: 6 lb 14.1 oz (3120 g) APGAR: 8, 9  Home with mother. Name: Natasha Smith Circumcision Plan: N/A  History of Present Illness:  Ms. Natasha Smith is a 23 y.o. female, [phone], who presents at 1219w2d weeks gestation. [facility] She was admitted onset of labor. Her pregnancy has been complicated by:  Patient Active Problem List   Diagnosis Date Noted  . Immediate postpartum hemorrhage, postpartum 06/13/2014  . NSVD (normal spontaneous vaginal delivery) 06/12/2014  . History of postpartum hemorrhage 06/12/2014  . Sickle cell trait 04/16/2014  . 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 Elevated blood pressure--2008 with pregnancy 08/06/2013  . 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 Menorrhagia 01/04/2012  . OBESITY 03/03/2008  . ECZEMA, ATOPIC DERMATITIS 09/07/2006     Hospital Course:  Admitted 06/12/14 in active labor with reports of UC, loose stool x 24hrs, and mucoid discharge. Negative GBS. Progressed with AROM and pitocin augmentation. Utilized epidural for pain management.  Delivery was performed by K.Mayford KnifeWilliams, CNM without complication. Patient and baby tolerated the procedure without difficulty, with  no lacerations noted.  Mother did have a PPH approximately 2 hours after delivery.  At that time she received hemabate, restarted on pitocin, and was later started on methergine series x 24 hours.  Infant status was stable and remained in room with mother.  Mother and infant then had an uncomplicated postpartum  course, with breast feeding going well.  However, she was started on iron supplementation for asymptomatic anemia. Mom's physical exam was WNL, and she was discharged home in stable condition. Contraception plan was nexplanon.  She received adequate benefit from po pain medications.    Feeding:  breast  Contraception:  Nexplanon  Discharge hemoglobin:  HEMOGLOBIN  Date Value Ref Range Status  06/13/2014 8.8* 12.0 - 15.0 g/dL Final  13/08/657807/24/2013 46.912.0* 12.2 - 16.2 g/dL Final    Comment:    capillary sample   HCT  Date Value Ref Range Status  06/13/2014 26.6* 36.0 - 46.0 % Final    Discharge Physical Exam:   General: alert, cooperative and no distress Lochia: appropriate Uterine Fundus: firm, U/-4 Incision: None DVT Evaluation: No evidence of DVT seen on physical exam. No significant calf/ankle edema.  Intrapartum Procedures: spontaneous vaginal delivery Postpartum Procedures: PPH Protocol Complications-Operative and Postpartum: none  Discharge Diagnoses: Term Pregnancy-delivered  Discharge Information:  Activity:           pelvic rest Diet:                routine Medications: PNV, Ibuprofen, Colace, Iron and Percocet Condition:      stable Instructions:  Pain Management, Peri-Care, Breastfeeding, Who and When to call for postpartum complications.   Discharge to: home  Follow-up Information    [facility]. Schedule an  appointment as soon as possible for a visit in 5 weeks.   Specialty:  Obstetrics and Gynecology   Why:  Please call if you have any questions or concerns prior to your next visit.   Contact information:   [address] [phone]       Marlene BastMLY, Maleni Seyer LYNN MSN, CNM 06/14/2014 9:17 AM

## 2014-06-14 NOTE — Plan of Care (Signed)
Problem: Discharge Progression Outcomes Goal: Barriers To Progression Addressed/Resolved Outcome: Completed/Met Date Met:  06/14/14 Goal: Activity appropriate for discharge plan Outcome: Completed/Met Date Met:  69/79/48 Goal: Complications resolved/controlled Outcome: Completed/Met Date Met:  06/14/14 Goal: Pain controlled with appropriate interventions Outcome: Completed/Met Date Met:  06/14/14 Goal: Afebrile, VS remain stable at discharge Outcome: Completed/Met Date Met:  06/14/14 Goal: Discharge plan in place and appropriate Outcome: Completed/Met Date Met:  06/14/14 Goal: Other Discharge Outcomes/Goals Outcome: Completed/Met Date Met:  06/14/14

## 2014-06-15 LAB — TYPE AND SCREEN
ABO/RH(D): A POS
ANTIBODY SCREEN: NEGATIVE
UNIT DIVISION: 0
Unit division: 0

## 2014-06-16 NOTE — Progress Notes (Signed)
Post discharge ur review completed. 

## 2014-11-24 DIAGNOSIS — Z029 Encounter for administrative examinations, unspecified: Secondary | ICD-10-CM

## 2014-12-16 ENCOUNTER — Ambulatory Visit: Payer: Medicaid Other | Admitting: Family Medicine

## 2015-01-06 ENCOUNTER — Ambulatory Visit (INDEPENDENT_AMBULATORY_CARE_PROVIDER_SITE_OTHER): Payer: Medicaid Other | Admitting: Family Medicine

## 2015-01-06 ENCOUNTER — Encounter: Payer: Self-pay | Admitting: Family Medicine

## 2015-01-06 VITALS — BP 132/98 | HR 73 | Temp 98.0°F | Wt 179.0 lb

## 2015-01-06 DIAGNOSIS — IMO0001 Reserved for inherently not codable concepts without codable children: Secondary | ICD-10-CM

## 2015-01-06 DIAGNOSIS — R030 Elevated blood-pressure reading, without diagnosis of hypertension: Secondary | ICD-10-CM | POA: Diagnosis not present

## 2015-01-06 DIAGNOSIS — B009 Herpesviral infection, unspecified: Secondary | ICD-10-CM | POA: Diagnosis not present

## 2015-01-06 DIAGNOSIS — N921 Excessive and frequent menstruation with irregular cycle: Secondary | ICD-10-CM | POA: Diagnosis not present

## 2015-01-06 LAB — CBC
HCT: 32.1 % — ABNORMAL LOW (ref 36.0–46.0)
HEMOGLOBIN: 9.3 g/dL — AB (ref 12.0–15.0)
MCH: 18.2 pg — ABNORMAL LOW (ref 26.0–34.0)
MCHC: 29 g/dL — AB (ref 30.0–36.0)
MCV: 62.9 fL — AB (ref 78.0–100.0)
Platelets: 285 10*3/uL (ref 150–400)
RBC: 5.1 MIL/uL (ref 3.87–5.11)
RDW: 17.6 % — ABNORMAL HIGH (ref 11.5–15.5)
WBC: 6.8 10*3/uL (ref 4.0–10.5)

## 2015-01-06 LAB — POCT URINE PREGNANCY: Preg Test, Ur: NEGATIVE

## 2015-01-06 LAB — TSH: TSH: 0.557 u[IU]/mL (ref 0.350–4.500)

## 2015-01-06 MED ORDER — VALACYCLOVIR HCL 500 MG PO TABS: 500.0000 mg | ORAL_TABLET | Freq: Two times a day (BID) | ORAL | Status: DC

## 2015-01-06 NOTE — Assessment & Plan Note (Addendum)
Long hx of menorrhagia. Pt requesting mirena IUD placement today. Pt is quite well-appearing and in no distress whatsoever, so no concern for acute severe blood loss. Pelvic exam deferred today as unlikely to change management given known hx of menorrhagia responding to IUD. Advised pt will need separate visit for IUD placement. Urine pregnancy test negative.  -labs: CBC, TSH -[facility] & pap smear (noted pt is due)

## 2015-01-06 NOTE — Assessment & Plan Note (Signed)
Refilled valtrex for prn use  

## 2015-01-06 NOTE — Assessment & Plan Note (Signed)
BP elevated initially at 146/90, then again at 132/98. Recommend pt have BP rechecked when she comes for IUD placement.

## 2015-01-06 NOTE — Progress Notes (Signed)
Patient ID: Natasha Smith, female   DOB: 09-27-1990, 24 y.o.   MRN: [id number]  HPI:  Pt presents for a same day appointment to discuss menorrhagia.  Has hx of heavy bleeding in the past, improved previously with mirena. Endorses long hx of menorrhagia, has been going on for some time. States she bleeds heavy enough to "soak a pad in 15 minutes". Endorses mild shortness of breath with her periods but no significant weakness. Delivered baby appx 7 months ago. Not using anything for birth control presently. Sexually active with one female partner in the past year. Does not smoke but does endorse a hx of migraine with aura. Has cramping with periods but no pelvic pain outside of menstruation. Currently on period.  Also requests refill of valtrex. Hx of genital herpes. Has outbreaks about once per year, takes valtrex just during outbreaks. No current sores, just wants refill on hand in case she needs it.  ROS: See HPI  PMFSH: hx eczema, genital HSV, menorrhagia  PHYSICAL EXAM: BP 132/98 mmHg  Pulse 73  Temp(Src) 98 F (36.7 C) (Oral)  Wt 179 lb (81.194 kg) Gen: NAD, awake and alert, well appearing, bright HEENT: NCAT, thyroid without nodules. Mild pallor of conjunctiva. Mucous membranes moist. Heart: RRR no murmur Lungs: CTAB NWOB, speaks in full sentences without distress Abdomen: soft, NTTP, no masses or organomegaly, no peritoneal signs Neuro: grossly nonfocal, speech normal.  ASSESSMENT/PLAN:  Menorrhagia Long hx of menorrhagia. Pt requesting mirena IUD placement today. Pt is quite well-appearing and in no distress whatsoever, so no concern for acute severe blood loss. Pelvic exam deferred today as unlikely to change management given known hx of menorrhagia responding to IUD. Advised pt will need separate visit for IUD placement. Urine pregnancy test negative.  -labs: CBC, TSH -[facility] & pap smear (noted pt is due)  HSV-2 (herpes  simplex virus 2) infection Refilled valtrex for prn use.  Elevated BP BP elevated initially at 146/90, then again at 132/98. Recommend pt have BP rechecked when she comes for IUD placement.    FOLLOW UP: F/u in several weeks for IUD placement in women's health clinic  [place]adaBrittany J. Pollie MeyerMcIntyre, MD [facility]

## 2015-01-06 NOTE — Patient Instructions (Signed)
Checking some labs today: blood counts, thyroid Schedule separate visit to get Mirena placed (women's health procedures clinic) Refilled valtrex  Be well, Dr. Pollie MeyerMcIntyre

## 2015-01-08 ENCOUNTER — Encounter: Payer: Self-pay | Admitting: Family Medicine

## 2015-01-22 ENCOUNTER — Ambulatory Visit: Payer: Medicaid Other

## 2015-01-29 ENCOUNTER — Emergency Department (HOSPITAL_COMMUNITY)
Admission: EM | Admit: 2015-01-29 | Discharge: 2015-01-29 | Disposition: A | Discharge status: Home / Self Care | Payer: Medicaid Other | Attending: Emergency Medicine | Admitting: Emergency Medicine

## 2015-01-29 ENCOUNTER — Encounter (HOSPITAL_COMMUNITY): Payer: Self-pay | Admitting: *Deleted

## 2015-01-29 DIAGNOSIS — Z87891 Personal history of nicotine dependence: Secondary | ICD-10-CM | POA: Diagnosis not present

## 2015-01-29 DIAGNOSIS — Z79899 Other long term (current) drug therapy: Secondary | ICD-10-CM | POA: Diagnosis not present

## 2015-01-29 DIAGNOSIS — M545 Low back pain, unspecified: Secondary | ICD-10-CM

## 2015-01-29 DIAGNOSIS — Z8619 Personal history of other infectious and parasitic diseases: Secondary | ICD-10-CM | POA: Insufficient documentation

## 2015-01-29 DIAGNOSIS — Z862 Personal history of diseases of the blood and blood-forming organs and certain disorders involving the immune mechanism: Secondary | ICD-10-CM | POA: Insufficient documentation

## 2015-01-29 DIAGNOSIS — Z87440 Personal history of urinary (tract) infections: Secondary | ICD-10-CM | POA: Diagnosis not present

## 2015-01-29 MED ORDER — IBUPROFEN 800 MG PO TABS: 800.0000 mg | ORAL_TABLET | Freq: Three times a day (TID) | ORAL | Status: DC

## 2015-01-29 MED ORDER — CYCLOBENZAPRINE HCL 10 MG PO TABS: 10.0000 mg | ORAL_TABLET | Freq: Two times a day (BID) | ORAL | Status: DC | PRN

## 2015-01-29 NOTE — Discharge Instructions (Signed)
Back Pain, Adult °Back pain is very common. The pain often gets better over time. The cause of back pain is usually not dangerous. Most people can learn to manage their back pain on their own.  °HOME CARE  °· Stay active. Start with short walks on flat ground if you can. Try to walk farther each day. °· Do not sit, drive, or stand in one place for more than 30 minutes. Do not stay in bed. °· Do not avoid exercise or work. Activity can help your back heal faster. °· Be careful when you bend or lift an object. Bend at your knees, keep the object close to you, and do not twist. °· Sleep on a firm mattress. Lie on your side, and bend your knees. If you lie on your back, put a pillow under your knees. °· Only take medicines as told by your doctor. °· Put ice on the injured area. °¨ Put ice in a plastic bag. °¨ Place a towel between your skin and the bag. °¨ Leave the ice on for 15-20 minutes, 03-04 times a day for the first 2 to 3 days. After that, you can switch between ice and heat packs. °· Ask your doctor about back exercises or massage. °· Avoid feeling anxious or stressed. Find good ways to deal with stress, such as exercise. °GET HELP RIGHT AWAY IF:  °· Your pain does not go away with rest or medicine. °· Your pain does not go away in 1 week. °· You have new problems. °· You do not feel well. °· The pain spreads into your legs. °· You cannot control when you poop (bowel movement) or pee (urinate). °· Your arms or legs feel weak or lose feeling (numbness). °· You feel sick to your stomach (nauseous) or throw up (vomit). °· You have belly (abdominal) pain. °· You feel like you may pass out (faint). °MAKE SURE YOU:  °· Understand these instructions. °· Will watch your condition. °· Will get help right away if you are not doing well or get worse. °Document Released: 12/14/2007 Document Revised: 09/19/2011 Document Reviewed: 10/29/2013 °ExitCare® Patient Information ©2015 [employer], LLC. This information is not intended  to replace advice given to you by your health care provider. Make sure you discuss any questions you have with your health care provider. ° °

## 2015-01-29 NOTE — ED Notes (Signed)
Pt c/o lower back pain since Tuesday. Pt states she was sitting when onset of pain started. Nothing makes her feel better, lying down and bending over increases pain. Pt denies any recent injury, fall, or car accident.

## 2015-01-29 NOTE — ED Notes (Signed)
Called for triage x1.

## 2015-01-29 NOTE — ED Provider Notes (Signed)
History  This chart was scribed for non-physician practitioner, Teressa Lower, FNP,working with Elwin Mocha, MD, by Karle Plumber, ED Scribe. This patient was seen in room TR07C/TR07C and the patient's care was started at 9:36 PM.  Chief Complaint  Patient presents with  . Back Pain   The history is provided by the patient and medical records. No language interpreter was used.    HPI Comments:  Natasha Smith is a 24 y.o. female who presents to the Emergency Department complaining of severe lower back pain that began two days ago. She states she has been taking Ibuprofen and Tylenol with no significant relief of the pain. Touching the area and movements make the pain worse. She denies alleviating factors. She denies fever, chills, nausea, vomiting, numbness, tingling or weakness of the lower extremities, trauma, injury, fall,incontinence, bruising or wounds.  Past Medical History  Diagnosis Date  . Sickle cell trait   . Abnormal Pap smear   . History of chlamydia infection   . Pregnancy induced hypertension     hx  . Herpes simplex type II infection   . Headache   . Infection     UTI   Past Surgical History  Procedure Laterality Date  . Knee surgery     Family History  Problem Relation Age of Onset  . Diabetes Maternal Grandmother   . Hypertension Mother   . Asthma Mother   . Heart disease Maternal Grandfather   . Asthma Sister   . Asthma Brother   . Cancer Maternal Aunt    History  Substance Use Topics  . Smoking status: Former Games developer  . Smokeless tobacco: Never Used     Comment: March 2015  . Alcohol Use: No   OB History    Gravida Para Term Preterm AB TAB SAB Ectopic Multiple Living   0 0 0 3     Review of Systems  Constitutional: Negative for fever and chills.  Gastrointestinal: Negative for nausea and vomiting.  Musculoskeletal: Positive for back pain.  Skin: Negative for color change and wound.  All other systems reviewed and are  negative.   Allergies  Review of patient's allergies indicates no known allergies.  Home Medications   Prior to Admission medications   Medication Sig Start Date End Date Taking? Authorizing Provider  docusate sodium (COLACE) 100 MG capsule Take 1 capsule (100 mg total) by mouth 2 (two) times daily. For 5 days then as needed. 06/14/14   Gerrit Heck, CNM  ferrous sulfate 325 (65 FE) MG tablet Take 1 tablet (325 mg total) by mouth 2 (two) times daily. 06/14/14   Gerrit Heck, CNM  ibuprofen (ADVIL,MOTRIN) 600 MG tablet Take 1 tablet (600 mg total) by mouth every 6 (six) hours. 06/14/14   Gerrit Heck, CNM  Prenatal Vit-Fe Fumarate-FA (PRENATAL MULTIVITAMIN) TABS tablet Take 1 tablet by mouth daily at 12 noon.    Historical Provider, MD  valACYclovir (VALTREX) 500 MG tablet Take 1 tablet (500 mg total) by mouth 2 (two) times daily. For 3 days at first sign of outbreak. 01/06/15   Latrelle Dodrill, MD   Triage Vitals: BP 122/108 mmHg  Pulse 106  Temp(Src) 98.5 F (36.9 C) (Oral)  Resp 18  SpO2 99%  LMP 12/18/2014 (Approximate) Physical Exam  Constitutional: She is oriented to person, place, and time. She appears well-developed and well-nourished.  HENT:  Head: Normocephalic and atraumatic.  Cardiovascular: Normal rate and regular rhythm.   Pulmonary/Chest: Effort  normal and breath sounds normal.  Abdominal: Soft. Bowel sounds are normal. There is no tenderness.  Musculoskeletal:  Lumbar spine and paraspinal tenderness. Good sensation and legs to bilateral lower extremities. Able to do bilateral straight leg raises  Neurological: She is alert and oriented to person, place, and time. She exhibits normal muscle tone. Coordination normal.  Skin: Skin is warm and dry.  Psychiatric: She has a normal mood and affect.  Nursing note and vitals reviewed.   ED Course  Procedures (including critical care time) DIAGNOSTIC STUDIES: Oxygen Saturation is 99% on RA, normal by my interpretation.    COORDINATION OF CARE: 9:39 PM- Will prescribe pain medication. Pt verbalizes understanding and agrees to plan.  Medications - No data to display  Labs Review Labs Reviewed - No data to display  Imaging Review No results found.   EKG Interpretation None      MDM   Final diagnoses:  Bilateral low back pain without sciatica    No red flags. Pt given flexeril and ibuprofen and ortho follow up   I personally performed the services described in this documentation, which was scribed in my presence. The recorded information has been reviewed and is accurate.    Teressa Lower, NP 01/29/15 1610  Elwin Mocha, MD 01/29/15 [phone]

## 2015-01-30 ENCOUNTER — Ambulatory Visit: Payer: Medicaid Other | Admitting: Family Medicine

## 2015-02-25 ENCOUNTER — Inpatient Hospital Stay (HOSPITAL_COMMUNITY)
Admission: AD | Admit: 2015-02-25 | Discharge: 2015-02-25 | Disposition: A | Discharge status: Home / Self Care | Payer: Medicaid Other | Source: Ambulatory Visit | Attending: Obstetrics and Gynecology | Admitting: Obstetrics and Gynecology

## 2015-02-25 ENCOUNTER — Encounter (HOSPITAL_COMMUNITY): Payer: Self-pay

## 2015-02-25 DIAGNOSIS — N390 Urinary tract infection, site not specified: Secondary | ICD-10-CM | POA: Diagnosis not present

## 2015-02-25 DIAGNOSIS — N926 Irregular menstruation, unspecified: Secondary | ICD-10-CM

## 2015-02-25 DIAGNOSIS — Z87891 Personal history of nicotine dependence: Secondary | ICD-10-CM | POA: Diagnosis not present

## 2015-02-25 DIAGNOSIS — N939 Abnormal uterine and vaginal bleeding, unspecified: Secondary | ICD-10-CM | POA: Diagnosis present

## 2015-02-25 LAB — CBC
HEMATOCRIT: 29.7 % — AB (ref 36.0–46.0)
HEMOGLOBIN: 9 g/dL — AB (ref 12.0–15.0)
MCH: 18.6 pg — AB (ref 26.0–34.0)
MCHC: 30.3 g/dL (ref 30.0–36.0)
MCV: 61.5 fL — ABNORMAL LOW (ref 78.0–100.0)
Platelets: 240 10*3/uL (ref 150–400)
RBC: 4.83 MIL/uL (ref 3.87–5.11)
RDW: 18.1 % — ABNORMAL HIGH (ref 11.5–15.5)
WBC: 9.2 10*3/uL (ref 4.0–10.5)

## 2015-02-25 LAB — WET PREP, GENITAL
Trich, Wet Prep: NONE SEEN
Yeast Wet Prep HPF POC: NONE SEEN

## 2015-02-25 LAB — POCT PREGNANCY, URINE: Preg Test, Ur: NEGATIVE

## 2015-02-25 LAB — URINE MICROSCOPIC-ADD ON

## 2015-02-25 LAB — HCG, QUANTITATIVE, PREGNANCY

## 2015-02-25 LAB — URINALYSIS, ROUTINE W REFLEX MICROSCOPIC
Bilirubin Urine: NEGATIVE
Glucose, UA: NEGATIVE mg/dL
Ketones, ur: 15 mg/dL — AB
Nitrite: POSITIVE — AB
Protein, ur: >300 mg/dL — AB
SPECIFIC GRAVITY, URINE: 1.02 (ref 1.005–1.030)
Urobilinogen, UA: 4 mg/dL — ABNORMAL HIGH (ref 0.0–1.0)
pH: 6.5 (ref 5.0–8.0)

## 2015-02-25 MED ORDER — CIPROFLOXACIN HCL 500 MG PO TABS: 500.0000 mg | ORAL_TABLET | Freq: Two times a day (BID) | ORAL | Status: DC

## 2015-02-25 NOTE — MAU Note (Signed)
Positive UPT about a week ago, started having vaginal bleeding yesterday and cramping, nausea at night.

## 2015-02-25 NOTE — Discharge Instructions (Signed)

## 2015-02-25 NOTE — MAU Provider Note (Signed)
History     CSN: [id number]  Arrival date and time: 02/25/15 2048   First Provider Initiated Contact with Patient 02/25/15 2123      Chief Complaint  Patient presents with  . Vaginal Bleeding  . Abdominal Pain  . Nausea   HPI Comments: Natasha Smith is a 24 y.o. [phone] who presents today with vaginal bleeding. She states that she had a period on 01/20/15. She had a +UPT at home last week. She states that bleeding started today.   Vaginal Bleeding The patient's primary symptoms include vaginal bleeding. This is a new problem. The current episode started yesterday. The problem occurs constantly. The problem has been unchanged. The pain is moderate (7/10, lower abdomen and back cramping. ). The problem affects both sides. She is not pregnant (patient states that she had a +UPT at home. ). Associated symptoms include abdominal pain, nausea and vomiting. Pertinent negatives include no constipation, diarrhea, dysuria, fever, frequency or urgency. The vaginal bleeding is lighter than menses. She has been passing clots (about ping pong ball size. ). She has not been passing tissue. She is sexually active.    Past Medical History  Diagnosis Date  . Sickle cell trait   . Abnormal Pap smear   . History of chlamydia infection   . Pregnancy induced hypertension     hx  . Herpes simplex type II infection   . Headache   . Infection     UTI    Past Surgical History  Procedure Laterality Date  . Knee surgery      Family History  Problem Relation Age of Onset  . Diabetes Maternal Grandmother   . Hypertension Mother   . Asthma Mother   . Heart disease Maternal Grandfather   . Asthma Sister   . Asthma Brother   . Cancer Maternal Aunt     Social History  Substance Use Topics  . Smoking status: Former Games developer  . Smokeless tobacco: Never Used     Comment: March 2015  . Alcohol Use: No    Allergies: No Known Allergies  Prescriptions prior to admission  Medication Sig Dispense  Refill Last Dose  . ibuprofen (ADVIL,MOTRIN) 800 MG tablet Take 1 tablet (800 mg total) by mouth 3 (three) times daily. 21 tablet 0 02/24/2015 at Unknown time  . cyclobenzaprine (FLEXERIL) 10 MG tablet Take 1 tablet (10 mg total) by mouth 2 (two) times daily as needed for muscle spasms. (Patient not taking: Reported on 02/25/2015) 20 tablet 0 Not Taking at Unknown time  . docusate sodium (COLACE) 100 MG capsule Take 1 capsule (100 mg total) by mouth 2 (two) times daily. For 5 days then as needed. (Patient not taking: Reported on 02/25/2015) 30 capsule 0 Not Taking at Unknown time  . ferrous sulfate 325 (65 FE) MG tablet Take 1 tablet (325 mg total) by mouth 2 (two) times daily. (Patient not taking: Reported on 02/25/2015) 60 tablet 3   . ibuprofen (ADVIL,MOTRIN) 600 MG tablet Take 1 tablet (600 mg total) by mouth every 6 (six) hours. (Patient not taking: Reported on 02/25/2015) 30 tablet 2   . valACYclovir (VALTREX) 500 MG tablet Take 1 tablet (500 mg total) by mouth 2 (two) times daily. For 3 days at first sign of outbreak. (Patient not taking: Reported on 02/25/2015) 6 tablet 2     Review of Systems  Constitutional: Negative for fever.  Gastrointestinal: Positive for nausea, vomiting and abdominal pain. Negative for diarrhea and constipation.  Genitourinary: Positive for  vaginal bleeding. Negative for dysuria, urgency and frequency.   Physical Exam   Blood pressure 145/91, pulse 72, temperature 98.2 F (36.8 C), temperature source Oral, resp. rate 18, height 5\' 4"  (1.626 m), weight 80.74 kg (178 lb), last menstrual period 01/20/2015, unknown if currently breastfeeding.  Physical Exam  Vitals reviewed. Constitutional: She is oriented to person, place, and time. She appears well-developed and well-nourished. No distress.  HENT:  Head: Normocephalic.  Cardiovascular: Normal rate.   Respiratory: Effort normal.  GI: Soft. There is no tenderness. There is no rebound.  Genitourinary:   External: no  lesion Vagina: small amount of blood seen  Cervix: pink, smooth, no CMT Uterus: NSSC Adnexa: NT   Neurological: She is alert and oriented to person, place, and time.  Skin: Skin is warm.  Psychiatric: She has a normal mood and affect.   Results for orders placed or performed during the hospital encounter of 02/25/15 (from the past 24 hour(s))  Urinalysis, Routine w reflex microscopic (not at [facility])     Status: Abnormal   Collection Time: 02/25/15  8:52 PM  Result Value Ref Range   Color, Urine RED (A) YELLOW   APPearance TURBID (A) CLEAR   Specific Gravity, Urine 1.020 1.005 - 1.030   pH 6.5 5.0 - 8.0   Glucose, UA NEGATIVE NEGATIVE mg/dL   Hgb urine dipstick LARGE (A) NEGATIVE   Bilirubin Urine NEGATIVE NEGATIVE   Ketones, ur 15 (A) NEGATIVE mg/dL   Protein, ur >161 (A) NEGATIVE mg/dL   Urobilinogen, UA 4.0 (H) 0.0 - 1.0 mg/dL   Nitrite POSITIVE (A) NEGATIVE   Leukocytes, UA MODERATE (A) NEGATIVE  Urine microscopic-add on     Status: Abnormal   Collection Time: 02/25/15  8:52 PM  Result Value Ref Range   Squamous Epithelial / LPF FEW (A) RARE   WBC, UA 11-20 <3 WBC/hpf   RBC / HPF TOO NUMEROUS TO COUNT <3 RBC/hpf   Bacteria, UA MANY (A) RARE  Pregnancy, urine POC     Status: None   Collection Time: 02/25/15  8:57 PM  Result Value Ref Range   Preg Test, Ur NEGATIVE NEGATIVE  CBC     Status: Abnormal   Collection Time: 02/25/15  9:14 PM  Result Value Ref Range   WBC 9.2 4.0 - 10.5 K/uL   RBC 4.83 3.87 - 5.11 MIL/uL   Hemoglobin 9.0 (L) 12.0 - 15.0 g/dL   HCT 09.6 (L) 04.5 - 40.9 %   MCV 61.5 (L) 78.0 - 100.0 fL   MCH 18.6 (L) 26.0 - 34.0 pg   MCHC 30.3 30.0 - 36.0 g/dL   RDW 81.1 (H) 91.4 - 78.2 %   Platelets 240 150 - 400 K/uL  hCG, quantitative, pregnancy     Status: None   Collection Time: 02/25/15  9:14 PM  Result Value Ref Range   hCG, Beta Chain, Quant, S <1 <5 mIU/mL  Wet prep, genital     Status: Abnormal   Collection Time: 02/25/15  9:30 PM  Result Value  Ref Range   Yeast Wet Prep HPF POC NONE SEEN NONE SEEN   Trich, Wet Prep NONE SEEN NONE SEEN   Clue Cells Wet Prep HPF POC FEW (A) NONE SEEN   WBC, Wet Prep HPF POC FEW (A) NONE SEEN    MAU Course  Procedures  MDM  Assessment and Plan   1. Irregular menstrual cycle   2. UTI (lower urinary tract infection)    DC home Comfort  measures reviewed  RX: cipro #10  Return to MAU as needed   Follow-up Information    [facility]   Why:  If symptoms worsen   Contact information:   [address] [place] [address]         Tawnya Crook 02/25/2015, 9:25 PM

## 2015-02-26 LAB — GC/CHLAMYDIA PROBE AMP (~~LOC~~) NOT AT ARMC
CHLAMYDIA, DNA PROBE: NEGATIVE
Neisseria Gonorrhea: NEGATIVE

## 2015-02-26 LAB — HIV ANTIBODY (ROUTINE TESTING W REFLEX): HIV SCREEN 4TH GENERATION: NONREACTIVE

## 2015-06-01 ENCOUNTER — Encounter (HOSPITAL_COMMUNITY): Payer: Self-pay

## 2015-06-01 ENCOUNTER — Emergency Department (HOSPITAL_COMMUNITY)
Admission: EM | Admit: 2015-06-01 | Discharge: 2015-06-01 | Disposition: A | Discharge status: Home / Self Care | Payer: Medicaid Other | Attending: Emergency Medicine | Admitting: Emergency Medicine

## 2015-06-01 DIAGNOSIS — Z791 Long term (current) use of non-steroidal anti-inflammatories (NSAID): Secondary | ICD-10-CM | POA: Insufficient documentation

## 2015-06-01 DIAGNOSIS — O210 Mild hyperemesis gravidarum: Secondary | ICD-10-CM | POA: Insufficient documentation

## 2015-06-01 DIAGNOSIS — Z87891 Personal history of nicotine dependence: Secondary | ICD-10-CM | POA: Insufficient documentation

## 2015-06-01 DIAGNOSIS — Z87440 Personal history of urinary (tract) infections: Secondary | ICD-10-CM | POA: Diagnosis not present

## 2015-06-01 DIAGNOSIS — Z3A01 Less than 8 weeks gestation of pregnancy: Secondary | ICD-10-CM | POA: Insufficient documentation

## 2015-06-01 DIAGNOSIS — Z862 Personal history of diseases of the blood and blood-forming organs and certain disorders involving the immune mechanism: Secondary | ICD-10-CM | POA: Diagnosis not present

## 2015-06-01 DIAGNOSIS — R110 Nausea: Secondary | ICD-10-CM

## 2015-06-01 DIAGNOSIS — R197 Diarrhea, unspecified: Secondary | ICD-10-CM | POA: Insufficient documentation

## 2015-06-01 DIAGNOSIS — Z8619 Personal history of other infectious and parasitic diseases: Secondary | ICD-10-CM | POA: Insufficient documentation

## 2015-06-01 DIAGNOSIS — O9989 Other specified diseases and conditions complicating pregnancy, childbirth and the puerperium: Secondary | ICD-10-CM | POA: Diagnosis not present

## 2015-06-01 DIAGNOSIS — Z3490 Encounter for supervision of normal pregnancy, unspecified, unspecified trimester: Secondary | ICD-10-CM

## 2015-06-01 LAB — I-STAT CHEM 8, ED
BUN: 5 mg/dL — ABNORMAL LOW (ref 6–20)
CALCIUM ION: 1.2 mmol/L (ref 1.12–1.23)
Chloride: 103 mmol/L (ref 101–111)
Creatinine, Ser: 0.6 mg/dL (ref 0.44–1.00)
Glucose, Bld: 86 mg/dL (ref 65–99)
HCT: 37 % (ref 36.0–46.0)
HEMOGLOBIN: 12.6 g/dL (ref 12.0–15.0)
POTASSIUM: 3.3 mmol/L — AB (ref 3.5–5.1)
Sodium: 138 mmol/L (ref 135–145)
TCO2: 20 mmol/L (ref 0–100)

## 2015-06-01 LAB — I-STAT BETA HCG BLOOD, ED (MC, WL, AP ONLY)

## 2015-06-01 MED ORDER — SODIUM CHLORIDE 0.9 % IV BOLUS (SEPSIS)
1000.0000 mL | Freq: Once | INTRAVENOUS | Status: AC
  Administered 2015-06-01: 1000 mL via INTRAVENOUS

## 2015-06-01 MED ORDER — ONDANSETRON 8 MG PO TBDP: 8.0000 mg | ORAL_TABLET | Freq: Three times a day (TID) | ORAL | Status: DC | PRN

## 2015-06-01 MED ORDER — ONDANSETRON HCL 4 MG/2ML IJ SOLN
4.0000 mg | Freq: Once | INTRAMUSCULAR | Status: AC
  Administered 2015-06-01: 4 mg via INTRAVENOUS
  Filled 2015-06-01: qty 2

## 2015-06-01 NOTE — ED Notes (Signed)
Per EMS, Pt, from home, c/o n/v/d x 3 days.  Denies pain.  Pt reports family members have been sick.  Also, Pt found out x 3 days ago that she is pregnant.

## 2015-06-01 NOTE — ED Provider Notes (Signed)
CSN: [id number]     Arrival date & time 06/01/15  1047 History   First MD Initiated Contact with Patient 06/01/15 1112     Chief Complaint  Patient presents with  . Emesis  . Diarrhea      HPI  Patient presents with a positive pregnancy at home now making her a [id number].  She presents with nausea vomiting diarrhea 3 days and feeling generally weak.  She denies vaginal bleeding.  She denies lower abdominal pain.  She reports multiple family members with similar symptoms.  Denies fevers and chills.  No syncope.  Mild decreased oral intake.  Symptoms are moderate in severity     Past Medical History  Diagnosis Date  . Sickle cell trait (HCC)   . Abnormal Pap smear   . History of chlamydia infection   . Pregnancy induced hypertension     hx  . Herpes simplex type II infection   . Headache   . Infection     UTI   Past Surgical History  Procedure Laterality Date  . Knee surgery     Family History  Problem Relation Age of Onset  . Diabetes Maternal Grandmother   . Hypertension Mother   . Asthma Mother   . Heart disease Maternal Grandfather   . Asthma Sister   . Asthma Brother   . Cancer Maternal Aunt    Social History  Substance Use Topics  . Smoking status: Former Games developermoker  . Smokeless tobacco: Never Used     Comment: March 2015  . Alcohol Use: No   OB History    Gravida Para Term Preterm AB TAB SAB Ectopic Multiple Living   5 3 3  0 2 1 1  0 0 3     Review of Systems All systems reviewed and negative except as per history of present illness   Allergies  Review of patient's allergies indicates no known allergies.  Home Medications   Prior to Admission medications   Medication Sig Start Date End Date Taking? Authorizing Provider  ibuprofen (ADVIL,MOTRIN) 800 MG tablet Take 1 tablet (800 mg total) by mouth 3 (three) times daily. 01/29/15  Yes Teressa LowerVrinda Pickering, NP  ciprofloxacin (CIPRO) 500 MG tablet Take 1 tablet (500 mg total) by mouth 2 (two) times  daily. Patient not taking: Reported on 06/01/2015 02/25/15   Armando ReichertHeather D Hogan, CNM  cyclobenzaprine (FLEXERIL) 10 MG tablet Take 1 tablet (10 mg total) by mouth 2 (two) times daily as needed for muscle spasms. Patient not taking: Reported on 02/25/2015 01/29/15   Teressa LowerVrinda Pickering, NP   BP 156/82 mmHg  Pulse 60  Temp(Src) 97.5 F (36.4 C) (Oral)  Resp 18  SpO2 100%  LMP 05/23/2015 Physical Exam  Constitutional: She is oriented to person, place, and time. She appears well-developed and well-nourished. No distress.  HENT:  Head: Normocephalic and atraumatic.  Eyes: EOM are normal.  Neck: Normal range of motion.  Cardiovascular: Normal rate, regular rhythm and normal heart sounds.   Pulmonary/Chest: Effort normal and breath sounds normal.  Abdominal: Soft. She exhibits no distension. There is no tenderness.  Musculoskeletal: Normal range of motion.  Neurological: She is alert and oriented to person, place, and time.  Skin: Skin is warm and dry.  Psychiatric: She has a normal mood and affect. Judgment normal.  Nursing note and vitals reviewed.   ED Course  Procedures (including critical care time) Labs Review Labs Reviewed  I-STAT CHEM 8, ED - Abnormal; Notable for the following:  Potassium 3.3 (*)    BUN 5 (*)    All other components within normal limits  I-STAT BETA HCG BLOOD, ED ([facility], [facility], [facility] ONLY) - Abnormal; Notable for the following:    I-stat hCG, quantitative >2000.0 (*)    All other components within normal limits    Imaging Review No results found. I have personally reviewed and evaluated these images and lab results as part of my medical decision-making.   EKG Interpretation None      MDM   Final diagnoses:  None    Positive pregnancy test. No indication for US imaging today. Ob follow up. Ectopic precautions given. Home with nausea meds    Azalia Bilis, MD 06/01/15 1400

## 2015-06-01 NOTE — Discharge Instructions (Signed)
First Trimester of Pregnancy The first trimester of pregnancy is from week 1 until the end of week 12 (months 1 through 3). A week after a sperm fertilizes an egg, the egg will implant on the wall of the uterus. This embryo will begin to develop into a baby. Genes from you and your partner are forming the baby. The female genes determine whether the baby is a boy or a girl. At 6-8 weeks, the eyes and face are formed, and the heartbeat can be seen on ultrasound. At the end of 12 weeks, all the baby's organs are formed.  Now that you are pregnant, you will want to do everything you can to have a healthy baby. Two of the most important things are to get good prenatal care and to follow your health care provider's instructions. Prenatal care is all the medical care you receive before the baby's birth. This care will help prevent, find, and treat any problems during the pregnancy and childbirth. BODY CHANGES Your body goes through many changes during pregnancy. The changes vary from woman to woman.   You may gain or lose a couple of pounds at first.  You may feel sick to your stomach (nauseous) and throw up (vomit). If the vomiting is uncontrollable, call your health care provider.  You may tire easily.  You may develop headaches that can be relieved by medicines approved by your health care provider.  You may urinate more often. Painful urination may mean you have a bladder infection.  You may develop heartburn as a result of your pregnancy.  You may develop constipation because certain hormones are causing the muscles that push waste through your intestines to slow down.  You may develop hemorrhoids or swollen, bulging veins (varicose veins).  Your breasts may begin to grow larger and become tender. Your nipples may stick out more, and the tissue that surrounds them (areola) may become darker.  Your gums may bleed and may be sensitive to brushing and flossing.  Dark spots or blotches (chloasma,  mask of pregnancy) may develop on your face. This will likely fade after the baby is born.  Your menstrual periods will stop.  You may have a loss of appetite.  You may develop cravings for certain kinds of food.  You may have changes in your emotions from day to day, such as being excited to be pregnant or being concerned that something may go wrong with the pregnancy and baby.  You may have more vivid and strange dreams.  You may have changes in your hair. These can include thickening of your hair, rapid growth, and changes in texture. Some women also have hair loss during or after pregnancy, or hair that feels dry or thin. Your hair will most likely return to normal after your baby is born. WHAT TO EXPECT AT YOUR PRENATAL VISITS During a routine prenatal visit:  You will be weighed to make sure you and the baby are growing normally.  Your blood pressure will be taken.  Your abdomen will be measured to track your baby's growth.  The fetal heartbeat will be listened to starting around week 10 or 12 of your pregnancy.  Test results from any previous visits will be discussed. Your health care provider may ask you:  How you are feeling.  If you are feeling the baby move.  If you have had any abnormal symptoms, such as leaking fluid, bleeding, severe headaches, or abdominal cramping.  If you are using any tobacco products,   including cigarettes, chewing tobacco, and electronic cigarettes.  If you have any questions. Other tests that may be performed during your first trimester include:  Blood tests to find your blood type and to check for the presence of any previous infections. They will also be used to check for low iron levels (anemia) and Rh antibodies. Later in the pregnancy, blood tests for diabetes will be done along with other tests if problems develop.  Urine tests to check for infections, diabetes, or protein in the urine.  An ultrasound to confirm the proper growth  and development of the baby.  An amniocentesis to check for possible genetic problems.  Fetal screens for spina bifida and Down syndrome.  You may need other tests to make sure you and the baby are doing well.  HIV (human immunodeficiency virus) testing. Routine prenatal testing includes screening for HIV, unless you choose not to have this test. HOME CARE INSTRUCTIONS  Medicines  Follow your health care provider's instructions regarding medicine use. Specific medicines may be either safe or unsafe to take during pregnancy.  Take your prenatal vitamins as directed.  If you develop constipation, try taking a stool softener if your health care provider approves. Diet  Eat regular, well-balanced meals. Choose a variety of foods, such as meat or vegetable-based protein, fish, milk and low-fat dairy products, vegetables, fruits, and whole grain breads and cereals. Your health care provider will help you determine the amount of weight gain that is right for you.  Avoid raw meat and uncooked cheese. These carry germs that can cause birth defects in the baby.  Eating four or five small meals rather than three large meals a day may help relieve nausea and vomiting. If you start to feel nauseous, eating a few soda crackers can be helpful. Drinking liquids between meals instead of during meals also seems to help nausea and vomiting.  If you develop constipation, eat more high-fiber foods, such as fresh vegetables or fruit and whole grains. Drink enough fluids to keep your urine clear or pale yellow. Activity and Exercise  Exercise only as directed by your health care provider. Exercising will help you:  Control your weight.  Stay in shape.  Be prepared for labor and delivery.  Experiencing pain or cramping in the lower abdomen or low back is a good sign that you should stop exercising. Check with your health care provider before continuing normal exercises.  Try to avoid standing for long  periods of time. Move your legs often if you must stand in one place for a long time.  Avoid heavy lifting.  Wear low-heeled shoes, and practice good posture.  You may continue to have sex unless your health care provider directs you otherwise. Relief of Pain or Discomfort  Wear a good support bra for breast tenderness.   Take warm sitz baths to soothe any pain or discomfort caused by hemorrhoids. Use hemorrhoid cream if your health care provider approves.   Rest with your legs elevated if you have leg cramps or low back pain.  If you develop varicose veins in your legs, wear support hose. Elevate your feet for 15 minutes, 3-4 times a day. Limit salt in your diet. Prenatal Care  Schedule your prenatal visits by the twelfth week of pregnancy. They are usually scheduled monthly at first, then more often in the last 2 months before delivery.  Write down your questions. Take them to your prenatal visits.  Keep all your prenatal visits as directed by your   health care provider. Safety  Wear your seat belt at all times when driving.  Make a list of emergency phone numbers, including numbers for family, friends, the hospital, and police and fire departments. General Tips  Ask your health care provider for a referral to a local prenatal education class. Begin classes no later than at the beginning of month 6 of your pregnancy.  Ask for help if you have counseling or nutritional needs during pregnancy. Your health care provider can offer advice or refer you to specialists for help with various needs.  Do not use hot tubs, steam rooms, or saunas.  Do not douche or use tampons or scented sanitary pads.  Do not cross your legs for long periods of time.  Avoid cat litter boxes and soil used by cats. These carry germs that can cause birth defects in the baby and possibly loss of the fetus by miscarriage or stillbirth.  Avoid all smoking, herbs, alcohol, and medicines not prescribed by  your health care provider. Chemicals in these affect the formation and growth of the baby.  Do not use any tobacco products, including cigarettes, chewing tobacco, and electronic cigarettes. If you need help quitting, ask your health care provider. You may receive counseling support and other resources to help you quit.  Schedule a dentist appointment. At home, brush your teeth with a soft toothbrush and be gentle when you floss. SEEK MEDICAL CARE IF:   You have dizziness.  You have mild pelvic cramps, pelvic pressure, or nagging pain in the abdominal area.  You have persistent nausea, vomiting, or diarrhea.  You have a bad smelling vaginal discharge.  You have pain with urination.  You notice increased swelling in your face, hands, legs, or ankles. SEEK IMMEDIATE MEDICAL CARE IF:   You have a fever.  You are leaking fluid from your vagina.  You have spotting or bleeding from your vagina.  You have severe abdominal cramping or pain.  You have rapid weight gain or loss.  You vomit blood or material that looks like coffee grounds.  You are exposed to [place] measles and have never had them.  You are exposed to fifth disease or chickenpox.  You develop a severe headache.  You have shortness of breath.  You have any kind of trauma, such as from a fall or a car accident.   This information is not intended to replace advice given to you by your health care provider. Make sure you discuss any questions you have with your health care provider.   Document Released: 06/21/2001 Document Revised: 07/18/2014 Document Reviewed: 05/07/2013 Elsevier Interactive Patient Education 2016 [employer]  

## 2015-06-01 NOTE — ED Notes (Signed)
Bed: WA02 Expected date:  Expected time:  Means of arrival:  Comments: 

## 2015-06-02 ENCOUNTER — Encounter: Payer: Self-pay | Admitting: Internal Medicine

## 2015-06-02 ENCOUNTER — Ambulatory Visit: Payer: Medicaid Other | Admitting: Internal Medicine

## 2015-07-12 NOTE — L&D Delivery Note (Signed)
Delivery Note At 3:03 PM a viable female was delivered via  (Presentation:cephalic).  APGAR: 8, 9; weight pending .   Placenta status: spont, intact.  Cord: 3V with the following complications: .  Cord pH: n/a  Anesthesia:  None Episiotomy:  None Lacerations:  None minor bilateral labial abrasions Suture Repair: n/a Est. Blood Loss (mL):  300cc  Mom to postpartum.  Baby to Couplet care / Skin to Skin.  Kiowa Hollar Y 04/11/2016, 3:20 PM

## 2015-08-25 ENCOUNTER — Encounter: Payer: Self-pay | Admitting: Family Medicine

## 2015-08-25 ENCOUNTER — Ambulatory Visit (INDEPENDENT_AMBULATORY_CARE_PROVIDER_SITE_OTHER): Payer: Medicaid Other | Admitting: Family Medicine

## 2015-08-25 VITALS — BP 140/77 | HR 85 | Temp 98.0°F | Wt 191.0 lb

## 2015-08-25 DIAGNOSIS — Z331 Pregnant state, incidental: Secondary | ICD-10-CM

## 2015-08-25 DIAGNOSIS — Z3491 Encounter for supervision of normal pregnancy, unspecified, first trimester: Secondary | ICD-10-CM | POA: Insufficient documentation

## 2015-08-25 DIAGNOSIS — Z3200 Encounter for pregnancy test, result unknown: Secondary | ICD-10-CM | POA: Diagnosis not present

## 2015-08-25 LAB — POCT URINE PREGNANCY: Preg Test, Ur: POSITIVE — AB

## 2015-08-25 MED ORDER — PRENATAL VITAMINS 28-0.8 MG PO TABS: 1.0000 | ORAL_TABLET | Freq: Every day | ORAL | Status: DC

## 2015-08-25 NOTE — Progress Notes (Signed)
Patient ID: Natasha Smith, female   DOB: [birth year], 25 y.o.   MRN: [id number]   Subjective:  This history was provided by the patient.  Natasha Smith is a 25 y.o. female who  has a past medical history of Sickle cell trait (HCC); Abnormal Pap smear; History of chlamydia infection; Pregnancy induced hypertension; Herpes simplex type II infection; Headache; and Infection.  Patient presents to clinic with 1 day of nausea and pelvic/lower abdominal cramping.  Patient also complains of fatigue for several weeks.  Patient has not treated nausea or pain.  Patient had a positive home pregnancy test yesterday.  Patient describes pain as intermittent and cramping in nature.  Patient denies vomiting, diarrhea, fever and vaginal discharge/bleeding.    Patient had a positive pregnancy test in November 2016 and terminated that pregnancy in December.  Patient is uncertain as to LMP and states she did not have one in December or January.    Review of Systems:  Per HPI. All other systems reviewed and are negative.   PMH, PSH, Medications, Allergies, and FmHx reviewed and updated in EMR.  Social History: former smoker  Objective:  BP 140/77 mmHg  Pulse 85  Temp(Src) 98 F (36.7 C) (Oral)  Wt 191 lb (86.637 kg)  General: 25 y.o. female Awake, alert, well nourished, NAD and non-toxic in appearance Cardio: RRR, S1S2 heard, no murmurs appreciated, no lower extremity edema, +2 radial pulses bilaterally Pulm: Clear to auscultation bilaterally, no wheezes, rhonchi or rales GI: Soft, not tender, no distension,+BS x4, no hepatomegaly, no splenomegaly MSK: Normal gait and station, no arthralgias  Assessment & Plan:  Natasha Smith is a 25 y.o. female here for nausea with no vomiting for one day and fatigue for several weeks. Patient also complains of pelvic and lower abdominal cramping that began yesterday.  Patient's urine pregnancy test in clinic today was positive.  Patient is uncertain as to whether or  not she wants to proceed with this pregnancy and states she asked about having a tubal ligation after last delivery, but the last OB wouldn't do it because of patient's age.  [id number] (including this pregnancy).    We are uncertain as to patient's LMP.  Will order an ultrasound to determine gestational age and placement, then proceed according to patient's wishes with pre-natal care.  1. Possible pregnancy, not yet confirmed - POCT urine pregnancy - US OB Comp Less 14 Wks; Future   Nelly Rout, NP [facility] 08/25/2015 10:08 AM

## 2015-08-25 NOTE — Patient Instructions (Signed)
Thank you for coming in,   Please schedule a pre-natal visit when you leave today.   Please avoid ibuprofen or advil.   You can use tylenol for pain.   Sign up for My Chart to have easy access to your labs results, and communication with your Primary care physician   Please feel free to call with any questions or concerns at any time, at [phone]. --Dr. Jordan Likes First Trimester of Pregnancy The first trimester of pregnancy is from week 1 until the end of week 12 (months 1 through 3). A week after a sperm fertilizes an egg, the egg will implant on the wall of the uterus. This embryo will begin to develop into a baby. Genes from you and your partner are forming the baby. The female genes determine whether the baby is a boy or a girl. At 6-8 weeks, the eyes and face are formed, and the heartbeat can be seen on ultrasound. At the end of 12 weeks, all the baby's organs are formed.  Now that you are pregnant, you will want to do everything you can to have a healthy baby. Two of the most important things are to get good prenatal care and to follow your health care provider's instructions. Prenatal care is all the medical care you receive before the baby's birth. This care will help prevent, find, and treat any problems during the pregnancy and childbirth. BODY CHANGES Your body goes through many changes during pregnancy. The changes vary from woman to woman.   You may gain or lose a couple of pounds at first.  You may feel sick to your stomach (nauseous) and throw up (vomit). If the vomiting is uncontrollable, call your health care provider.  You may tire easily.  You may develop headaches that can be relieved by medicines approved by your health care provider.  You may urinate more often. Painful urination may mean you have a bladder infection.  You may develop heartburn as a result of your pregnancy.  You may develop constipation because certain hormones are causing the muscles that push waste  through your intestines to slow down.  You may develop hemorrhoids or swollen, bulging veins (varicose veins).  Your breasts may begin to grow larger and become tender. Your nipples may stick out more, and the tissue that surrounds them (areola) may become darker.  Your gums may bleed and may be sensitive to brushing and flossing.  Dark spots or blotches (chloasma, mask of pregnancy) may develop on your face. This will likely fade after the baby is born.  Your menstrual periods will stop.  You may have a loss of appetite.  You may develop cravings for certain kinds of food.  You may have changes in your emotions from day to day, such as being excited to be pregnant or being concerned that something may go wrong with the pregnancy and baby.  You may have more vivid and strange dreams.  You may have changes in your hair. These can include thickening of your hair, rapid growth, and changes in texture. Some women also have hair loss during or after pregnancy, or hair that feels dry or thin. Your hair will most likely return to normal after your baby is born. WHAT TO EXPECT AT YOUR PRENATAL VISITS During a routine prenatal visit:  You will be weighed to make sure you and the baby are growing normally.  Your blood pressure will be taken.  Your abdomen will be measured to track your baby's growth.  The fetal heartbeat will be listened to starting around week 10 or 12 of your pregnancy.  Test results from any previous visits will be discussed. Your health care provider may ask you:  How you are feeling.  If you are feeling the baby move.  If you have had any abnormal symptoms, such as leaking fluid, bleeding, severe headaches, or abdominal cramping.  If you are using any tobacco products, including cigarettes, chewing tobacco, and electronic cigarettes.  If you have any questions. Other tests that may be performed during your first trimester include:  Blood tests to find your  blood type and to check for the presence of any previous infections. They will also be used to check for low iron levels (anemia) and Rh antibodies. Later in the pregnancy, blood tests for diabetes will be done along with other tests if problems develop.  Urine tests to check for infections, diabetes, or protein in the urine.  An ultrasound to confirm the proper growth and development of the baby.  An amniocentesis to check for possible genetic problems.  Fetal screens for spina bifida and Down syndrome.  You may need other tests to make sure you and the baby are doing well.  HIV (human immunodeficiency virus) testing. Routine prenatal testing includes screening for HIV, unless you choose not to have this test. HOME CARE INSTRUCTIONS  Medicines  Follow your health care provider's instructions regarding medicine use. Specific medicines may be either safe or unsafe to take during pregnancy.  Take your prenatal vitamins as directed.  If you develop constipation, try taking a stool softener if your health care provider approves. Diet  Eat regular, well-balanced meals. Choose a variety of foods, such as meat or vegetable-based protein, fish, milk and low-fat dairy products, vegetables, fruits, and whole grain breads and cereals. Your health care provider will help you determine the amount of weight gain that is right for you.  Avoid raw meat and uncooked cheese. These carry germs that can cause birth defects in the baby.  Eating four or five small meals rather than three large meals a day may help relieve nausea and vomiting. If you start to feel nauseous, eating a few soda crackers can be helpful. Drinking liquids between meals instead of during meals also seems to help nausea and vomiting.  If you develop constipation, eat more high-fiber foods, such as fresh vegetables or fruit and whole grains. Drink enough fluids to keep your urine clear or pale yellow. Activity and Exercise  Exercise  only as directed by your health care provider. Exercising will help you:  Control your weight.  Stay in shape.  Be prepared for labor and delivery.  Experiencing pain or cramping in the lower abdomen or low back is a good sign that you should stop exercising. Check with your health care provider before continuing normal exercises.  Try to avoid standing for long periods of time. Move your legs often if you must stand in one place for a long time.  Avoid heavy lifting.  Wear low-heeled shoes, and practice good posture.  You may continue to have sex unless your health care provider directs you otherwise. Relief of Pain or Discomfort  Wear a good support bra for breast tenderness.   Take warm sitz baths to soothe any pain or discomfort caused by hemorrhoids. Use hemorrhoid cream if your health care provider approves.   Rest with your legs elevated if you have leg cramps or low back pain.  If you develop varicose veins  in your legs, wear support hose. Elevate your feet for 15 minutes, 3-4 times a day. Limit salt in your diet. Prenatal Care  Schedule your prenatal visits by the twelfth week of pregnancy. They are usually scheduled monthly at first, then more often in the last 2 months before delivery.  Write down your questions. Take them to your prenatal visits.  Keep all your prenatal visits as directed by your health care provider. Safety  Wear your seat belt at all times when driving.  Make a list of emergency phone numbers, including numbers for family, friends, the hospital, and police and fire departments. General Tips  Ask your health care provider for a referral to a local prenatal education class. Begin classes no later than at the beginning of month 6 of your pregnancy.  Ask for help if you have counseling or nutritional needs during pregnancy. Your health care provider can offer advice or refer you to specialists for help with various needs.  Do not use hot tubs,  steam rooms, or saunas.  Do not douche or use tampons or scented sanitary pads.  Do not cross your legs for long periods of time.  Avoid cat litter boxes and soil used by cats. These carry germs that can cause birth defects in the baby and possibly loss of the fetus by miscarriage or stillbirth.  Avoid all smoking, herbs, alcohol, and medicines not prescribed by your health care provider. Chemicals in these affect the formation and growth of the baby.  Do not use any tobacco products, including cigarettes, chewing tobacco, and electronic cigarettes. If you need help quitting, ask your health care provider. You may receive counseling support and other resources to help you quit.  Schedule a dentist appointment. At home, brush your teeth with a soft toothbrush and be gentle when you floss. SEEK MEDICAL CARE IF:   You have dizziness.  You have mild pelvic cramps, pelvic pressure, or nagging pain in the abdominal area.  You have persistent nausea, vomiting, or diarrhea.  You have a bad smelling vaginal discharge.  You have pain with urination.  You notice increased swelling in your face, hands, legs, or ankles. SEEK IMMEDIATE MEDICAL CARE IF:   You have a fever.  You are leaking fluid from your vagina.  You have spotting or bleeding from your vagina.  You have severe abdominal cramping or pain.  You have rapid weight gain or loss.  You vomit blood or material that looks like coffee grounds.  You are exposed to [place] measles and have never had them.  You are exposed to fifth disease or chickenpox.  You develop a severe headache.  You have shortness of breath.  You have any kind of trauma, such as from a fall or a car accident.   This information is not intended to replace advice given to you by your health care provider. Make sure you discuss any questions you have with your health care provider.   Document Released: 06/21/2001 Document Revised: 07/18/2014 Document  Reviewed: 05/07/2013 Elsevier Interactive Patient Education [employer]

## 2015-08-25 NOTE — Assessment & Plan Note (Addendum)
Patient is unsure of her date of last menstrual cycle  Advised to avoid teratogenic medications  Given list of OTC medications that are acceptable to take during pregnancy  - dating [place] today  - advised to schedule first [facility] visit today  - Pre natal vitamins.

## 2015-08-26 ENCOUNTER — Ambulatory Visit (HOSPITAL_COMMUNITY): Payer: Medicaid Other

## 2015-09-02 ENCOUNTER — Ambulatory Visit (HOSPITAL_COMMUNITY): Admission: RE | Admit: 2015-09-02 | Payer: Medicaid Other | Source: Ambulatory Visit

## 2015-10-02 ENCOUNTER — Inpatient Hospital Stay (HOSPITAL_COMMUNITY): Payer: Medicaid Other

## 2015-10-02 ENCOUNTER — Inpatient Hospital Stay (HOSPITAL_COMMUNITY)
Admission: AD | Admit: 2015-10-02 | Discharge: 2015-10-02 | Disposition: A | Discharge status: Home / Self Care | Payer: Medicaid Other | Source: Ambulatory Visit | Attending: Obstetrics & Gynecology | Admitting: Obstetrics & Gynecology

## 2015-10-02 ENCOUNTER — Encounter (HOSPITAL_COMMUNITY): Payer: Self-pay | Admitting: *Deleted

## 2015-10-02 DIAGNOSIS — R112 Nausea with vomiting, unspecified: Secondary | ICD-10-CM | POA: Diagnosis present

## 2015-10-02 DIAGNOSIS — O219 Vomiting of pregnancy, unspecified: Secondary | ICD-10-CM | POA: Diagnosis not present

## 2015-10-02 DIAGNOSIS — R102 Pelvic and perineal pain: Secondary | ICD-10-CM | POA: Diagnosis not present

## 2015-10-02 DIAGNOSIS — O4691 Antepartum hemorrhage, unspecified, first trimester: Secondary | ICD-10-CM | POA: Diagnosis not present

## 2015-10-02 DIAGNOSIS — O23591 Infection of other part of genital tract in pregnancy, first trimester: Secondary | ICD-10-CM | POA: Insufficient documentation

## 2015-10-02 DIAGNOSIS — N760 Acute vaginitis: Secondary | ICD-10-CM | POA: Insufficient documentation

## 2015-10-02 DIAGNOSIS — O26891 Other specified pregnancy related conditions, first trimester: Secondary | ICD-10-CM | POA: Diagnosis not present

## 2015-10-02 DIAGNOSIS — A5901 Trichomonal vulvovaginitis: Secondary | ICD-10-CM

## 2015-10-02 DIAGNOSIS — Z87891 Personal history of nicotine dependence: Secondary | ICD-10-CM | POA: Diagnosis not present

## 2015-10-02 DIAGNOSIS — O209 Hemorrhage in early pregnancy, unspecified: Secondary | ICD-10-CM | POA: Diagnosis not present

## 2015-10-02 DIAGNOSIS — O9989 Other specified diseases and conditions complicating pregnancy, childbirth and the puerperium: Secondary | ICD-10-CM | POA: Diagnosis not present

## 2015-10-02 DIAGNOSIS — O26899 Other specified pregnancy related conditions, unspecified trimester: Secondary | ICD-10-CM

## 2015-10-02 DIAGNOSIS — Z3491 Encounter for supervision of normal pregnancy, unspecified, first trimester: Secondary | ICD-10-CM

## 2015-10-02 DIAGNOSIS — K117 Disturbances of salivary secretion: Secondary | ICD-10-CM

## 2015-10-02 LAB — CBC
HCT: 36.7 % (ref 36.0–46.0)
Hemoglobin: 12.8 g/dL (ref 12.0–15.0)
MCH: 24 pg — AB (ref 26.0–34.0)
MCHC: 34.9 g/dL (ref 30.0–36.0)
MCV: 68.7 fL — ABNORMAL LOW (ref 78.0–100.0)
Platelets: 203 10*3/uL (ref 150–400)
RBC: 5.34 MIL/uL — AB (ref 3.87–5.11)
RDW: 16.6 % — ABNORMAL HIGH (ref 11.5–15.5)
WBC: 13.4 10*3/uL — ABNORMAL HIGH (ref 4.0–10.5)

## 2015-10-02 LAB — WET PREP, GENITAL
CLUE CELLS WET PREP: NONE SEEN
Sperm: NONE SEEN
YEAST WET PREP: NONE SEEN

## 2015-10-02 LAB — HCG, QUANTITATIVE, PREGNANCY: HCG, BETA CHAIN, QUANT, S: 86661 m[IU]/mL — AB (ref <5)

## 2015-10-02 LAB — URINALYSIS, ROUTINE W REFLEX MICROSCOPIC
Bilirubin Urine: NEGATIVE
Glucose, UA: NEGATIVE mg/dL
HGB URINE DIPSTICK: NEGATIVE
Ketones, ur: 40 mg/dL — AB
LEUKOCYTES UA: NEGATIVE
Nitrite: NEGATIVE
PH: 6 (ref 5.0–8.0)
PROTEIN: NEGATIVE mg/dL
Specific Gravity, Urine: 1.015 (ref 1.005–1.030)

## 2015-10-02 MED ORDER — GLYCOPYRROLATE 0.2 MG/ML IJ SOLN
0.1000 mg | Freq: Once | INTRAMUSCULAR | Status: AC
  Administered 2015-10-02: 0.1 mg via INTRAVENOUS
  Filled 2015-10-02: qty 0.5

## 2015-10-02 MED ORDER — PROMETHAZINE HCL 25 MG PO TABS: 12.5000 mg | ORAL_TABLET | Freq: Four times a day (QID) | ORAL | Status: DC | PRN

## 2015-10-02 MED ORDER — DEXTROSE 5 % IN LACTATED RINGERS IV BOLUS
1000.0000 mL | Freq: Once | INTRAVENOUS | Status: AC
  Administered 2015-10-02: 1000 mL via INTRAVENOUS

## 2015-10-02 MED ORDER — METRONIDAZOLE 500 MG PO TABS
2000.0000 mg | ORAL_TABLET | Freq: Once | ORAL | Status: AC
  Administered 2015-10-02: 2000 mg via ORAL
  Filled 2015-10-02: qty 4

## 2015-10-02 MED ORDER — GLYCOPYRROLATE 2 MG PO TABS: 2.0000 mg | ORAL_TABLET | Freq: Three times a day (TID) | ORAL | Status: DC | PRN

## 2015-10-02 MED ORDER — PROMETHAZINE HCL 25 MG/ML IJ SOLN
25.0000 mg | Freq: Once | INTRAMUSCULAR | Status: AC
  Administered 2015-10-02: 25 mg via INTRAVENOUS
  Filled 2015-10-02: qty 1

## 2015-10-02 MED ORDER — ONDANSETRON HCL 4 MG/2ML IJ SOLN
4.0000 mg | Freq: Once | INTRAMUSCULAR | Status: AC
  Administered 2015-10-02: 4 mg via INTRAVENOUS
  Filled 2015-10-02: qty 2

## 2015-10-02 NOTE — MAU Provider Note (Signed)
Chief Complaint: Emesis During Pregnancy; Back Pain; and Abdominal Pain   None     SUBJECTIVE HPI: Natasha Smith is a 25 y.o. [phone] at Unknown by LMP who presents to maternity admissions reporting nausea/vomiting daily x 3-4 weeks, onset of back pain and lower abdominal cramping 3 days ago, and an episode of vaginal bleeding that was light 2 weeks ago that resolved.  The nausea/vomiting is intermittent, becoming more severe at times, like today, and other times is a little better.  She has not tried any medications but it is not resolved by changes to her diet. She reports she cannot keep anything down x 24 hours.  She also reports excess saliva, contributing to her nausea.  Her pain is in her lower back and both sides of her lower abdomen and is unchanged with position change, rest, Tylenol, or heating pad.  She had positive pregnancy test 2/14 at family practice but is unsure of her LMP or gestational age.  She reports no vaginal bleeding since the episode 2 weeks ago and this resolved spontaneously. She denies vaginal itching/burning, urinary symptoms, h/a, dizziness, or fever/chills.     HPI  Past Medical History  Diagnosis Date  . Sickle cell trait (HCC)   . Abnormal Pap smear   . History of chlamydia infection   . Pregnancy induced hypertension     hx  . Herpes simplex type II infection   . Headache   . Infection     UTI   Past Surgical History  Procedure Laterality Date  . Knee surgery     Social History   Social History  . Marital Status: Single    Spouse Name: N/A  . Number of Children: N/A  . Years of Education: N/A   Occupational History  . Not on file.   Social History Main Topics  . Smoking status: Former Games developer  . Smokeless tobacco: Never Used     Comment: March 2015  . Alcohol Use: No  . Drug Use: No  . Sexual Activity: Yes    Birth Control/ Protection: None   Other Topics Concern  . Not on file   Social History Narrative   No current  facility-administered medications on file prior to encounter.   Current Outpatient Prescriptions on File Prior to Encounter  Medication Sig Dispense Refill  . ondansetron (ZOFRAN ODT) 8 MG disintegrating tablet Take 1 tablet (8 mg total) by mouth every 8 (eight) hours as needed for nausea or vomiting. (Patient not taking: Reported on 10/02/2015) 12 tablet 0  . Prenatal Vit-Fe Fumarate-FA (PRENATAL VITAMINS) 28-0.8 MG TABS Take 1 tablet by mouth daily. (Patient not taking: Reported on 10/02/2015) 30 tablet 8   No Known Allergies  ROS:  Review of Systems  Constitutional: Negative for fever, chills and fatigue.  Respiratory: Negative for shortness of breath.   Cardiovascular: Negative for chest pain.  Gastrointestinal: Positive for nausea, vomiting and abdominal pain.  Genitourinary: Positive for pelvic pain. Negative for dysuria, flank pain, vaginal bleeding, vaginal discharge, difficulty urinating and vaginal pain.  Musculoskeletal: Positive for back pain.  Neurological: Negative for dizziness and headaches.  Psychiatric/Behavioral: Negative.      I have reviewed patient's Past Medical Hx, Surgical Hx, Family Hx, Social Hx, medications and allergies.   Physical Exam   Patient Vitals for the past 24 hrs:  BP Temp Temp src Pulse Resp Height Weight  10/02/15 1843 138/86 mmHg - - - - - -  10/02/15 1841 - 97.5 F (36.4 C)  Oral 83 18  (1.626 m) 188 lb (85.276 kg)   Constitutional: Well-developed, well-nourished female in no acute distress.  Cardiovascular: normal rate Respiratory: normal effort GI: Abd soft, non-tender. Pos BS x 4 MS: Extremities nontender, no edema, normal ROM Neurologic: Alert and oriented x 4.  GU: Neg CVAT.  PELVIC EXAM: Cervix pink, visually closed, without lesion, moderate amount frothy yellow discharge, vaginal walls and external genitalia normal Bimanual exam: Cervix 0/long/high, firm, anterior, neg CMT, uterus nontender, nonenlarged, adnexa without  tenderness, enlargement, or mass  RN attempted FHT with doppler and was unsuccessful  LAB RESULTS Results for orders placed or performed during the hospital encounter of 10/02/15 (from the past 24 hour(s))  Urinalysis, Routine w reflex microscopic (not at [facility])     Status: Abnormal   Collection Time: 10/02/15  6:45 PM  Result Value Ref Range   Color, Urine YELLOW YELLOW   APPearance CLEAR CLEAR   Specific Gravity, Urine 1.015 1.005 - 1.030   pH 6.0 5.0 - 8.0   Glucose, UA NEGATIVE NEGATIVE mg/dL   Hgb urine dipstick NEGATIVE NEGATIVE   Bilirubin Urine NEGATIVE NEGATIVE   Ketones, ur 40 (A) NEGATIVE mg/dL   Protein, ur NEGATIVE NEGATIVE mg/dL   Nitrite NEGATIVE NEGATIVE   Leukocytes, UA NEGATIVE NEGATIVE  CBC     Status: Abnormal   Collection Time: 10/02/15  7:16 PM  Result Value Ref Range   WBC 13.4 (H) 4.0 - 10.5 K/uL   RBC 5.34 (H) 3.87 - 5.11 MIL/uL   Hemoglobin 12.8 12.0 - 15.0 g/dL   HCT 69.6 29.5 - 28.4 %   MCV 68.7 (L) 78.0 - 100.0 fL   MCH 24.0 (L) 26.0 - 34.0 pg   MCHC 34.9 30.0 - 36.0 g/dL   RDW 13.2 (H) 44.0 - 10.2 %   Platelets 203 150 - 400 K/uL  hCG, quantitative, pregnancy     Status: Abnormal   Collection Time: 10/02/15  7:16 PM  Result Value Ref Range   hCG, Beta Chain, Quant, S 72536 (H) <5 mIU/mL  Wet prep, genital     Status: Abnormal   Collection Time: 10/02/15  8:15 PM  Result Value Ref Range   Yeast Wet Prep HPF POC NONE SEEN NONE SEEN   Trich, Wet Prep PRESENT (A) NONE SEEN   Clue Cells Wet Prep HPF POC NONE SEEN NONE SEEN   WBC, Wet Prep HPF POC MODERATE (A) NONE SEEN   Sperm NONE SEEN        IMAGING US Ob Comp Less 14 Wks  10/02/2015  CLINICAL DATA:  Back pain abdominal pain for 1.5 weeks. Unsure last menstrual period. No quantitative beta HCG reported. EXAM: OBSTETRIC <14 WK ULTRASOUND TECHNIQUE: Transabdominal ultrasound was performed for evaluation of the gestation as well as the maternal uterus and adnexal regions. COMPARISON:  None.  FINDINGS: Intrauterine gestational sac: A single intrauterine pregnancy is demonstrated. Yolk sac: Yolk sac is not identified, consistent with gestational age. Embryo:  Fetal pole is present. Cardiac Activity: Fetal cardiac activity is observed. Heart Rate: 164 bpm CRL:   46.1  mm   11 w 3 d                  [place] EDC: 04/19/2016 Subchorionic hemorrhage:  None visualized. Maternal uterus/adnexae: Uterus is anteverted. No myometrial mass lesions identified. Both ovaries are visualized and appear normal. No abnormal adnexal mass lesions. No free pelvic fluid. IMPRESSION: Single intrauterine pregnancy. Estimated gestational age by crown-rump length is  11 weeks 3 days. No acute complication is suggested. Electronically Signed   By: Burman NievesWilliam  Stevens M.D.   On: 10/02/2015 19:52    MAU Management/MDM: Ordered labs and reviewed results.  Discussed results of US and wet prep with pt.  Treatments in MAU included D5LR x 1000 ml, Phenergan 25 mg IV, Robinul 0.1 mg IV, Zofran 4 mg IV, and Flagyl 2 g PO. Recommend partner treatment, offered expedited partner therapy but pt declined.  Reviewed risks of STD in pregnancy, including miscarriage and preterm labor.  Pt stable at time of discharge.  ASSESSMENT 1. Normal IUP (intrauterine pregnancy) on prenatal ultrasound, first trimester   2. Vaginal bleeding in pregnancy, first trimester   3. Pelvic pain affecting pregnancy   4. Ptyalism   5. Nausea and vomiting during pregnancy prior to [redacted] weeks gestation   6. Trichomonal vaginitis during pregnancy in first trimester     PLAN Discharge home Rx for Phenergan 12.5-25 mg PO Q 6 hours, Robinul 2g TID PRN [facility]   Follow-up Information    Schedule an appointment as soon as possible for a visit with Sherian ReinBovard-Stuckert, Jody, MD.   Specialty:  Obstetrics and Gynecology   Why:  Return to MAU as needed for emergencies   Contact information:   [address] [place]  [id number] [phone]       Sharen CounterLisa Leftwich-Kirby Certified Nurse-Midwife 10/02/2015  9:35 PM

## 2015-10-02 NOTE — MAU Note (Signed)
[facility] unsure how far along she is, back and abdominal pain x 1 1/2 weeks, about 2 weeks ago had a bleeding episode, unable to keep anything down. Patient does not know when her last period was.

## 2015-10-02 NOTE — Discharge Instructions (Signed)
Morning Sickness °Morning sickness is when you feel sick to your stomach (nauseous) during pregnancy. This nauseous feeling may or may not come with vomiting. It often occurs in the morning but can be a problem any time of day. Morning sickness is most common during the first trimester, but it may continue throughout pregnancy. While morning sickness is unpleasant, it is usually harmless unless you develop severe and continual vomiting (hyperemesis gravidarum). This condition requires more intense treatment.  °CAUSES  °The cause of morning sickness is not completely known but seems to be related to normal hormonal changes that occur in pregnancy. °RISK FACTORS °You are at greater risk if you: °· Experienced nausea or vomiting before your pregnancy. °· Had morning sickness during a previous pregnancy. °· Are pregnant with more than one baby, such as twins. °TREATMENT  °Do not use any medicines (prescription, over-the-counter, or herbal) for morning sickness without first talking to your health care provider. Your health care provider may prescribe or recommend: °· Vitamin B6 supplements. °· Anti-nausea medicines. °· The herbal medicine ginger. °HOME CARE INSTRUCTIONS  °· Only take over-the-counter or prescription medicines as directed by your health care provider. °· Taking multivitamins before getting pregnant can prevent or decrease the severity of morning sickness in most women. °· Eat a piece of dry toast or unsalted crackers before getting out of bed in the morning. °· Eat five or six small meals a day. °· Eat dry and bland foods (rice, baked potato). Foods high in carbohydrates are often helpful. °· Do not drink liquids with your meals. Drink liquids between meals. °· Avoid greasy, fatty, and spicy foods. °· Get someone to cook for you if the smell of any food causes nausea and vomiting. °· If you feel nauseous after taking prenatal vitamins, take the vitamins at night or with a snack.  °· Snack on protein  foods (nuts, yogurt, cheese) between meals if you are hungry. °· Eat unsweetened gelatins for desserts. °· Wearing an acupressure wristband (worn for sea sickness) may be helpful. °· Acupuncture may be helpful. °· Do not smoke. °· Get a humidifier to keep the air in your house free of odors. °· Get plenty of fresh air. °SEEK MEDICAL CARE IF:  °· Your home remedies are not working, and you need medicine. °· You feel dizzy or lightheaded. °· You are losing weight. °SEEK IMMEDIATE MEDICAL CARE IF:  °· You have persistent and uncontrolled nausea and vomiting. °· You pass out (faint). °MAKE SURE YOU: °· Understand these instructions. °· Will watch your condition. °· Will get help right away if you are not doing well or get worse. °  °This information is not intended to replace advice given to you by your health care provider. Make sure you discuss any questions you have with your health care provider. °  °Document Released: 08/18/2006 Document Revised: 07/02/2013 Document Reviewed: 12/12/2012 °Elsevier Interactive Patient Education ©2016 Elsevier Inc. °Trichomoniasis °Trichomoniasis is an infection caused by an organism called Trichomonas. The infection can affect both women and men. In women, the outer female genitalia and the vagina are affected. In men, the penis is mainly affected, but the prostate and other reproductive organs can also be involved. Trichomoniasis is a sexually transmitted infection (STI) and is most often passed to another person through sexual contact.  °RISK FACTORS °· Having unprotected sexual intercourse. °· Having sexual intercourse with an infected partner. °SIGNS AND SYMPTOMS  °Symptoms of trichomoniasis in women include: °· Abnormal gray-green frothy vaginal discharge. °· Itching   vagina. Itching and irritation of the area outside the vagina. Symptoms of trichomoniasis in men include:  Penile discharge with or without pain. Pain during urination. This results from  inflammation of the urethra. DIAGNOSIS  Trichomoniasis may be found during a Pap test or physical exam. Your health care provider may use one of the following methods to help diagnose this infection: Testing the pH of the vagina with a test tape. Using a vaginal swab test that checks for the Trichomonas organism. A test is available that provides results within a few minutes. Examining a urine sample. Testing vaginal secretions. Your health care provider may test you for other STIs, including HIV. TREATMENT  You may be given medicine to fight the infection. Women should inform their health care provider if they could be or are pregnant. Some medicines used to treat the infection should not be taken during pregnancy. Your health care provider may recommend over-the-counter medicines or creams to decrease itching or irritation. Your sexual partner will need to be treated if infected. Your health care provider may test you for infection again 3 months after treatment. HOME CARE INSTRUCTIONS  Take medicines only as directed by your health care provider. Take over-the-counter medicine for itching or irritation as directed by your health care provider. Do not have sexual intercourse while you have the infection. Women should not douche or wear tampons while they have the infection. Discuss your infection with your partner. Your partner may have gotten the infection from you, or you may have gotten it from your partner. Have your sex partner get examined and treated if necessary. Practice safe, informed, and protected sex. See your health care provider for other STI testing. SEEK MEDICAL CARE IF:  You still have symptoms after you finish your medicine. You develop abdominal pain. You have pain when you urinate. You have bleeding after sexual intercourse. You develop a rash. Your medicine makes you sick or makes you throw up (vomit). MAKE SURE YOU: Understand these instructions. Will watch  your condition. Will get help right away if you are not doing well or get worse.   This information is not intended to replace advice given to you by your health care provider. Make sure you discuss any questions you have with your health care provider.   Document Released: 12/21/2000 Document Revised: 07/18/2014 Document Reviewed: 04/08/2013 Elsevier Interactive Patient Education [employer]2016 Elsevier Inc.

## 2015-10-03 LAB — HIV ANTIBODY (ROUTINE TESTING W REFLEX): HIV Screen 4th Generation wRfx: NONREACTIVE

## 2015-10-05 LAB — GC/CHLAMYDIA PROBE AMP (~~LOC~~) NOT AT ARMC
Chlamydia: NEGATIVE
Neisseria Gonorrhea: NEGATIVE

## 2016-02-12 ENCOUNTER — Encounter (HOSPITAL_COMMUNITY): Payer: Self-pay

## 2016-02-12 ENCOUNTER — Inpatient Hospital Stay (HOSPITAL_COMMUNITY)
Admission: AD | Admit: 2016-02-12 | Discharge: 2016-02-12 | Disposition: A | Discharge status: Home / Self Care | Payer: Medicaid Other | Source: Ambulatory Visit | Attending: Obstetrics and Gynecology | Admitting: Obstetrics and Gynecology

## 2016-02-12 DIAGNOSIS — Z79899 Other long term (current) drug therapy: Secondary | ICD-10-CM | POA: Diagnosis not present

## 2016-02-12 DIAGNOSIS — Z87891 Personal history of nicotine dependence: Secondary | ICD-10-CM | POA: Insufficient documentation

## 2016-02-12 DIAGNOSIS — O26893 Other specified pregnancy related conditions, third trimester: Secondary | ICD-10-CM | POA: Diagnosis not present

## 2016-02-12 DIAGNOSIS — O2693 Pregnancy related conditions, unspecified, third trimester: Secondary | ICD-10-CM | POA: Diagnosis not present

## 2016-02-12 DIAGNOSIS — M545 Low back pain: Secondary | ICD-10-CM | POA: Insufficient documentation

## 2016-02-12 DIAGNOSIS — D573 Sickle-cell trait: Secondary | ICD-10-CM | POA: Diagnosis not present

## 2016-02-12 DIAGNOSIS — O2343 Unspecified infection of urinary tract in pregnancy, third trimester: Secondary | ICD-10-CM | POA: Insufficient documentation

## 2016-02-12 DIAGNOSIS — Z3A30 30 weeks gestation of pregnancy: Secondary | ICD-10-CM | POA: Diagnosis not present

## 2016-02-12 DIAGNOSIS — Z7982 Long term (current) use of aspirin: Secondary | ICD-10-CM | POA: Diagnosis not present

## 2016-02-12 DIAGNOSIS — M549 Dorsalgia, unspecified: Secondary | ICD-10-CM | POA: Diagnosis present

## 2016-02-12 LAB — URINALYSIS, ROUTINE W REFLEX MICROSCOPIC
Bilirubin Urine: NEGATIVE
Glucose, UA: NEGATIVE mg/dL
Ketones, ur: NEGATIVE mg/dL
Nitrite: NEGATIVE
PROTEIN: NEGATIVE mg/dL
SPECIFIC GRAVITY, URINE: 1.025 (ref 1.005–1.030)
pH: 5.5 (ref 5.0–8.0)

## 2016-02-12 LAB — URINE MICROSCOPIC-ADD ON

## 2016-02-12 MED ORDER — CEPHALEXIN 500 MG PO CAPS: 500.0000 mg | ORAL_CAPSULE | Freq: Four times a day (QID) | ORAL | 0 refills | Status: DC

## 2016-02-12 MED ORDER — CYCLOBENZAPRINE HCL 5 MG PO TABS
5.0000 mg | ORAL_TABLET | Freq: Once | ORAL | Status: AC
  Administered 2016-02-12: 5 mg via ORAL
  Filled 2016-02-12: qty 1

## 2016-02-12 MED ORDER — ACETAMINOPHEN 500 MG PO TABS
1000.0000 mg | ORAL_TABLET | Freq: Once | ORAL | Status: AC
  Administered 2016-02-12: 1000 mg via ORAL
  Filled 2016-02-12: qty 2

## 2016-02-12 NOTE — MAU Provider Note (Signed)
History     CSN: [id number]  Arrival date and time: 02/12/16 1128   None     Chief Complaint  Patient presents with  . Back Pain  . Blurred Vision   HPI  Natasha Smith is a 25 y.o. female [phone] @ [redacted]w[redacted]d here with multiple complaints however her biggest concern today is left sided lower back pain that started yesterday. The pain comes and goes. 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 When she is working she feels dizzy at times and has to sit and take breaks.   When asked about fetal movements the patient states "baby is moving well".   She attests to nausea and vomiting all throughout her pregnancy She also attests to one, maybe two episodes of diarrhea today, and 2-3 episodes of diarrhea yesterday.   She denies vaginal bleeding or leaking of fluid.   OB History    Gravida Para Term Preterm AB Living   7 3 3  0 3 3   SAB TAB Ectopic Multiple Live Births   1 1 0 0 3      Past Medical History:  Diagnosis Date  . Abnormal Pap smear   . Headache   . Herpes simplex type II infection   . History of chlamydia infection   . Infection    UTI  . Pregnancy induced hypertension    hx  . Sickle cell trait [facility])     Past Surgical History:  Procedure Laterality Date  . KNEE SURGERY      Family History  Problem Relation Age of Onset  . Diabetes Maternal Grandmother   . Hypertension Mother   . Asthma Mother   . Heart disease Maternal Grandfather   . Asthma Sister   . Asthma Brother   . Cancer Maternal Aunt     Social History  Substance Use Topics  . Smoking status: Former Games developer  . Smokeless tobacco: Never Used     Comment: March 2015  . Alcohol use No    Allergies: No Known Allergies  Prescriptions Prior to Admission  Medication Sig Dispense Refill Last Dose  . aspirin 81 MG tablet Take 81 mg by mouth daily.   02/11/2016 at Unknown time  . Prenatal MV & Min w/FA-DHA (PRENATAL ADULT GUMMY/DHA/FA PO) Take 3 each by mouth daily.   02/11/2016 at  Unknown time  . glycopyrrolate (ROBINUL) 2 MG tablet Take 1 tablet (2 mg total) by mouth 3 (three) times daily as needed. (Patient not taking: Reported on 02/12/2016) 30 tablet 3 Not Taking at Unknown time  . promethazine (PHENERGAN) 25 MG tablet Take 0.5-1 tablets (12.5-25 mg total) by mouth every 6 (six) hours as needed for nausea. (Patient not taking: Reported on 02/12/2016) 30 tablet 2 Not Taking at Unknown time   Results for orders placed or performed during the hospital encounter of 02/12/16 (from the past 48 hour(s))  Urinalysis, Routine w reflex microscopic (not at [facility])     Status: Abnormal   Collection Time: 02/12/16 12:06 PM  Result Value Ref Range   Color, Urine YELLOW YELLOW   APPearance CLEAR CLEAR   Specific Gravity, Urine 1.025 1.005 - 1.030   pH 5.5 5.0 - 8.0   Glucose, UA NEGATIVE NEGATIVE mg/dL   Hgb urine dipstick TRACE (A) NEGATIVE   Bilirubin Urine NEGATIVE NEGATIVE   Ketones, ur NEGATIVE NEGATIVE mg/dL   Protein, ur NEGATIVE NEGATIVE mg/dL   Nitrite NEGATIVE NEGATIVE   Leukocytes, UA LARGE (A)  NEGATIVE  Urine microscopic-add on     Status: Abnormal   Collection Time: 02/12/16 12:06 PM  Result Value Ref Range   Squamous Epithelial / LPF 6-30 (A) NONE SEEN   WBC, UA 6-30 0 - 5 WBC/hpf   RBC / HPF 6-30 0 - 5 RBC/hpf   Bacteria, UA MANY (A) NONE SEEN   Urine-Other MUCOUS PRESENT     Review of Systems  Constitutional: Negative for chills and fever.  Gastrointestinal: Positive for constipation, diarrhea, nausea and vomiting. Negative for abdominal pain.  Genitourinary: Positive for frequency and urgency. Negative for dysuria, flank pain and hematuria.   Physical Exam   Blood pressure 115/75, pulse 102, temperature 97.8 F (36.6 C), temperature source Oral, resp. rate 18, height  (1.626 m), weight 194 lb 12.8 oz (88.4 kg), last menstrual period 05/23/2015, SpO2 99 %, unknown if currently breastfeeding.  Physical Exam  Constitutional: She is oriented to person,  place, and time. She appears well-developed and well-nourished. No distress.  HENT:  Head: Normocephalic.  Eyes: Pupils are equal, round, and reactive to light.  GI: There is no CVA tenderness.  Genitourinary:  Genitourinary Comments: Dilation: Closed Exam by:: Shela Commons Nayali Talerico, NP  Musculoskeletal: Normal range of motion.  Neurological: She is alert and oriented to person, place, and time.  Skin: Skin is warm. She is not diaphoretic.  Psychiatric: Her behavior is normal.   Fetal Tracing: Baseline: 130 bpm  Variability: Moderate  Accelerations: 15x15 Decelerations: quick variables   Toco: none  MAU Course  Procedures  None  MDM  Urine culture pending  Discussed patient with Dr. Stefano Gaul.   Assessment and Plan   A:  1. Low back pain during pregnancy in third trimester   2. UTI in pregnancy, third trimester     P:  Discharge home in stable condition Rx: Keflex Urine culture pending Follow up with OB as scheduled Work note provided  Ok to take tylenol as directed on the bottle Return to MAU if symptoms worsen     Duane Lope, NP 02/12/2016 1:48 PM

## 2016-02-12 NOTE — MAU Note (Signed)
Patient states having left lower back pain that feels like contractions that started yesterday at 11am.  Also complaining of increased vaginal discharge, blurred vision, diarrhea, vomiting, and "the baby is moving less than usual."

## 2016-02-12 NOTE — Discharge Instructions (Signed)
Pregnancy and Urinary Tract Infection  A urinary tract infection (UTI) is a bacterial infection of the urinary tract. Infection of the urinary tract can include the ureters, kidneys (pyelonephritis), bladder (cystitis), and urethra (urethritis). All pregnant women should be screened for bacteria in the urinary tract. Identifying and treating a UTI will decrease the risk of preterm labor and developing more serious infections in both the mother and baby.  CAUSES  Bacteria germs cause almost all UTIs.   RISK FACTORS  Many factors can increase your chances of getting a UTI during pregnancy. These include:  · Having a short urethra.  · Poor toilet and hygiene habits.  · Sexual intercourse.  · Blockage of urine along the urinary tract.  · Problems with the pelvic muscles or nerves.  · Diabetes.  · Obesity.  · Bladder problems after having several children.  · Previous history of UTI.  SIGNS AND SYMPTOMS   · Pain, burning, or a stinging feeling when urinating.  · Suddenly feeling the need to urinate right away (urgency).  · Loss of bladder control (urinary incontinence).  · Frequent urination, more than is common with pregnancy.  · Lower abdominal or back discomfort.  · Cloudy urine.  · Blood in the urine (hematuria).  · Fever.   When the kidneys are infected, the symptoms may be:  · Back pain.  · Flank pain on the right side more so than the left.  · Fever.  · Chills.  · Nausea.  · Vomiting.  DIAGNOSIS   A urinary tract infection is usually diagnosed through urine tests. Additional tests and procedures are sometimes done. These may include:  · Ultrasound exam of the kidneys, ureters, bladder, and urethra.  · Looking in the bladder with a lighted tube (cystoscopy).  TREATMENT  Typically, UTIs can be treated with antibiotic medicines.   HOME CARE INSTRUCTIONS   · Only take over-the-counter or prescription medicines as directed by your health care provider. If you were prescribed antibiotics, take them as directed. Finish  them even if you start to feel better.  · Drink enough fluids to keep your urine clear or pale yellow.  · Do not have sexual intercourse until the infection is gone and your health care provider says it is okay.  · Make sure you are tested for UTIs throughout your pregnancy. These infections often come back.   Preventing a UTI in the Future  · Practice good toilet habits. Always wipe from front to back. Use the tissue only once.  · Do not hold your urine. Empty your bladder as soon as possible when the urge comes.  · Do not douche or use deodorant sprays.  · Wash with soap and warm water around the genital area and the anus.  · Empty your bladder before and after sexual intercourse.  · Wear underwear with a cotton crotch.  · Avoid caffeine and carbonated drinks. They can irritate the bladder.  · Drink cranberry juice or take cranberry pills. This may decrease the risk of getting a UTI.  · Do not drink alcohol.  · Keep all your appointments and tests as scheduled.   SEEK MEDICAL CARE IF:   · Your symptoms get worse.  · You are still having fevers 2 or more days after treatment begins.  · You have a rash.  · You feel that you are having problems with medicines prescribed.  · You have abnormal vaginal discharge.  SEEK IMMEDIATE MEDICAL CARE IF:   · You have back or flank   pain.  · You have chills.  · You have blood in your urine.  · You have nausea and vomiting.  · You have contractions of your uterus.  · You have a gush of fluid from the vagina.  MAKE SURE YOU:  · Understand these instructions.    · Will watch your condition.    · Will get help right away if you are not doing well or get worse.       This information is not intended to replace advice given to you by your health care provider. Make sure you discuss any questions you have with your health care provider.     Document Released: 10/22/2010 Document Revised: 04/17/2013 Document Reviewed: 01/24/2013  Elsevier Interactive Patient Education ©2016 Elsevier  Inc.

## 2016-02-13 LAB — CULTURE, OB URINE: SPECIAL REQUESTS: NORMAL

## 2016-03-11 ENCOUNTER — Encounter (HOSPITAL_COMMUNITY): Payer: Self-pay

## 2016-03-11 ENCOUNTER — Inpatient Hospital Stay (HOSPITAL_COMMUNITY)
Admission: AD | Admit: 2016-03-11 | Discharge: 2016-03-11 | Disposition: A | Discharge status: Home / Self Care | Payer: Medicaid Other | Source: Ambulatory Visit | Attending: Obstetrics and Gynecology | Admitting: Obstetrics and Gynecology

## 2016-03-11 DIAGNOSIS — Z7982 Long term (current) use of aspirin: Secondary | ICD-10-CM | POA: Insufficient documentation

## 2016-03-11 DIAGNOSIS — D573 Sickle-cell trait: Secondary | ICD-10-CM | POA: Diagnosis not present

## 2016-03-11 DIAGNOSIS — R110 Nausea: Secondary | ICD-10-CM | POA: Insufficient documentation

## 2016-03-11 DIAGNOSIS — Z3A34 34 weeks gestation of pregnancy: Secondary | ICD-10-CM | POA: Insufficient documentation

## 2016-03-11 DIAGNOSIS — Z9889 Other specified postprocedural states: Secondary | ICD-10-CM | POA: Diagnosis not present

## 2016-03-11 DIAGNOSIS — R030 Elevated blood-pressure reading, without diagnosis of hypertension: Secondary | ICD-10-CM | POA: Diagnosis not present

## 2016-03-11 DIAGNOSIS — R51 Headache: Secondary | ICD-10-CM | POA: Diagnosis not present

## 2016-03-11 DIAGNOSIS — O26893 Other specified pregnancy related conditions, third trimester: Secondary | ICD-10-CM | POA: Insufficient documentation

## 2016-03-11 DIAGNOSIS — R519 Headache, unspecified: Secondary | ICD-10-CM

## 2016-03-11 DIAGNOSIS — Z87891 Personal history of nicotine dependence: Secondary | ICD-10-CM | POA: Diagnosis not present

## 2016-03-11 DIAGNOSIS — Z3689 Encounter for other specified antenatal screening: Secondary | ICD-10-CM

## 2016-03-11 LAB — CBC
HEMATOCRIT: 30.1 % — AB (ref 36.0–46.0)
Hemoglobin: 10 g/dL — ABNORMAL LOW (ref 12.0–15.0)
MCH: 23.1 pg — ABNORMAL LOW (ref 26.0–34.0)
MCHC: 33.2 g/dL (ref 30.0–36.0)
MCV: 69.5 fL — AB (ref 78.0–100.0)
Platelets: 135 10*3/uL — ABNORMAL LOW (ref 150–400)
RBC: 4.33 MIL/uL (ref 3.87–5.11)
RDW: 15.7 % — ABNORMAL HIGH (ref 11.5–15.5)
WBC: 9.8 10*3/uL (ref 4.0–10.5)

## 2016-03-11 LAB — COMPREHENSIVE METABOLIC PANEL
ALK PHOS: 108 U/L (ref 38–126)
ALT: 12 U/L — AB (ref 14–54)
AST: 14 U/L — AB (ref 15–41)
Albumin: 2.9 g/dL — ABNORMAL LOW (ref 3.5–5.0)
Anion gap: 5 (ref 5–15)
BILIRUBIN TOTAL: 0.5 mg/dL (ref 0.3–1.2)
CO2: 22 mmol/L (ref 22–32)
CREATININE: 0.49 mg/dL (ref 0.44–1.00)
Calcium: 8.7 mg/dL — ABNORMAL LOW (ref 8.9–10.3)
Chloride: 106 mmol/L (ref 101–111)
GFR calc Af Amer: >60 mL/min (ref >60)
Glucose, Bld: 80 mg/dL (ref 65–99)
Potassium: 3.6 mmol/L (ref 3.5–5.1)
Sodium: 133 mmol/L — ABNORMAL LOW (ref 135–145)
TOTAL PROTEIN: 6.1 g/dL — AB (ref 6.5–8.1)

## 2016-03-11 LAB — WET PREP, GENITAL
Sperm: NONE SEEN
TRICH WET PREP: NONE SEEN
Yeast Wet Prep HPF POC: NONE SEEN

## 2016-03-11 LAB — PROTEIN / CREATININE RATIO, URINE
CREATININE, URINE: 128 mg/dL
PROTEIN CREATININE RATIO: 0.09 mg/mg{creat} (ref 0.00–0.15)
TOTAL PROTEIN, URINE: 12 mg/dL

## 2016-03-11 LAB — URINALYSIS, ROUTINE W REFLEX MICROSCOPIC
Bilirubin Urine: NEGATIVE
Glucose, UA: NEGATIVE mg/dL
KETONES UR: NEGATIVE mg/dL
NITRITE: NEGATIVE
PH: 6 (ref 5.0–8.0)
Protein, ur: NEGATIVE mg/dL
Specific Gravity, Urine: 1.015 (ref 1.005–1.030)

## 2016-03-11 LAB — URINE MICROSCOPIC-ADD ON

## 2016-03-11 MED ORDER — ACETAMINOPHEN 500 MG PO TABS
1000.0000 mg | ORAL_TABLET | Freq: Four times a day (QID) | ORAL | Status: DC | PRN
  Administered 2016-03-11: 1000 mg via ORAL
  Filled 2016-03-11: qty 2

## 2016-03-11 NOTE — MAU Provider Note (Signed)
DATE: 03/11/2016  Maternity Admissions Unit History and Physical Exam for an Obstetrics Patient  Ms. Natasha Smith is a 25 y.o. female, [phone], at 3776w3d gestation, who presents for evaluation of a headache. [facility].  Her pregnancy has been complicated by a history of headaches even before she was pregnant. She denies right upper quadrant tenderness and blurred vision. See history below.  OB History    Gravida Para Term Preterm AB Living   7 3 3  0 3 3   SAB TAB Ectopic Multiple Live Births   1 1 0 0 3      Past Medical History:  Diagnosis Date  . Abnormal Pap smear   . Headache   . Herpes simplex type II infection   . History of chlamydia infection   . Infection    UTI  . Pregnancy induced hypertension    hx  . Sickle cell trait (HCC)     No prescriptions prior to admission.    Past Surgical History:  Procedure Laterality Date  . KNEE SURGERY      No Known Allergies  Family History: family history includes Asthma in her brother, mother, and sister; Cancer in her maternal aunt; Diabetes in her maternal grandmother; Heart disease in her maternal grandfather; Hypertension in her mother.  Social History:  reports that she has quit smoking. She has never used smokeless tobacco. She reports that she does not drink alcohol or use drugs.  Review of systems: Normal pregnancy complaints.  Admission Physical Exam:  Dilation: Closed Effacement (%): Thick Exam by:: m bhambri,cnm Body mass index is 33.99 kg/m.  Blood pressure 149/86, pulse 79, temperature 97.7 F (36.5 C), temperature source Oral, resp. rate 18, height 5\' 4"  (1.626 m), weight 198 lb (89.8 kg), last menstrual period 05/23/2015, SpO2 100 %, unknown if currently breastfeeding.  HEENT:                 Within normal limits Abdomen:             Gravid and nontender Extremities:          Grossly normal Neurologic  exam: Grossly normal  NST: Category 1; Contractions: None .   Results for orders placed or performed during the hospital encounter of 03/11/16 (from the past 24 hour(s))  Urinalysis, Routine w reflex microscopic (not at [facility])     Status: Abnormal   Collection Time: 03/11/16 12:40 PM  Result Value Ref Range   Color, Urine YELLOW YELLOW   APPearance HAZY (A) CLEAR   Specific Gravity, Urine 1.015 1.005 - 1.030   pH 6.0 5.0 - 8.0   Glucose, UA NEGATIVE NEGATIVE mg/dL   Hgb urine dipstick TRACE (A) NEGATIVE   Bilirubin Urine NEGATIVE NEGATIVE   Ketones, ur NEGATIVE NEGATIVE mg/dL   Protein, ur NEGATIVE NEGATIVE mg/dL   Nitrite NEGATIVE NEGATIVE   Leukocytes, UA LARGE (A) NEGATIVE  Urine microscopic-add on     Status: Abnormal   Collection Time: 03/11/16 12:40 PM  Result Value Ref Range   Squamous Epithelial / LPF 6-30 (A) NONE SEEN   WBC, UA 6-30 0 - 5 WBC/hpf   RBC / HPF 0-5 0 - 5 RBC/hpf   Bacteria, UA MANY (A) NONE SEEN  Protein / creatinine ratio, urine     Status: None   Collection Time: 03/11/16 12:40 PM  Result Value Ref Range   Creatinine, Urine 128.00 mg/dL  Total Protein, Urine 12 mg/dL   Protein Creatinine Ratio 0.09 0.00 - 0.15 mg/mg[Cre]  Wet prep, genital     Status: Abnormal   Collection Time: 03/11/16  1:35 PM  Result Value Ref Range   Yeast Wet Prep HPF POC NONE SEEN NONE SEEN   Trich, Wet Prep NONE SEEN NONE SEEN   Clue Cells Wet Prep HPF POC PRESENT (A) NONE SEEN   WBC, Wet Prep HPF POC MANY (A) NONE SEEN   Sperm NONE SEEN   CBC     Status: Abnormal   Collection Time: 03/11/16  1:48 PM  Result Value Ref Range   WBC 9.8 4.0 - 10.5 K/uL   RBC 4.33 3.87 - 5.11 MIL/uL   Hemoglobin 10.0 (L) 12.0 - 15.0 g/dL   HCT 16.1 (L) 09.6 - 04.5 %   MCV 69.5 (L) 78.0 - 100.0 fL   MCH 23.1 (L) 26.0 - 34.0 pg   MCHC 33.2 30.0 - 36.0 g/dL   RDW 40.9 (H) 81.1 - 91.4 %   Platelets 135 (L) 150 - 400 K/uL  Comprehensive metabolic panel     Status: Abnormal   Collection  Time: 03/11/16  1:48 PM  Result Value Ref Range   Sodium 133 (L) 135 - 145 mmol/L   Potassium 3.6 3.5 - 5.1 mmol/L   Chloride 106 101 - 111 mmol/L   CO2 22 22 - 32 mmol/L   Glucose, Bld 80 65 - 99 mg/dL   BUN <5 (L) 6 - 20 mg/dL   Creatinine, Ser 7.82 0.44 - 1.00 mg/dL   Calcium 8.7 (L) 8.9 - 10.3 mg/dL   Total Protein 6.1 (L) 6.5 - 8.1 g/dL   Albumin 2.9 (L) 3.5 - 5.0 g/dL   AST 14 (L) 15 - 41 U/L   ALT 12 (L) 14 - 54 U/L   Alkaline Phosphatase 108 38 - 126 U/L   Total Bilirubin 0.5 0.3 - 1.2 mg/dL   GFR calc non Af Amer >60 >60 mL/min   GFR calc Af Amer >60 >60 mL/min   Anion gap 5 5 - 15   Hospital Course:  The patient was observed in the hospital. She was given Tylenol for her headache. The headache improved. Her blood pressures remain stable. Her PIH labs were normal except for a platelet count of 135,000. The patient was discharged to home.   Assessment:  [redacted]w[redacted]d gestation  Headache that improved with Tylenol  No evidence of preeclampsia  Platelet count of 135,000  Plan:  The patient was discharged to home.  The patient will follow-up in the office in one week.  The patient will take Tylenol as needed for headaches.  We will repeat the patient's platelet count in 4 weeks.   Nima Bamburg V 03/11/2016, 5:30 PM

## 2016-03-11 NOTE — Discharge Instructions (Signed)

## 2016-03-11 NOTE — MAU Note (Addendum)
Pt c/o a headache since last night that will not go away. Pt took Tylenol last night and it did not help with her headache. Pt woke up in the middle of the night with nausea and vomited. This morning the nausea/vomiting and headache have continued. Pt states baby hasn't been moving well today.

## 2016-03-11 NOTE — MAU Provider Note (Signed)
History     CSN: [id number]  Arrival date and time: 03/11/16 1230   None     Chief Complaint  Patient presents with  . Headache  . Nausea   [id number] @34 .3 weeks c/o frontal HA since last night. She took Tylenol with minimal to no relief. She denies epigastric pain but reports "seeing black spots" She also reports increased vaginal moisture since last night. No gush of fluid. She reports decreased FM today. No VB or ctx. She has a hx of pre-e with last pregnancy.     OB History    Gravida Para Term Preterm AB Living   7 3 3  0 3 3   SAB TAB Ectopic Multiple Live Births   1 1 0 0 3      Past Medical History:  Diagnosis Date  . Abnormal Pap smear   . Headache   . Herpes simplex type II infection   . History of chlamydia infection   . Infection    UTI  . Pregnancy induced hypertension    hx  . Sickle cell trait [facility])     Past Surgical History:  Procedure Laterality Date  . KNEE SURGERY      Family History  Problem Relation Age of Onset  . Diabetes Maternal Grandmother   . Hypertension Mother   . Asthma Mother   . Heart disease Maternal Grandfather   . Asthma Sister   . Asthma Brother   . Cancer Maternal Aunt     Social History  Substance Use Topics  . Smoking status: Former Games developer  . Smokeless tobacco: Never Used     Comment: March 2015  . Alcohol use No    Allergies: No Known Allergies  Prescriptions Prior to Admission  Medication Sig Dispense Refill Last Dose  . aspirin 81 MG tablet Take 81 mg by mouth daily.   02/11/2016 at Unknown time  . cephALEXin (KEFLEX) 500 MG capsule Take 1 capsule (500 mg total) by mouth 4 (four) times daily. 20 capsule 0   . Prenatal MV & Min w/FA-DHA (PRENATAL ADULT GUMMY/DHA/FA PO) Take 3 each by mouth daily.   02/11/2016 at Unknown time    Review of Systems  Constitutional: Negative.   Eyes: Negative for blurred vision and double vision.  Gastrointestinal: Negative.  Negative for abdominal pain.  Musculoskeletal:  Positive for back pain (ongoing for >1 month).  Neurological: Positive for headaches.   Physical Exam   Blood pressure 127/84, pulse 90, temperature 98.3 F (36.8 C), resp. rate 17, height 5\' 4"  (1.626 m), weight 89.8 kg (198 lb), last menstrual period 05/23/2015, SpO2 100 %, unknown if currently breastfeeding.  Physical Exam  Constitutional: She is oriented to person, place, and time. She appears well-developed and well-nourished.  HENT:  Head: Normocephalic and atraumatic.  Neck: Normal range of motion. Neck supple.  Cardiovascular: Normal rate and regular rhythm.   Respiratory: Effort normal and breath sounds normal.  GI: Soft. Bowel sounds are normal. She exhibits no distension. There is no tenderness.  gravid  Genitourinary:  Genitourinary Comments: External: no lesions Vagina: rugated, parous, creamy discharge, no pool, no fern SVE: closed/thick   Musculoskeletal: Normal range of motion.  Neurological: She is alert and oriented to person, place, and time.  Skin: Skin is warm and dry.  Psychiatric: She has a normal mood and affect.   EFM: 135 bpm, mod variability, + accels, no decels Toco: UI Results for orders placed or performed during the hospital encounter of 03/11/16 (from  the past 24 hour(s))  Urinalysis, Routine w reflex microscopic (not at [facility])     Status: Abnormal   Collection Time: 03/11/16 12:40 PM  Result Value Ref Range   Color, Urine YELLOW YELLOW   APPearance HAZY (A) CLEAR   Specific Gravity, Urine 1.015 1.005 - 1.030   pH 6.0 5.0 - 8.0   Glucose, UA NEGATIVE NEGATIVE mg/dL   Hgb urine dipstick TRACE (A) NEGATIVE   Bilirubin Urine NEGATIVE NEGATIVE   Ketones, ur NEGATIVE NEGATIVE mg/dL   Protein, ur NEGATIVE NEGATIVE mg/dL   Nitrite NEGATIVE NEGATIVE   Leukocytes, UA LARGE (A) NEGATIVE  Urine microscopic-add on     Status: Abnormal   Collection Time: 03/11/16 12:40 PM  Result Value Ref Range   Squamous Epithelial / LPF 6-30 (A) NONE SEEN   WBC, UA  6-30 0 - 5 WBC/hpf   RBC / HPF 0-5 0 - 5 RBC/hpf   Bacteria, UA MANY (A) NONE SEEN  Protein / creatinine ratio, urine     Status: None   Collection Time: 03/11/16 12:40 PM  Result Value Ref Range   Creatinine, Urine 128.00 mg/dL   Total Protein, Urine 12 mg/dL   Protein Creatinine Ratio 0.09 0.00 - 0.15 mg/mg[Cre]  Wet prep, genital     Status: Abnormal   Collection Time: 03/11/16  1:35 PM  Result Value Ref Range   Yeast Wet Prep HPF POC NONE SEEN NONE SEEN   Trich, Wet Prep NONE SEEN NONE SEEN   Clue Cells Wet Prep HPF POC PRESENT (A) NONE SEEN   WBC, Wet Prep HPF POC MANY (A) NONE SEEN   Sperm NONE SEEN   CBC     Status: Abnormal   Collection Time: 03/11/16  1:48 PM  Result Value Ref Range   WBC 9.8 4.0 - 10.5 K/uL   RBC 4.33 3.87 - 5.11 MIL/uL   Hemoglobin 10.0 (L) 12.0 - 15.0 g/dL   HCT 40.9 (L) 81.1 - 91.4 %   MCV 69.5 (L) 78.0 - 100.0 fL   MCH 23.1 (L) 26.0 - 34.0 pg   MCHC 33.2 30.0 - 36.0 g/dL   RDW 78.2 (H) 95.6 - 21.3 %   Platelets 135 (L) 150 - 400 K/uL  Comprehensive metabolic panel     Status: Abnormal   Collection Time: 03/11/16  1:48 PM  Result Value Ref Range   Sodium 133 (L) 135 - 145 mmol/L   Potassium 3.6 3.5 - 5.1 mmol/L   Chloride 106 101 - 111 mmol/L   CO2 22 22 - 32 mmol/L   Glucose, Bld 80 65 - 99 mg/dL   BUN <5 (L) 6 - 20 mg/dL   Creatinine, Ser 0.86 0.44 - 1.00 mg/dL   Calcium 8.7 (L) 8.9 - 10.3 mg/dL   Total Protein 6.1 (L) 6.5 - 8.1 g/dL   Albumin 2.9 (L) 3.5 - 5.0 g/dL   AST 14 (L) 15 - 41 U/L   ALT 12 (L) 14 - 54 U/L   Alkaline Phosphatase 108 38 - 126 U/L   Total Bilirubin 0.5 0.3 - 1.2 mg/dL   GFR calc non Af Amer >60 >60 mL/min   GFR calc Af Amer >60 >60 mL/min   Anion gap 5 5 - 15    MAU Course  Procedures Tylenol 1 gm po x1 MDM Labs ordered and reviewed. Dr. Stefano Gaul in to see pt. HA improved from pain scale of 8 down to 5. Dr. Stefano Gaul notified of labs and improvement of HA.  Dr. Stefano GaulStringer back in to see pt. Stable for discharge  home.   Assessment and Plan  34.[redacted] weeks gestation Headache Borderline HTN Reactive NST  Discharge home  Tylenol prn Follow up at [facility] in 1 week Pre-e precautions  Donette LarryMelanie Bhambri, CNM 03/11/2016, 1:40 PM   PROGRESS NOTE  I have reviewed the patient's vital signs, labs, and notes. I have examined the patient. I agree with the previous note from the Certified Nurse Midwife.  Leonard SchwartzArthur Vernon Alyssamarie Mounsey, M.D. 03/11/2016

## 2016-03-15 LAB — GC/CHLAMYDIA PROBE AMP (~~LOC~~) NOT AT ARMC
Chlamydia: NEGATIVE
NEISSERIA GONORRHEA: NEGATIVE

## 2016-03-22 ENCOUNTER — Encounter (HOSPITAL_COMMUNITY): Payer: Self-pay

## 2016-03-22 ENCOUNTER — Inpatient Hospital Stay (HOSPITAL_COMMUNITY)
Admission: AD | Admit: 2016-03-22 | Discharge: 2016-03-22 | Disposition: A | Discharge status: Home / Self Care | Payer: Medicaid Other | Source: Ambulatory Visit | Attending: Obstetrics and Gynecology | Admitting: Obstetrics and Gynecology

## 2016-03-22 DIAGNOSIS — O99013 Anemia complicating pregnancy, third trimester: Secondary | ICD-10-CM | POA: Diagnosis not present

## 2016-03-22 DIAGNOSIS — Z3A36 36 weeks gestation of pregnancy: Secondary | ICD-10-CM | POA: Diagnosis not present

## 2016-03-22 DIAGNOSIS — D573 Sickle-cell trait: Secondary | ICD-10-CM | POA: Insufficient documentation

## 2016-03-22 DIAGNOSIS — O26893 Other specified pregnancy related conditions, third trimester: Secondary | ICD-10-CM | POA: Insufficient documentation

## 2016-03-22 DIAGNOSIS — G4489 Other headache syndrome: Secondary | ICD-10-CM

## 2016-03-22 DIAGNOSIS — IMO0001 Reserved for inherently not codable concepts without codable children: Secondary | ICD-10-CM

## 2016-03-22 DIAGNOSIS — R030 Elevated blood-pressure reading, without diagnosis of hypertension: Secondary | ICD-10-CM | POA: Diagnosis not present

## 2016-03-22 DIAGNOSIS — O133 Gestational [pregnancy-induced] hypertension without significant proteinuria, third trimester: Secondary | ICD-10-CM

## 2016-03-22 DIAGNOSIS — R51 Headache: Secondary | ICD-10-CM | POA: Diagnosis present

## 2016-03-22 DIAGNOSIS — Z87891 Personal history of nicotine dependence: Secondary | ICD-10-CM | POA: Insufficient documentation

## 2016-03-22 LAB — URINE MICROSCOPIC-ADD ON: RBC / HPF: NONE SEEN RBC/hpf (ref 0–5)

## 2016-03-22 LAB — COMPREHENSIVE METABOLIC PANEL
ALBUMIN: 3.1 g/dL — AB (ref 3.5–5.0)
ALT: 13 U/L — ABNORMAL LOW (ref 14–54)
ANION GAP: 8 (ref 5–15)
AST: 17 U/L (ref 15–41)
Alkaline Phosphatase: 149 U/L — ABNORMAL HIGH (ref 38–126)
BILIRUBIN TOTAL: 0.9 mg/dL (ref 0.3–1.2)
CHLORIDE: 105 mmol/L (ref 101–111)
CO2: 21 mmol/L — ABNORMAL LOW (ref 22–32)
Calcium: 8.9 mg/dL (ref 8.9–10.3)
Creatinine, Ser: 0.55 mg/dL (ref 0.44–1.00)
GFR calc Af Amer: >60 mL/min (ref >60)
Glucose, Bld: 78 mg/dL (ref 65–99)
POTASSIUM: 3.6 mmol/L (ref 3.5–5.1)
Sodium: 134 mmol/L — ABNORMAL LOW (ref 135–145)
TOTAL PROTEIN: 6.7 g/dL (ref 6.5–8.1)

## 2016-03-22 LAB — URINALYSIS, ROUTINE W REFLEX MICROSCOPIC
Bilirubin Urine: NEGATIVE
Glucose, UA: NEGATIVE mg/dL
Ketones, ur: 15 mg/dL — AB
NITRITE: NEGATIVE
PROTEIN: NEGATIVE mg/dL
SPECIFIC GRAVITY, URINE: 1.02 (ref 1.005–1.030)
pH: 6 (ref 5.0–8.0)

## 2016-03-22 LAB — CBC
HCT: 30.4 % — ABNORMAL LOW (ref 36.0–46.0)
Hemoglobin: 10.1 g/dL — ABNORMAL LOW (ref 12.0–15.0)
MCH: 22.6 pg — ABNORMAL LOW (ref 26.0–34.0)
MCHC: 33.2 g/dL (ref 30.0–36.0)
MCV: 68.2 fL — ABNORMAL LOW (ref 78.0–100.0)
PLATELETS: 129 10*3/uL — AB (ref 150–400)
RBC: 4.46 MIL/uL (ref 3.87–5.11)
RDW: 15.6 % — AB (ref 11.5–15.5)
WBC: 9.7 10*3/uL (ref 4.0–10.5)

## 2016-03-22 LAB — PROTEIN / CREATININE RATIO, URINE
CREATININE, URINE: 178 mg/dL
PROTEIN CREATININE RATIO: 0.09 mg/mg{creat} (ref 0.00–0.15)
Total Protein, Urine: 16 mg/dL

## 2016-03-22 NOTE — MAU Note (Addendum)
Pt rep rots she has been vomiting on and off since yesterday. Passed some white mucus. C/o Back pain and rectal pain as well. Pt alos c/o Headache.

## 2016-03-22 NOTE — MAU Provider Note (Signed)
History     CSN: [id number]  Arrival date and time: 03/22/16 1140   First Provider Initiated Contact with Patient 03/22/16 1227      Chief Complaint  Patient presents with  . Emesis  . Back Pain   HPI  Patient is a 25 year old G7 P3 at 36 weeks and 0 days who presents today for headache with 1 episode of vomiting last night. She also reports some minimal chest pressure. She reports she did take some Tylenol overnight without much improvement in her pain but now it seems to be getting much better especially in her head. She does report a history of preeclampsia at 36 weeks and 6 days with her last pregnancy. She reports regular fetal movement denies contractions and denies vaginal bleeding or leakage of fluids. Eyes right upper quadrant pain.  OB History    Gravida Para Term Preterm AB Living   7 3 3  0 3 3   SAB TAB Ectopic Multiple Live Births   1 1 0 0 3      Past Medical History:  Diagnosis Date  . Abnormal Pap smear   . Headache   . Herpes simplex type II infection   . History of chlamydia infection   . Infection    UTI  . Pregnancy induced hypertension    hx  . Sickle cell trait [facility])     Past Surgical History:  Procedure Laterality Date  . KNEE SURGERY      Family History  Problem Relation Age of Onset  . Diabetes Maternal Grandmother   . Hypertension Mother   . Asthma Mother   . Heart disease Maternal Grandfather   . Asthma Sister   . Asthma Brother   . Cancer Maternal Aunt     Social History  Substance Use Topics  . Smoking status: Former Games developer  . Smokeless tobacco: Never Used     Comment: March 2015  . Alcohol use No    Allergies: No Known Allergies  Prescriptions Prior to Admission  Medication Sig Dispense Refill Last Dose  . acetaminophen (TYLENOL) 325 MG tablet Take 650 mg by mouth every 6 (six) hours as needed.   03/21/2016 at Unknown time  . Prenatal MV & Min w/FA-DHA (PRENATAL ADULT GUMMY/DHA/FA PO) Take 3 each by mouth daily.    03/21/2016 at Unknown time    Review of Systems  Constitutional: Negative for chills and fever.  HENT: Negative for congestion, hearing loss and tinnitus.   Eyes: Negative for blurred vision and double vision.  Respiratory: Negative for cough, hemoptysis and sputum production.   Cardiovascular: Negative for chest pain and palpitations.  Gastrointestinal: Negative for abdominal pain, heartburn, nausea and vomiting.  Genitourinary: Negative for dysuria, frequency and urgency.  Musculoskeletal: Negative for back pain, myalgias and neck pain.  Skin: Negative for itching and rash.  Neurological: Positive for headaches. Negative for dizziness, tingling and tremors.  Endo/Heme/Allergies: Negative for environmental allergies. Does not bruise/bleed easily.   Physical Exam   Blood pressure 112/65, pulse 85, temperature 97.7 F (36.5 C), temperature source Oral, resp. rate 18, height 5\' 4"  (1.626 m), weight 198 lb 12.8 oz (90.2 kg), last menstrual period 05/23/2015, SpO2 99 %, unknown if currently breastfeeding.  Physical Exam  Vitals reviewed. Constitutional: She is oriented to person, place, and time. She appears well-developed and well-nourished.  HENT:  Head: Normocephalic and atraumatic.  Cardiovascular: Normal rate, regular rhythm and normal heart sounds.  Exam reveals no gallop and no friction rub.  No murmur heard. Respiratory: Effort normal and breath sounds normal. No respiratory distress.  GI: Soft. Bowel sounds are normal. There is no tenderness.  Gravid  Musculoskeletal: Normal range of motion. She exhibits no edema.  Neurological: She is alert and oriented to person, place, and time. No cranial nerve deficit. Coordination normal.  Skin: Skin is warm and dry.  Psychiatric: She has a normal mood and affect. Her behavior is normal.   NST: Reactive  MAU Course  Procedures  MDM In the MAU patient underwent continuous fetal monitoring revealed a reactive nonstress test. Patient  also underwent testing for preeclampsia given elevated blood pressure on admission. Labs were normal. Patient was reevaluated and offered medication for her headache however she reported that the headache was getting better on its own and declined any further interventions. Case was discussed with Dr. Estanislado Pandyivard recommended follow-up this week for follow-up blood pressure monitoring and discharged home  Assessment and Plan  #1: Elevated blood pressure in pregnancy. Patient with history of preeclampsia and elevated blood pressures today. Normal labs and headache is improving on its own without intervention. We'll continue to monitor closely with follow-up in the office.  Ernestina Pennaicholas Schenk 03/22/2016, 2:21 PM

## 2016-03-22 NOTE — Discharge Instructions (Signed)

## 2016-03-27 ENCOUNTER — Inpatient Hospital Stay (HOSPITAL_COMMUNITY)
Admission: AD | Admit: 2016-03-27 | Discharge: 2016-03-28 | Disposition: A | Discharge status: Home / Self Care | Payer: Medicaid Other | Source: Ambulatory Visit | Attending: Obstetrics and Gynecology | Admitting: Obstetrics and Gynecology

## 2016-03-27 ENCOUNTER — Encounter (HOSPITAL_COMMUNITY): Payer: Self-pay

## 2016-03-27 DIAGNOSIS — O368390 Maternal care for abnormalities of the fetal heart rate or rhythm, unspecified trimester, not applicable or unspecified: Secondary | ICD-10-CM

## 2016-03-27 DIAGNOSIS — Z3A36 36 weeks gestation of pregnancy: Secondary | ICD-10-CM

## 2016-03-27 LAB — PROTEIN / CREATININE RATIO, URINE
CREATININE, URINE: 181 mg/dL
Protein Creatinine Ratio: 0.06 mg/mg{Cre} (ref 0.00–0.15)
TOTAL PROTEIN, URINE: 10 mg/dL

## 2016-03-27 LAB — COMPREHENSIVE METABOLIC PANEL
ALT: 10 U/L — AB (ref 14–54)
AST: 13 U/L — ABNORMAL LOW (ref 15–41)
Albumin: 3.3 g/dL — ABNORMAL LOW (ref 3.5–5.0)
Alkaline Phosphatase: 150 U/L — ABNORMAL HIGH (ref 38–126)
Anion gap: 6 (ref 5–15)
BILIRUBIN TOTAL: 0.8 mg/dL (ref 0.3–1.2)
BUN: 6 mg/dL (ref 6–20)
CALCIUM: 8.9 mg/dL (ref 8.9–10.3)
CHLORIDE: 106 mmol/L (ref 101–111)
CO2: 21 mmol/L — ABNORMAL LOW (ref 22–32)
CREATININE: 0.56 mg/dL (ref 0.44–1.00)
Glucose, Bld: 78 mg/dL (ref 65–99)
Potassium: 3.3 mmol/L — ABNORMAL LOW (ref 3.5–5.1)
Sodium: 133 mmol/L — ABNORMAL LOW (ref 135–145)
TOTAL PROTEIN: 6.4 g/dL — AB (ref 6.5–8.1)

## 2016-03-27 LAB — CBC
HCT: 30 % — ABNORMAL LOW (ref 36.0–46.0)
HEMOGLOBIN: 9.8 g/dL — AB (ref 12.0–15.0)
MCH: 22.3 pg — AB (ref 26.0–34.0)
MCHC: 32.7 g/dL (ref 30.0–36.0)
MCV: 68.2 fL — AB (ref 78.0–100.0)
Platelets: 142 10*3/uL — ABNORMAL LOW (ref 150–400)
RBC: 4.4 MIL/uL (ref 3.87–5.11)
RDW: 15.5 % (ref 11.5–15.5)
WBC: 13.4 10*3/uL — ABNORMAL HIGH (ref 4.0–10.5)

## 2016-03-27 LAB — URIC ACID: Uric Acid, Serum: 2.9 mg/dL (ref 2.3–6.6)

## 2016-03-27 LAB — LACTATE DEHYDROGENASE: LDH: 142 U/L (ref 98–192)

## 2016-03-27 NOTE — MAU Note (Signed)
Pt c/o contractions that started today and are every 5-6 mins. Denies LOF or vag bleeding. Has felt FM today but has not felt any since late afternoon. Last SVE was 3cm.

## 2016-03-28 ENCOUNTER — Inpatient Hospital Stay (HOSPITAL_COMMUNITY): Payer: Medicaid Other

## 2016-03-28 LAB — OB RESULTS CONSOLE GBS: GBS: POSITIVE

## 2016-03-28 NOTE — Discharge Instructions (Signed)
Braxton Hicks Contractions °Contractions of the uterus can occur throughout pregnancy. Contractions are not always a sign that you are in labor.  °WHAT ARE BRAXTON HICKS CONTRACTIONS?  °Contractions that occur before labor are called Braxton Hicks contractions, or false labor. Toward the end of pregnancy (32-34 weeks), these contractions can develop more often and may become more forceful. This is not true labor because these contractions do not result in opening (dilatation) and thinning of the cervix. They are sometimes difficult to tell apart from true labor because these contractions can be forceful and people have different pain tolerances. You should not feel embarrassed if you go to the hospital with false labor. Sometimes, the only way to tell if you are in true labor is for your health care provider to look for changes in the cervix. °If there are no prenatal problems or other health problems associated with the pregnancy, it is completely safe to be sent home with false labor and await the onset of true labor. °HOW CAN YOU TELL THE DIFFERENCE BETWEEN TRUE AND FALSE LABOR? °False Labor °· The contractions of false labor are usually shorter and not as hard as those of true labor.   °· The contractions are usually irregular.   °· The contractions are often felt in the front of the lower abdomen and in the groin.   °· The contractions may go away when you walk around or change positions while lying down.   °· The contractions get weaker and are shorter lasting as time goes on.   °· The contractions do not usually become progressively stronger, regular, and closer together as with true labor.   °True Labor °· Contractions in true labor last 30-70 seconds, become very regular, usually become more intense, and increase in frequency.   °· The contractions do not go away with walking.   °· The discomfort is usually felt in the top of the uterus and spreads to the lower abdomen and low back.   °· True labor can be  determined by your health care provider with an exam. This will show that the cervix is dilating and getting thinner.   °WHAT TO REMEMBER °· Keep up with your usual exercises and follow other instructions given by your health care provider.   °· Take medicines as directed by your health care provider.   °· Keep your regular prenatal appointments.   °· Eat and drink lightly if you think you are going into labor.   °· If Braxton Hicks contractions are making you uncomfortable:   °¨ Change your position from lying down or resting to walking, or from walking to resting.   °¨ Sit and rest in a tub of warm water.   °¨ Drink 2-3 glasses of water. Dehydration may cause these contractions.   °¨ Do slow and deep breathing several times an hour.   °WHEN SHOULD I SEEK IMMEDIATE MEDICAL CARE? °Seek immediate medical care if: °· Your contractions become stronger, more regular, and closer together.   °· You have fluid leaking or gushing from your vagina.   °· You have a fever.   °· You pass blood-tinged mucus.   °· You have vaginal bleeding.   °· You have continuous abdominal pain.   °· You have low back pain that you never had before.   °· You feel your baby's head pushing down and causing pelvic pressure.   °· Your baby is not moving as much as it used to.   °  °This information is not intended to replace advice given to you by your health care provider. Make sure you discuss any questions you have with your health care   provider. °  °Document Released: 06/27/2005 Document Revised: 07/02/2013 Document Reviewed: 04/08/2013 °Elsevier Interactive Patient Education ©2016 [employer] ° °

## 2016-04-04 ENCOUNTER — Encounter (HOSPITAL_COMMUNITY): Payer: Self-pay

## 2016-04-04 ENCOUNTER — Inpatient Hospital Stay (HOSPITAL_COMMUNITY): Payer: Medicaid Other

## 2016-04-04 ENCOUNTER — Inpatient Hospital Stay (HOSPITAL_COMMUNITY)
Admission: AD | Admit: 2016-04-04 | Discharge: 2016-04-05 | Disposition: A | Discharge status: Home / Self Care | Payer: Medicaid Other | Source: Ambulatory Visit | Attending: Obstetrics & Gynecology | Admitting: Obstetrics & Gynecology

## 2016-04-04 DIAGNOSIS — W19XXXA Unspecified fall, initial encounter: Secondary | ICD-10-CM

## 2016-04-04 DIAGNOSIS — Y92009 Unspecified place in unspecified non-institutional (private) residence as the place of occurrence of the external cause: Secondary | ICD-10-CM | POA: Insufficient documentation

## 2016-04-04 DIAGNOSIS — B9689 Other specified bacterial agents as the cause of diseases classified elsewhere: Secondary | ICD-10-CM

## 2016-04-04 DIAGNOSIS — O26893 Other specified pregnancy related conditions, third trimester: Secondary | ICD-10-CM | POA: Insufficient documentation

## 2016-04-04 DIAGNOSIS — N760 Acute vaginitis: Secondary | ICD-10-CM

## 2016-04-04 DIAGNOSIS — O23593 Infection of other part of genital tract in pregnancy, third trimester: Secondary | ICD-10-CM | POA: Diagnosis not present

## 2016-04-04 DIAGNOSIS — W1830XA Fall on same level, unspecified, initial encounter: Secondary | ICD-10-CM | POA: Insufficient documentation

## 2016-04-04 DIAGNOSIS — O471 False labor at or after 37 completed weeks of gestation: Secondary | ICD-10-CM | POA: Diagnosis not present

## 2016-04-04 DIAGNOSIS — Z3A38 38 weeks gestation of pregnancy: Secondary | ICD-10-CM | POA: Insufficient documentation

## 2016-04-04 DIAGNOSIS — Z87891 Personal history of nicotine dependence: Secondary | ICD-10-CM | POA: Insufficient documentation

## 2016-04-04 LAB — WET PREP, GENITAL
SPERM: NONE SEEN
Trich, Wet Prep: NONE SEEN
Yeast Wet Prep HPF POC: NONE SEEN

## 2016-04-04 LAB — AMNISURE RUPTURE OF MEMBRANE (ROM) NOT AT ARMC: AMNISURE: NEGATIVE

## 2016-04-04 MED ORDER — OXYCODONE-ACETAMINOPHEN 5-325 MG PO TABS
1.0000 | ORAL_TABLET | Freq: Once | ORAL | Status: AC
  Administered 2016-04-04: 1 via ORAL
  Filled 2016-04-04: qty 1

## 2016-04-04 NOTE — MAU Note (Signed)
Pt presents with complaint of leaking fluid since yesterday, fell about an hour ago and has been having contractions since that time.

## 2016-04-05 DIAGNOSIS — Y92009 Unspecified place in unspecified non-institutional (private) residence as the place of occurrence of the external cause: Secondary | ICD-10-CM

## 2016-04-05 DIAGNOSIS — W19XXXA Unspecified fall, initial encounter: Secondary | ICD-10-CM

## 2016-04-05 DIAGNOSIS — O471 False labor at or after 37 completed weeks of gestation: Secondary | ICD-10-CM

## 2016-04-05 DIAGNOSIS — B9689 Other specified bacterial agents as the cause of diseases classified elsewhere: Secondary | ICD-10-CM

## 2016-04-05 DIAGNOSIS — N760 Acute vaginitis: Secondary | ICD-10-CM

## 2016-04-05 LAB — CBC
HCT: 31.8 % — ABNORMAL LOW (ref 36.0–46.0)
Hemoglobin: 10.5 g/dL — ABNORMAL LOW (ref 12.0–15.0)
MCH: 22.4 pg — ABNORMAL LOW (ref 26.0–34.0)
MCHC: 33 g/dL (ref 30.0–36.0)
MCV: 67.9 fL — AB (ref 78.0–100.0)
PLATELETS: 158 10*3/uL (ref 150–400)
RBC: 4.68 MIL/uL (ref 3.87–5.11)
RDW: 15.9 % — AB (ref 11.5–15.5)
WBC: 13 10*3/uL — AB (ref 4.0–10.5)

## 2016-04-05 MED ORDER — OXYCODONE-ACETAMINOPHEN 5-325 MG PO TABS: 1.0000 | ORAL_TABLET | Freq: Four times a day (QID) | ORAL | 0 refills | Status: DC | PRN

## 2016-04-05 MED ORDER — METRONIDAZOLE 500 MG PO TABS: 500.0000 mg | ORAL_TABLET | Freq: Two times a day (BID) | ORAL | 0 refills | Status: DC

## 2016-04-05 NOTE — MAU Provider Note (Signed)
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 No abdominal contact. Ctxs started immediately after fall. Also c/o LOF since yesterday. Denies recent intercourse or VB. Reports active fetus.  Patient Active Problem List   Diagnosis Date Noted  . Bacterial vaginosis 04/05/2016  . Fall at home 04/05/2016  . False labor after 37 completed weeks of gestation 04/05/2016  . Sickle cell trait (HCC) 04/16/2014  . 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 Menorrhagia 01/04/2012  . OBESITY 03/03/2008  . ECZEMA, ATOPIC DERMATITIS 09/07/2006    No chief complaint on file.  HPI  As above  OB History    Gravida Para Term Preterm AB Living   7 3 3  0 3 3   SAB TAB Ectopic Multiple Live Births   1 1 0 0 3      Past Medical History:  Diagnosis Date  . Abnormal Pap smear   . Headache   . Herpes simplex type II infection   . History of chlamydia infection   . Infection    UTI  . Pregnancy induced hypertension    hx  . Sickle cell trait [facility](HCC)     Past Surgical History:  Procedure Laterality Date  . KNEE SURGERY      Family History  Problem Relation Age of Onset  . Diabetes Maternal Grandmother   . Hypertension Mother   . Asthma Mother   . Heart disease Maternal Grandfather   . Asthma Sister   . Asthma Brother   . Cancer Maternal Aunt     Social History  Substance Use Topics  . Smoking status: Former Games developermoker  . Smokeless tobacco: Never Used     Comment: March 2015  . Alcohol use No    Allergies: No Known Allergies  Prescriptions Prior to Admission  Medication Sig Dispense Refill Last Dose  . acetaminophen (TYLENOL) 325 MG tablet Take 650 mg by mouth every 6 (six) hours as needed.   03/21/2016 at Unknown time  . Prenatal MV & Min w/FA-DHA (PRENATAL ADULT GUMMY/DHA/FA PO) Take 3 each by mouth daily.   03/21/2016 at Unknown time   ROS 10 Systems  reviewed and are negative for acute change except as noted in the HPI.  Physical Exam   Last menstrual period 05/23/2015, unknown if currently breastfeeding.  Physical Exam  Gen: NAD. Abdomen: soft, tender to left, no rebound or guarding. External genitalia: No external erythema, edema or excoriation. Vagina: No lesions. No blood in vault. SSE: moderate amount of tan, thick, malodorous discharge -- whiff test positive. Sample collected for wet prep and amnisure as cervical exam was previously done. Neg pooling or valsalva. Pelvic: 3/50/-2 per RN.  Ext: WNL. FHRT: BL 140 w/ moderate variability, +accels, no decels. No ctxs per toco or palpation.     ED Course  Assessment: IUP at 38.0 wks R/O abruption & ROM Cat 1 FHRT  Plan: US Amnisure CBC Percocet tab 4-hr monitoring   [name], [name], MS 04/05/2016 12:12 AM   ADDENDUM:  Preliminary u/s: Cephalic presentation, posterior fundal placenta, AFI 17.69 cm (largest pocket 5.82 cm)  Results for orders placed or performed during the hospital encounter of 04/04/16 (from the past 24 hour(s))  Amnisure [facility] (rom[facility]     Status: None   Collection Time: 04/04/16 11:20 PM  Result Value Ref Range   Amnisure  ROM NEGATIVE   Wet prep, genital     Status: Abnormal   Collection Time: 04/04/16 11:20 PM  Result Value Ref Range   Yeast Wet Prep HPF POC NONE SEEN NONE SEEN   Trich, Wet Prep NONE SEEN NONE SEEN   Clue Cells Wet Prep HPF POC PRESENT (A) NONE SEEN   WBC, Wet Prep HPF POC MODERATE (A) NONE SEEN   Sperm NONE SEEN   CBC     Status: Abnormal   Collection Time: 04/04/16 11:50 PM  Result Value Ref Range   WBC 13.0 (H) 4.0 - 10.5 K/uL   RBC 4.68 3.87 - 5.11 MIL/uL   Hemoglobin 10.5 (L) 12.0 - 15.0 g/dL   HCT 40.9 (L) 81.1 - 91.4 %   MCV 67.9 (L) 78.0 - 100.0 fL   MCH 22.4 (L) 26.0 - 34.0 pg   MCHC 33.0 30.0 - 36.0 g/dL   RDW 78.2 (H) 95.6 - 21.3 %   Platelets 158 150 - 400 K/uL   A: Neg  ROM BV Back and left abdominal pain Cat 1 FHRT No ctxs Normal labs & ultrasound   P: D/C home Po Metronidazole x 7 days Strict labor precautions/FKCs Percocet tabs #20 Keep OB appt today   Sherre Scarlet, CNM 04/05/16, 1:10 AM

## 2016-04-05 NOTE — Discharge Instructions (Signed)
Bacterial Vaginosis Bacterial vaginosis is an infection of the vagina. It happens when too many germs (bacteria) grow in the vagina. Having this infection puts you at risk for getting other infections from sex. Treating this infection can help lower your risk for other infections, such as:   Chlamydia.  Gonorrhea.  HIV.  Herpes. HOME CARE  Take your medicine as told by your doctor.  Finish your medicine even if you start to feel better.  Tell your sex partner that you have an infection. They should see their doctor for treatment.  During treatment:  Avoid sex or use condoms correctly.  Do not douche.  Do not drink alcohol unless your doctor tells you it is ok.  Do not breastfeed unless your doctor tells you it is ok. GET HELP IF:  You are not getting better after 3 days of treatment.  You have more grey fluid (discharge) coming from your vagina than before.  You have more pain than before.  You have a fever. MAKE SURE YOU:   Understand these instructions.  Will watch your condition.  Will get help right away if you are not doing well or get worse.   This information is not intended to replace advice given to you by your health care provider. Make sure you discuss any questions you have with your health care provider.   Document Released: 04/05/2008 Document Revised: 07/18/2014 Document Reviewed: 02/06/2013 Elsevier Interactive Patient Education 2016 Elsevier Inc[employer] of the uterus can occur throughout pregnancy. Contractions are not always a sign that you are in labor.  WHAT ARE BRAXTON HICKS CONTRACTIONS?  Contractions that occur before labor are called Braxton Hicks contractions, or false labor. Toward the end of pregnancy (32-34 weeks), these contractions can develop more often and may become more forceful. This is not true labor because these contractions do not result in opening (dilatation) and thinning of the cervix. They  are sometimes difficult to tell apart from true labor because these contractions can be forceful and people have different pain tolerances. You should not feel embarrassed if you go to the hospital with false labor. Sometimes, the only way to tell if you are in true labor is for your health care provider to look for changes in the cervix. If there are no prenatal problems or other health problems associated with the pregnancy, it is completely safe to be sent home with false labor and await the onset of true labor. HOW CAN YOU TELL THE DIFFERENCE BETWEEN TRUE AND FALSE LABOR? False Labor  The contractions of false labor are usually shorter and not as hard as those of true labor.   The contractions are usually irregular.   The contractions are often felt in the front of the lower abdomen and in the groin.   The contractions may go away when you walk around or change positions while lying down.   The contractions get weaker and are shorter lasting as time goes on.   The contractions do not usually become progressively stronger, regular, and closer together as with true labor.  True Labor  Contractions in true labor last 30-70 seconds, become very regular, usually become more intense, and increase in frequency.   The contractions do not go away with walking.   The discomfort is usually felt in the top of the uterus and spreads to the lower abdomen and low back.   True labor can be determined by your health care provider with an exam. This will show  that the cervix is dilating and getting thinner.  WHAT TO REMEMBER  Keep up with your usual exercises and follow other instructions given by your health care provider.   Take medicines as directed by your health care provider.   Keep your regular prenatal appointments.   Eat and drink lightly if you think you are going into labor.   If Braxton Hicks contractions are making you uncomfortable:   Change your position from  lying down or resting to walking, or from walking to resting.   Sit and rest in a tub of warm water.   Drink 2-3 glasses of water. Dehydration may cause these contractions.   Do slow and deep breathing several times an hour.  WHEN SHOULD I SEEK IMMEDIATE MEDICAL CARE? Seek immediate medical care if:  Your contractions become stronger, more regular, and closer together.   You have fluid leaking or gushing from your vagina.   You have a fever.   You pass blood-tinged mucus.   You have vaginal bleeding.   You have continuous abdominal pain.   You have low back pain that you never had before.   You feel your baby's head pushing down and causing pelvic pressure.   Your baby is not moving as much as it used to.    This information is not intended to replace advice given to you by your health care provider. Make sure you discuss any questions you have with your health care provider.   Document Released: 06/27/2005 Document Revised: 07/02/2013 Document Reviewed: 04/08/2013 Elsevier Interactive Patient Education 2016 [employer] Fall Prevention in the Home  Falls can cause injuries and can affect people from all age groups. There are many simple things that you can do to make your home safe and to help prevent falls. WHAT CAN I DO ON THE OUTSIDE OF MY HOME?  Regularly repair the edges of walkways and driveways and fix any cracks.  Remove high doorway thresholds.  Trim any shrubbery on the main path into your home.  Use bright outdoor lighting.  Clear walkways of debris and clutter, including tools and rocks.  Regularly check that handrails are securely fastened and in good repair. Both sides of any steps should have handrails.  Install guardrails along the edges of any raised decks or porches.  Have leaves, snow, and ice cleared regularly.  Use sand or salt on walkways during winter months.  In the garage, clean up any spills right away, including grease  or oil spills. WHAT CAN I DO IN THE BATHROOM?  Use night lights.  Install grab bars by the toilet and in the tub and shower. Do not use towel bars as grab bars.  Use non-skid mats or decals on the floor of the tub or shower.  If you need to sit down while you are in the shower, use a plastic, non-slip stool.Marland Kitchen.  Keep the floor dry. Immediately clean up any water that spills on the floor.  Remove soap buildup in the tub or shower on a regular basis.  Attach bath mats securely with double-sided non-slip rug tape.  Remove throw rugs and other tripping hazards from the floor. WHAT CAN I DO IN THE BEDROOM?  Use night lights.  Make sure that a bedside light is easy to reach.  Do not use oversized bedding that drapes onto the floor.  Have a firm chair that has side arms to use for getting dressed.  Remove throw rugs and other tripping hazards from the floor. WHAT  CAN I DO IN THE KITCHEN?   Clean up any spills right away.  Avoid walking on wet floors.  Place frequently used items in easy-to-reach places.  If you need to reach for something above you, use a sturdy step stool that has a grab bar.  Keep electrical cables out of the way.  Do not use floor polish or wax that makes floors slippery. If you have to use wax, make sure that it is non-skid floor wax.  Remove throw rugs and other tripping hazards from the floor. WHAT CAN I DO IN THE STAIRWAYS?  Do not leave any items on the stairs.  Make sure that there are handrails on both sides of the stairs. Fix handrails that are broken or loose. Make sure that handrails are as long as the stairways.  Check any carpeting to make sure that it is firmly attached to the stairs. Fix any carpet that is loose or worn.  Avoid having throw rugs at the top or bottom of stairways, or secure the rugs with carpet tape to prevent them from moving.  Make sure that you have a light switch at the top of the stairs and the bottom of the stairs.  If you do not have them, have them installed. WHAT ARE SOME OTHER FALL PREVENTION TIPS?  Wear closed-toe shoes that fit well and support your feet. Wear shoes that have rubber soles or low heels.  When you use a stepladder, make sure that it is completely opened and that the sides are firmly locked. Have someone hold the ladder while you are using it. Do not climb a closed stepladder.  Add color or contrast paint or tape to grab bars and handrails in your home. Place contrasting color strips on the first and last steps.  Use mobility aids as needed, such as canes, walkers, scooters, and crutches.  Turn on lights if it is dark. Replace any light bulbs that burn out.  Set up furniture so that there are clear paths. Keep the furniture in the same spot.  Fix any uneven floor surfaces.  Choose a carpet design that does not hide the edge of steps of a stairway.  Be aware of any and all pets.  Review your medicines with your healthcare provider. Some medicines can cause dizziness or changes in blood pressure, which increase your risk of falling. Talk with your health care provider about other ways that you can decrease your risk of falls. This may include working with a physical therapist or trainer to improve your strength, balance, and endurance.   This information is not intended to replace advice given to you by your health care provider. Make sure you discuss any questions you have with your health care provider.   Document Released: 06/17/2002 Document Revised: 11/11/2014 Document Reviewed: 08/01/2014 Elsevier Interactive Patient Education [employer]

## 2016-04-11 ENCOUNTER — Inpatient Hospital Stay (HOSPITAL_COMMUNITY)
Admission: AD | Admit: 2016-04-11 | Discharge: 2016-04-13 | DRG: 774 | Disposition: A | Discharge status: Home / Self Care | Payer: Medicaid Other | Source: Ambulatory Visit | Attending: Obstetrics and Gynecology | Admitting: Obstetrics and Gynecology

## 2016-04-11 ENCOUNTER — Encounter (HOSPITAL_COMMUNITY): Payer: Self-pay | Admitting: *Deleted

## 2016-04-11 DIAGNOSIS — Z3483 Encounter for supervision of other normal pregnancy, third trimester: Secondary | ICD-10-CM | POA: Diagnosis present

## 2016-04-11 DIAGNOSIS — Z833 Family history of diabetes mellitus: Secondary | ICD-10-CM

## 2016-04-11 DIAGNOSIS — Z87891 Personal history of nicotine dependence: Secondary | ICD-10-CM

## 2016-04-11 DIAGNOSIS — Z8249 Family history of ischemic heart disease and other diseases of the circulatory system: Secondary | ICD-10-CM | POA: Diagnosis not present

## 2016-04-11 DIAGNOSIS — A6000 Herpesviral infection of urogenital system, unspecified: Secondary | ICD-10-CM | POA: Diagnosis present

## 2016-04-11 DIAGNOSIS — D573 Sickle-cell trait: Secondary | ICD-10-CM | POA: Diagnosis present

## 2016-04-11 DIAGNOSIS — O9832 Other infections with a predominantly sexual mode of transmission complicating childbirth: Secondary | ICD-10-CM | POA: Diagnosis present

## 2016-04-11 DIAGNOSIS — Z3A38 38 weeks gestation of pregnancy: Secondary | ICD-10-CM

## 2016-04-11 DIAGNOSIS — O9902 Anemia complicating childbirth: Principal | ICD-10-CM | POA: Diagnosis present

## 2016-04-11 LAB — CBC
HCT: 33 % — ABNORMAL LOW (ref 36.0–46.0)
HEMOGLOBIN: 11.1 g/dL — AB (ref 12.0–15.0)
MCH: 22.9 pg — AB (ref 26.0–34.0)
MCHC: 33.6 g/dL (ref 30.0–36.0)
MCV: 68.2 fL — ABNORMAL LOW (ref 78.0–100.0)
PLATELETS: 146 10*3/uL — AB (ref 150–400)
RBC: 4.84 MIL/uL (ref 3.87–5.11)
RDW: 16.1 % — ABNORMAL HIGH (ref 11.5–15.5)
WBC: 16.1 10*3/uL — AB (ref 4.0–10.5)

## 2016-04-11 LAB — OB RESULTS CONSOLE GBS: GBS: NEGATIVE

## 2016-04-11 MED ORDER — COCONUT OIL OIL: 1.0000 "application " | TOPICAL_OIL | Status: DC | PRN

## 2016-04-11 MED ORDER — ZOLPIDEM TARTRATE 5 MG PO TABS: 5.0000 mg | ORAL_TABLET | Freq: Every evening | ORAL | Status: DC | PRN

## 2016-04-11 MED ORDER — ACETAMINOPHEN 325 MG PO TABS
650.0000 mg | ORAL_TABLET | ORAL | Status: DC | PRN
  Administered 2016-04-11: 650 mg via ORAL
  Filled 2016-04-11: qty 2

## 2016-04-11 MED ORDER — OXYCODONE HCL 5 MG PO TABS
10.0000 mg | ORAL_TABLET | ORAL | Status: DC | PRN
  Administered 2016-04-12: 10 mg via ORAL
  Filled 2016-04-11: qty 2

## 2016-04-11 MED ORDER — SENNOSIDES-DOCUSATE SODIUM 8.6-50 MG PO TABS
2.0000 | ORAL_TABLET | ORAL | Status: DC
  Administered 2016-04-11 – 2016-04-12 (×2): 2 via ORAL
  Filled 2016-04-11 (×2): qty 2

## 2016-04-11 MED ORDER — ACETAMINOPHEN 325 MG PO TABS: 650.0000 mg | ORAL_TABLET | ORAL | Status: DC | PRN

## 2016-04-11 MED ORDER — TETANUS-DIPHTH-ACELL PERTUSSIS 5-2.5-18.5 LF-MCG/0.5 IM SUSP: 0.5000 mL | Freq: Once | INTRAMUSCULAR | Status: DC

## 2016-04-11 MED ORDER — EPHEDRINE 5 MG/ML INJ
10.0000 mg | INTRAVENOUS | Status: DC | PRN
  Filled 2016-04-11: qty 4

## 2016-04-11 MED ORDER — WITCH HAZEL-GLYCERIN EX PADS: 1.0000 "application " | MEDICATED_PAD | CUTANEOUS | Status: DC | PRN

## 2016-04-11 MED ORDER — PRENATAL MULTIVITAMIN CH: 1.0000 | ORAL_TABLET | Freq: Every day | ORAL | Status: DC

## 2016-04-11 MED ORDER — LACTATED RINGERS IV SOLN: 500.0000 mL | Freq: Once | INTRAVENOUS | Status: DC

## 2016-04-11 MED ORDER — ONDANSETRON HCL 4 MG/2ML IJ SOLN: 4.0000 mg | INTRAMUSCULAR | Status: DC | PRN

## 2016-04-11 MED ORDER — PENICILLIN G POTASSIUM 5000000 UNITS IJ SOLR
2.5000 10*6.[IU] | INTRAVENOUS | Status: DC
  Filled 2016-04-11 (×3): qty 2.5

## 2016-04-11 MED ORDER — IBUPROFEN 600 MG PO TABS: 600.0000 mg | ORAL_TABLET | Freq: Four times a day (QID) | ORAL | Status: DC

## 2016-04-11 MED ORDER — LACTATED RINGERS IV SOLN
INTRAVENOUS | Status: DC
  Administered 2016-04-11: 13:00:00 via INTRAVENOUS

## 2016-04-11 MED ORDER — FENTANYL 2.5 MCG/ML BUPIVACAINE 1/10 % EPIDURAL INFUSION (WH - ANES): 14.0000 mL/h | INTRAMUSCULAR | Status: DC | PRN

## 2016-04-11 MED ORDER — LACTATED RINGERS IV SOLN: 500.0000 mL | INTRAVENOUS | Status: DC | PRN

## 2016-04-11 MED ORDER — DIBUCAINE 1 % RE OINT: 1.0000 "application " | TOPICAL_OINTMENT | RECTAL | Status: DC | PRN

## 2016-04-11 MED ORDER — PENICILLIN G POTASSIUM 5000000 UNITS IJ SOLR
5.0000 10*6.[IU] | Freq: Once | INTRAMUSCULAR | Status: DC
  Filled 2016-04-11: qty 5

## 2016-04-11 MED ORDER — OXYCODONE-ACETAMINOPHEN 5-325 MG PO TABS: 1.0000 | ORAL_TABLET | ORAL | Status: DC | PRN

## 2016-04-11 MED ORDER — FENTANYL CITRATE (PF) 100 MCG/2ML IJ SOLN
50.0000 ug | INTRAMUSCULAR | Status: DC | PRN
  Administered 2016-04-11 (×2): 100 ug via INTRAVENOUS
  Filled 2016-04-11 (×2): qty 2

## 2016-04-11 MED ORDER — ONDANSETRON HCL 4 MG/2ML IJ SOLN: 4.0000 mg | Freq: Four times a day (QID) | INTRAMUSCULAR | Status: DC | PRN

## 2016-04-11 MED ORDER — OXYCODONE HCL 5 MG PO TABS
5.0000 mg | ORAL_TABLET | ORAL | Status: DC | PRN
  Administered 2016-04-11 – 2016-04-12 (×2): 5 mg via ORAL
  Filled 2016-04-11 (×2): qty 1

## 2016-04-11 MED ORDER — BENZOCAINE-MENTHOL 20-0.5 % EX AERO
1.0000 "application " | INHALATION_SPRAY | CUTANEOUS | Status: DC | PRN
  Filled 2016-04-11: qty 56

## 2016-04-11 MED ORDER — PHENYLEPHRINE 40 MCG/ML (10ML) SYRINGE FOR IV PUSH (FOR BLOOD PRESSURE SUPPORT)
80.0000 ug | PREFILLED_SYRINGE | INTRAVENOUS | Status: DC | PRN
  Filled 2016-04-11: qty 5

## 2016-04-11 MED ORDER — OXYCODONE-ACETAMINOPHEN 5-325 MG PO TABS
2.0000 | ORAL_TABLET | ORAL | Status: DC | PRN
  Administered 2016-04-11: 2 via ORAL
  Filled 2016-04-11: qty 2

## 2016-04-11 MED ORDER — ONDANSETRON HCL 4 MG PO TABS: 4.0000 mg | ORAL_TABLET | ORAL | Status: DC | PRN

## 2016-04-11 MED ORDER — OXYCODONE HCL 5 MG PO TABS: 10.0000 mg | ORAL_TABLET | ORAL | Status: DC | PRN

## 2016-04-11 MED ORDER — PRENATAL MULTIVITAMIN CH
1.0000 | ORAL_TABLET | Freq: Every day | ORAL | Status: DC
  Administered 2016-04-12 – 2016-04-13 (×2): 1 via ORAL
  Filled 2016-04-11 (×2): qty 1

## 2016-04-11 MED ORDER — DIPHENHYDRAMINE HCL 50 MG/ML IJ SOLN: 12.5000 mg | INTRAMUSCULAR | Status: DC | PRN

## 2016-04-11 MED ORDER — ONDANSETRON HCL 4 MG/2ML IJ SOLN
4.0000 mg | INTRAMUSCULAR | Status: DC | PRN
  Administered 2016-04-12: 4 mg via INTRAVENOUS
  Filled 2016-04-11: qty 2

## 2016-04-11 MED ORDER — SOD CITRATE-CITRIC ACID 500-334 MG/5ML PO SOLN
30.0000 mL | ORAL | Status: DC | PRN
Start: 2016-04-11 — End: 2016-04-13

## 2016-04-11 MED ORDER — SIMETHICONE 80 MG PO CHEW: 80.0000 mg | CHEWABLE_TABLET | ORAL | Status: DC | PRN

## 2016-04-11 MED ORDER — DIPHENHYDRAMINE HCL 25 MG PO CAPS: 25.0000 mg | ORAL_CAPSULE | Freq: Four times a day (QID) | ORAL | Status: DC | PRN

## 2016-04-11 MED ORDER — OXYCODONE HCL 5 MG PO TABS: 5.0000 mg | ORAL_TABLET | ORAL | Status: DC | PRN

## 2016-04-11 MED ORDER — OXYTOCIN BOLUS FROM INFUSION
500.0000 mL | Freq: Once | INTRAVENOUS | Status: AC
  Administered 2016-04-11: 500 mL via INTRAVENOUS

## 2016-04-11 MED ORDER — IBUPROFEN 600 MG PO TABS
600.0000 mg | ORAL_TABLET | Freq: Four times a day (QID) | ORAL | Status: DC
  Administered 2016-04-11 – 2016-04-13 (×8): 600 mg via ORAL
  Filled 2016-04-11 (×8): qty 1

## 2016-04-11 MED ORDER — SENNOSIDES-DOCUSATE SODIUM 8.6-50 MG PO TABS: 2.0000 | ORAL_TABLET | ORAL | Status: DC

## 2016-04-11 MED ORDER — OXYTOCIN 40 UNITS IN LACTATED RINGERS INFUSION - SIMPLE MED
2.5000 [IU]/h | INTRAVENOUS | Status: DC
  Filled 2016-04-11: qty 1000

## 2016-04-11 MED ORDER — LIDOCAINE HCL (PF) 1 % IJ SOLN
30.0000 mL | INTRAMUSCULAR | Status: DC | PRN
  Filled 2016-04-11: qty 30

## 2016-04-11 MED ORDER — BENZOCAINE-MENTHOL 20-0.5 % EX AERO: 1.0000 "application " | INHALATION_SPRAY | CUTANEOUS | Status: DC | PRN

## 2016-04-11 NOTE — H&P (Addendum)
Natasha Smith is a 25 y.o. female presenting for labor after calling office c/o contractions and leaking fluid.No HSV prodromal sxs.  OB History    Gravida Para Term Preterm AB Living   7 3 3  0 3 3   SAB TAB Ectopic Multiple Live Births   1 1 0 0 3     Past Medical History:  Diagnosis Date  . Abnormal Pap smear   . Headache   . Herpes simplex type II infection   . History of chlamydia infection   . Infection    UTI  . Pregnancy induced hypertension    hx  . Sickle cell trait [facility]HCC)    Past Surgical History:  Procedure Laterality Date  . KNEE SURGERY     Family History: family history includes Asthma in her brother, mother, and sister; Cancer in her maternal aunt; Diabetes in her maternal grandmother; Heart disease in her maternal grandfather; Hypertension in her mother. Social History:  reports that she has quit smoking. She has never used smokeless tobacco. She reports that she does not drink alcohol or use drugs.     Maternal Diabetes: No Genetic Screening: Normal Maternal Ultrasounds/Referrals: Normal except bilateral pyelectasis (Rt-0.5, Lt-0.9cm) Fetal Ultrasounds or other Referrals:  None Maternal Substance Abuse:  No Significant Maternal Medications:  Valtrex secondary to HSV 2 Significant Maternal Lab Results:  Lab values include: Group B Strep negative Other Comments:  None  ROS  Non-contributory History Dilation: 6 Effacement (%): 80 Station: -1 Exam by:: Natasha Smith, RNC Blood pressure 128/88, pulse (!) 121, temperature 99 F (37.2 C), temperature source Axillary, resp. rate 18, last menstrual period 05/23/2015, SpO2 100 %, unknown if currently breastfeeding. Exam Physical Exam  Lungs CTA CV RRR Abd gravid, BT Ext no calf tenderness FHT 140s/reactive/cat 1 Toco q2-403min  Prenatal labs: ABO, Rh:  A + Antibody:   Neg Rubella:  Immune RPR:   NR HBsAg:   Neg HIV: Non Reactive (03/24 1916)  GBS: Neg  Assessment/Plan: P3 at 38 6/7wks presenting  in active labor with SROM.  Fetal status is overall reassuring.  Pain medicine upon request.   Natasha Smith Y 04/11/2016, 1:07 PM

## 2016-04-11 NOTE — Progress Notes (Signed)
Natasha Smith is a 25 y.o. [phone] at 5545w6d admitted for labor and leaking fluid.  Subjective: No complaints.  Denies feeling any pressure.  Reporting continued contractions s/p fentanyl.  Objective: BP 128/88   Pulse (!) 121   Temp 99 F (37.2 C) (Axillary)   Resp 18   LMP 05/23/2015   SpO2 100%  No intake/output data recorded. No intake/output data recorded.  FHT:  FHR: 130s bpm, variability: min-mod,  accelerations:  Present,  decelerations:  Present occas decel (now that membranes are ruptured) UC:   regular, every 2-3 minutes SVE:   Dilation: 6 Effacement (%): 80 Station: -1 Exam by:: Dr Su Hiltoberts AROM - forebag AROM lt mec  Labs: Lab Results  Component Value Date   WBC 16.1 (H) 04/11/2016   HGB 11.1 (L) 04/11/2016   HCT 33.0 (L) 04/11/2016   MCV 68.2 (L) 04/11/2016   PLT 146 (L) 04/11/2016    Assessment / Plan: Labor  Labor: AROM'd now but if not change after another hour, will consider augmentation with pitocin Preeclampsia:  no signs or symptoms of toxicity Fetal Wellbeing:  Category I and Category II Pain Control:  IV pain meds I/D:  GBS neg Anticipated MOD:  NSVD  Natasha Smith Y 04/11/2016, 2:43 PM

## 2016-04-11 NOTE — Anesthesia Pain Management Evaluation Note (Signed)
  CRNA Pain Management Visit Note  Patient: Natasha Smith, 25 y.o., female  "Hello [facility] am a [facility]. We have an anesthesia team available at all times to provide care throughout the hospital, including epidural management and anesthesia for C-section. I don't know your plan for the delivery whether it a natural birth, water birth, IV sedation, nitrous supplementation, doula or epidural, but we want to meet your pain goals."   1.Was your pain managed to your expectations on prior hospitalizations?   Yes   2.What is your expectation for pain management during this hospitalization?     IV pain meds  3.How can we help you reach that goal? RN nursing comfort measures, patient decision to use IV medication at this time.  Record the patient's initial score and the patient's pain goal.   Pain: 10  Pain Goal: 10 [facility] wants you to be able to say your pain was always managed very well.  [facility],Natasha Smith 04/11/2016

## 2016-04-11 NOTE — MAU Note (Signed)
Contractions started this morning, pt does not think her water broke.  Denies vaginal bleeding.  Reports 4th baby, all vaginal deliveries.  Had preE with last pregnancy.

## 2016-04-12 LAB — CBC
HCT: 25 % — ABNORMAL LOW (ref 36.0–46.0)
HEMATOCRIT: 28.3 % — AB (ref 36.0–46.0)
Hemoglobin: 9.8 g/dL — ABNORMAL LOW (ref 12.0–15.0)
Hemoglobin: 8.3 g/dL — ABNORMAL LOW (ref 12.0–15.0)
MCH: 23 pg — ABNORMAL LOW (ref 26.0–34.0)
MCH: 22.5 pg — AB (ref 26.0–34.0)
MCHC: 34.6 g/dL (ref 30.0–36.0)
MCHC: 33.2 g/dL (ref 30.0–36.0)
MCV: 66.4 fL — ABNORMAL LOW (ref 78.0–100.0)
MCV: 67.8 fL — ABNORMAL LOW (ref 78.0–100.0)
Platelets: 215 10*3/uL (ref 150–400)
Platelets: 156 10*3/uL (ref 150–400)
RBC: 4.26 MIL/uL (ref 3.87–5.11)
RBC: 3.69 MIL/uL — AB (ref 3.87–5.11)
RDW: 16.3 % — AB (ref 11.5–15.5)
RDW: 16.1 % — ABNORMAL HIGH (ref 11.5–15.5)
WBC: 24.9 10*3/uL — AB (ref 4.0–10.5)
WBC: 20.9 10*3/uL — AB (ref 4.0–10.5)

## 2016-04-12 LAB — DIC (DISSEMINATED INTRAVASCULAR COAGULATION)PANEL
Fibrinogen: 378 mg/dL (ref 210–475)
Prothrombin Time: 13.8 seconds (ref 11.4–15.2)
aPTT: 28 seconds (ref 24–36)

## 2016-04-12 LAB — DIC (DISSEMINATED INTRAVASCULAR COAGULATION) PANEL
D DIMER QUANT: 1.93 ug{FEU}/mL — AB (ref 0.00–0.50)
INR: 1.06
PLATELETS: 215 10*3/uL (ref 150–400)
SMEAR REVIEW: NONE SEEN

## 2016-04-12 LAB — RPR: RPR Ser Ql: NONREACTIVE

## 2016-04-12 LAB — POSTPARTUM HEMORRHAGE PROTOCOL (BB NOTIFICATION)

## 2016-04-12 MED ORDER — METHYLERGONOVINE MALEATE 0.2 MG/ML IJ SOLN
0.2000 mg | Freq: Once | INTRAMUSCULAR | Status: AC
  Administered 2016-04-12: 0.2 mg via INTRAMUSCULAR

## 2016-04-12 MED ORDER — METHYLERGONOVINE MALEATE 0.2 MG PO TABS: 0.2000 mg | ORAL_TABLET | Freq: Three times a day (TID) | ORAL | Status: DC

## 2016-04-12 MED ORDER — METHYLERGONOVINE MALEATE 0.2 MG PO TABS
0.2000 mg | ORAL_TABLET | Freq: Four times a day (QID) | ORAL | Status: AC
  Administered 2016-04-12 (×4): 0.2 mg via ORAL
  Filled 2016-04-12 (×4): qty 1

## 2016-04-12 MED ORDER — MISOPROSTOL 200 MCG PO TABS
800.0000 ug | ORAL_TABLET | Freq: Once | ORAL | Status: AC
  Administered 2016-04-12: 800 ug via RECTAL

## 2016-04-12 MED ORDER — OXYTOCIN 40 UNITS IN LACTATED RINGERS INFUSION - SIMPLE MED
INTRAVENOUS | Status: AC
  Filled 2016-04-12: qty 1000

## 2016-04-12 MED ORDER — LACTATED RINGERS IV SOLN: INTRAVENOUS | Status: DC

## 2016-04-12 MED ORDER — MISOPROSTOL 200 MCG PO TABS
ORAL_TABLET | ORAL | Status: AC
  Filled 2016-04-12: qty 4

## 2016-04-12 MED ORDER — METHYLERGONOVINE MALEATE 0.2 MG/ML IJ SOLN
INTRAMUSCULAR | Status: AC
  Filled 2016-04-12: qty 1

## 2016-04-12 MED ORDER — OXYTOCIN 40 UNITS IN LACTATED RINGERS INFUSION - SIMPLE MED
10.0000 [IU]/h | INTRAVENOUS | Status: DC
  Administered 2016-04-12: 10 [IU]/h via INTRAVENOUS

## 2016-04-12 NOTE — Progress Notes (Signed)
   04/12/16 0127  Hourly Rounding  Assessment (code hemmorhage called )  Fundal Assessment  Fundal Tone Boggy  Fundal Position Midline  Fundus 2/U  Lochia  Color Rubra  Amount Large  Odor None  Clots Large  Clot Size 400  Neurological  Neuro (WDL) X  Orientation Level Oriented X4  Speech Clear  Neuro Symptoms (c/o feeling dizzy and faint )   Pt called RN to room c/o feeling like she was going to pass out. Fudus 2 above umbilicus midline and boggy, large clot expressed- 400ml of blood and clots on pad. Code hemmorhage called at 0127. IV pitocin started. Team to room with Dr. Vergie LivingPickens, Bernerd PhoNancy Prothero, CNM to room. Foley placed and bladder emptied. Methergine 0.2mg  IM given @ 0142, Cytotec 800mh rectally given @ 0144. Dr. Vergie LivingPickens preformed manual exam. 700ml more blood and clots measured. Total EBL 1100ml, Pt stable, uterus firm U-1 small, VSS. Will continue to monitor pt.

## 2016-04-12 NOTE — Progress Notes (Signed)
Post Partum Day 1 Subjective:  Well. Lochia are normal. Voiding, ambulating, tolerating normal diet. nursing going well. Feeling good but uncomfortable with Foley. Denies symptoms of anemia  Objective: Blood pressure 113/67, pulse 81, temperature 98.1 F (36.7 C), resp. rate 18, last menstrual period 05/23/2015, SpO2 100 %, unknown if currently breastfeeding.  Physical Exam:  General: normal Lochia: appropriate Uterine Fundus: 0/2 firm non-tender  Extremities: No evidence of DVT seen on physical exam. Edema minimal     Recent Labs  04/12/16 0139 04/12/16 0543  HGB 9.8* 8.3*  HCT 28.3* 25.0*    Assessment/Plan: Normal Post-partum. Continue routine post-partum care. Anticipate discharge tomorrow  Remove foley, saline lock IV and obtain orthostatics to evaluate need for transfusion   LOS: 1 day   Naryiah Schley A MD 04/12/2016, 9:17 AM

## 2016-04-12 NOTE — Lactation Note (Signed)
This note was copied from a baby's chart. Lactation Consultation Note  Patient Name: Natasha Smith Reason for consult: Initial assessment   Initial consult with Exp BF mom of 20 hour old infant. Infant with 2 BF for 15-60 minutes, 5 bottle feeds of 10-23 cc, 1 void, 3 stools, and 1 emesis since birth. LATCH Scores 7-8 by bedside RN. Mom with PPH with 1100 cc EBL.   Mom reports she plans to bottle feed as she is busy with other children and will be returning to work. Mom reports she BF her 1 yo for 1 month. Mom feels this infant likes BF better than formula. Discussed benefits of BF and risks of formula with mom. Mom reports she does not need assistance with latch at this time.   Discussed supply and demand, milk coming to volume and colostrum. Enc mom to BF prior to infant receiving formula to stimulate a good milk supply. Offered mom a DEBP and she declined, she wants to use a hand pump. Hand pump was given with instructions for pumping, cleaning, assembling and disassembling pump. Enc mom to pump every 2-3 hours to stimulate a supply. Discussed with mom that with history of PPH, pumping with DEBP would be in her best interest if she wishes to establish a supply.   BF Resources Handout and LC Brochure given, informed of BF Support Groups, IP/OP Services, and LC phone #. Enc mom to call with questions/concerns prn. Follow up tomorrow and prn.    Maternal Data Formula Feeding for Exclusion: Yes Reason for exclusion: Mother's choice to formula feed on admision Does the patient have breastfeeding experience prior to this delivery?: Yes  Feeding Feeding Type: Bottle Fed - Formula Nipple Type: Slow - flow  LATCH Score/Interventions                      Lactation Tools Discussed/Used WIC Program: Yes Pump Review: Milk Storage;Setup, frequency, and cleaning   Consult Status Consult Status: Follow-up Date: 04/13/16 Follow-up type:  In-patient    Natasha Smith Smith, 12:05 PM

## 2016-04-12 NOTE — Progress Notes (Signed)
Notified Dr. Estanislado Pandyivard of patient's orthostatic vital signs. Patient ambulated to the bathroom without any problems, Foley catheter was discontinued, patient sat in chair while bed was changed and tolerated it well, then assisted back to bed. Will recheck orthostatic vital signs again this afternoon after patient eats and drinks.

## 2016-04-12 NOTE — Progress Notes (Signed)
   04/12/16 0111  Hourly Rounding  Assessment Alert  Intervention Call light w/in reach;Comfort measures;Environment secured;Family sitting w/ patient;Pain assessed  Pain Assessment  Pain Assessment 0-10  Pain Score 3  Pain Location Head  Pain Descriptors / Indicators Headache  Pain Frequency Intermittent  Pain Onset On-going  Pain Intervention(s) Emotional support  Multiple Pain Sites No  Fundal Assessment  Fundal Tone Firm  Fundal Position Midline  Fundus U/1  Lochia  Color Rubra  Amount Moderate  Odor None  Clots (4 golf ball sized clots )  Estimated Blood Loss 89   Pt called out to nurses station for pads, when NT entered room pt on toilet and states that she passed a some clots. Pt emptyied bladder and helped back to bed. RN to room fundus is firm U-1 bleeding is moderate. 4 golf ball sized clots in bathroom with a moderate amt on pad, total EBL 89ml.

## 2016-04-12 NOTE — Progress Notes (Signed)
Dr Su Hiltoberts notified of vomiting approx 1000 cc of digested food and water   Output in foley only 50cc   Will start fluids as ordered

## 2016-04-12 NOTE — Progress Notes (Signed)
[facility] P[facility]#1 svd/intact perineum with h/o prior PPHs and otherwise uncomplicated hx. Pt was getting up to go to the bathroom and had clots and bleeding. When I arrived, RNs and staff were already present and IV pitocin was running and fundal massage being done. I ordered a CBC and foley placed, with uterus felt to be a little below the umbilicus and deviated to the right. Foley placed for not much UOP and clots and bleeding still noted and by this time, the covering CNM for the group had arrived. Methergine ordered and bimanual massage done and clots removed from the LUS and fundus became midline and about 6cm below the umbilicus and the LUS felt firm, with methergine ordered for additional uterotonic measures. VB and clots stopped and uterus remained firm with most recent VS with normal BP in the 120s/70s and only slight tachy in the 100s. Recommended to CNM to trend H/H since total EBL approx 1500 and if bleeding comes back would recommend Bakri balloon placement  Cornelia Copaharlie Yumiko Alkins, Jr MD Attending Center [facility](Faculty Practice)

## 2016-04-12 NOTE — Progress Notes (Signed)
Pt is a 25yo [id number] with SVD of viable female infant at [id number].  Pt had a PPH with 1100cc EBL.  Pt was was given Methergine .2mg  IM  Pitocin IVPB with a 500cc bolus than 125 per hour. 800mcg placed rectally.  Uterus was firm and pt was stable with normal Vs.  CBC results were 9.8/ 11.1.  2UPRBC on hold.  Will redraw a hgb in am.  Will put pt on Methergine protocol.

## 2016-04-12 NOTE — Progress Notes (Signed)
Notified Dr. Estanislado Smith of patient's orthostatic vital signs. Patient was able to ambulate to the bathroom without any difficulties and voided 200ml.

## 2016-04-13 MED ORDER — FERROUS SULFATE 325 (65 FE) MG PO TABS: 325.0000 mg | ORAL_TABLET | Freq: Two times a day (BID) | ORAL | 3 refills | Status: DC

## 2016-04-13 MED ORDER — IBUPROFEN 600 MG PO TABS: 600.0000 mg | ORAL_TABLET | Freq: Four times a day (QID) | ORAL | 2 refills | Status: DC | PRN

## 2016-04-13 MED ORDER — MEDROXYPROGESTERONE ACETATE 150 MG/ML IM SUSP
150.0000 mg | Freq: Once | INTRAMUSCULAR | Status: AC
  Administered 2016-04-13: 150 mg via INTRAMUSCULAR
  Filled 2016-04-13: qty 1

## 2016-04-13 NOTE — Discharge Instructions (Signed)
Breastfeeding and Mastitis Mastitis is inflammation of the breast tissue. It can occur in women who are breastfeeding. This can make breastfeeding painful. Mastitis will sometimes go away on its own. Your health care provider will help determine if treatment is needed. CAUSES Mastitis is often associated with a blocked milk (lactiferous) duct. This can happen when too much milk builds up in the breast. Causes of excess milk in the breast can include:  Poor latch-on. If your baby is not latched onto the breast properly, she or he may not empty your breast completely while breastfeeding.  Allowing too much time to pass between feedings.  Wearing a bra or other clothing that is too tight. This puts extra pressure on the lactiferous ducts so milk does not flow through them as it should. Mastitis can also be caused by a bacterial infection. Bacteria may enter the breast tissue through cuts or openings in the skin. In women who are breastfeeding, this may occur because of cracked or irritated skin. Cracks in the skin are often caused when your baby does not latch on properly to the breast. SIGNS AND SYMPTOMS  Swelling, redness, tenderness, and pain in an area of the breast.  Swelling of the glands under the arm on the same side.  Fever may or may not accompany mastitis. If an infection is allowed to progress, a collection of pus (abscess) may develop. DIAGNOSIS  Your health care provider can usually diagnose mastitis based on your symptoms and a physical exam. Tests may be done to help confirm the diagnosis. These may include:  Removal of pus from the breast by applying pressure to the area. This pus can be examined in the lab to determine which bacteria are present. If an abscess has developed, the fluid in the abscess can be removed with a needle. This can also be used to confirm the diagnosis and determine the bacteria present. In most cases, pus will not be present.  Blood tests to determine if  your body is fighting a bacterial infection.  Mammogram or ultrasound tests to rule out other problems or diseases. TREATMENT  Mastitis that occurs with breastfeeding will sometimes go away on its own. Your health care provider may choose to wait 24 hours after first seeing you to decide whether a prescription medicine is needed. If your symptoms are worse after 24 hours, your health care provider will likely prescribe an antibiotic medicine to treat the mastitis. He or she will determine which bacteria are most likely causing the infection and will then select an appropriate antibiotic medicine. This is sometimes changed based on the results of tests performed to identify the bacteria, or if there is no response to the antibiotic medicine selected. Antibiotic medicines are usually given by mouth. You may also be given medicine for pain. HOME CARE INSTRUCTIONS  Only take over-the-counter or prescription medicines for pain, fever, or discomfort as directed by your health care provider.  If your health care provider prescribed an antibiotic medicine, take the medicine as directed. Make sure you finish it even if you start to feel better.  Do not wear a tight or underwire bra. Wear a soft, supportive bra.  Increase your fluid intake, especially if you have a fever.  Continue to empty the breast. Your health care provider can tell you whether this milk is safe for your infant or needs to be thrown out. You may be told to stop nursing until your health care provider thinks it is safe for your baby.  Use a breast pump if you are advised to stop nursing.  Keep your nipples clean and dry.  Empty the first breast completely before going to the other breast. If your baby is not emptying your breasts completely for some reason, use a breast pump to empty your breasts.  If you go back to work, pump your breasts while at work to stay in time with your nursing schedule.  Avoid allowing your breasts to become  overly filled with milk (engorged). SEEK MEDICAL CARE IF:  You have pus-like discharge from the breast.  Your symptoms do not improve with the treatment prescribed by your health care provider within 2 days. SEEK IMMEDIATE MEDICAL CARE IF:  Your pain and swelling are getting worse.  You have pain that is not controlled with medicine.  You have a red line extending from the breast toward your armpit.  You have a fever or persistent symptoms for more than 2-3 days.  You have a fever and your symptoms suddenly get worse. MAKE SURE YOU:   Understand these instructions.  Will watch your condition.  Will get help right away if you are not doing well or get worse.   This information is not intended to replace advice given to you by your health care provider. Make sure you discuss any questions you have with your health care provider.   Document Released: 10/22/2004 Document Revised: 07/02/2013 Document Reviewed: 01/31/2013 Elsevier Interactive Patient Education [employer]. Breastfeeding Deciding to breastfeed is one of the best choices you can make for you and your baby. A change in hormones during pregnancy causes your breast tissue to grow and increases the number and size of your milk ducts. These hormones also allow proteins, sugars, and fats from your blood supply to make breast milk in your milk-producing glands. Hormones prevent breast milk from being released before your baby is born as well as prompt milk flow after birth. Once breastfeeding has begun, thoughts of your baby, as well as his or her sucking or crying, can stimulate the release of milk from your milk-producing glands.  BENEFITS OF BREASTFEEDING For Your Baby  Your first milk (colostrum) helps your baby's digestive system function better.  There are antibodies in your milk that help your baby fight off infections.  Your baby has a lower incidence of asthma, allergies, and sudden infant death  syndrome.  The nutrients in breast milk are better for your baby than infant formulas and are designed uniquely for your baby's needs.  Breast milk improves your baby's brain development.  Your baby is less likely to develop other conditions, such as childhood obesity, asthma, or type 2 diabetes mellitus. For You  Breastfeeding helps to create a very special bond between you and your baby.  Breastfeeding is convenient. Breast milk is always available at the correct temperature and costs nothing.  Breastfeeding helps to burn calories and helps you lose the weight gained during pregnancy.  Breastfeeding makes your uterus contract to its prepregnancy size faster and slows bleeding (lochia) after you give birth.   Breastfeeding helps to lower your risk of developing type 2 diabetes mellitus, osteoporosis, and breast or ovarian cancer later in life. SIGNS THAT YOUR BABY IS HUNGRY Early Signs of Hunger  Increased alertness or activity.  Stretching.  Movement of the head from side to side.  Movement of the head and opening of the mouth when the corner of the mouth or cheek is stroked (rooting).  Increased sucking sounds, smacking lips, cooing,  sighing, or squeaking.  Hand-to-mouth movements.  Increased sucking of fingers or hands. Late Signs of Hunger  Fussing.  Intermittent crying. Extreme Signs of Hunger Signs of extreme hunger will require calming and consoling before your baby will be able to breastfeed successfully. Do not wait for the following signs of extreme hunger to occur before you initiate breastfeeding:  Restlessness.  A loud, strong cry.  Screaming. BREASTFEEDING BASICS Breastfeeding Initiation  Find a comfortable place to sit or lie down, with your neck and back well supported.  Place a pillow or rolled up blanket under your baby to bring him or her to the level of your breast (if you are seated). Nursing pillows are specially designed to help support  your arms and your baby while you breastfeed.  Make sure that your baby's abdomen is facing your abdomen.  Gently massage your breast. With your fingertips, massage from your chest wall toward your nipple in a circular motion. This encourages milk flow. You may need to continue this action during the feeding if your milk flows slowly.  Support your breast with 4 fingers underneath and your thumb above your nipple. Make sure your fingers are well away from your nipple and your baby's mouth.  Stroke your baby's lips gently with your finger or nipple.  When your baby's mouth is open wide enough, quickly bring your baby to your breast, placing your entire nipple and as much of the colored area around your nipple (areola) as possible into your baby's mouth.  More areola should be visible above your baby's upper lip than below the lower lip.  Your baby's tongue should be between his or her lower gum and your breast.  Ensure that your baby's mouth is correctly positioned around your nipple (latched). Your baby's lips should create a seal on your breast and be turned out (everted).  It is common for your baby to suck about 2-3 minutes in order to start the flow of breast milk. Latching Teaching your baby how to latch on to your breast properly is very important. An improper latch can cause nipple pain and decreased milk supply for you and poor weight gain in your baby. Also, if your baby is not latched onto your nipple properly, he or she may swallow some air during feeding. This can make your baby fussy. Burping your baby when you switch breasts during the feeding can help to get rid of the air. However, teaching your baby to latch on properly is still the best way to prevent fussiness from swallowing air while breastfeeding. Signs that your baby has successfully latched on to your nipple:  Silent tugging or silent sucking, without causing you pain.  Swallowing heard between every 3-4  sucks.  Muscle movement above and in front of his or her ears while sucking. Signs that your baby has not successfully latched on to nipple:  Sucking sounds or smacking sounds from your baby while breastfeeding.  Nipple pain. If you think your baby has not latched on correctly, slip your finger into the corner of your baby's mouth to break the suction and place it between your baby's gums. Attempt breastfeeding initiation again. Signs of Successful Breastfeeding Signs from your baby:  A gradual decrease in the number of sucks or complete cessation of sucking.  Falling asleep.  Relaxation of his or her body.  Retention of a small amount of milk in his or her mouth.  Letting go of your breast by himself or herself. Signs from you:  Breasts that have increased in firmness, weight, and size 1-3 hours after feeding.  Breasts that are softer immediately after breastfeeding.  Increased milk volume, as well as a change in milk consistency and color by the fifth day of breastfeeding.  Nipples that are not sore, cracked, or bleeding. Signs That Your [employer] is Getting Enough Milk  Wetting at least 3 diapers in a 24-hour period. The urine should be clear and pale yellow by age 66 days.  At least 3 stools in a 24-hour period by age 66 days. The stool should be soft and yellow.  At least 3 stools in a 24-hour period by age 80 days. The stool should be seedy and yellow.  No loss of weight greater than 10% of birth weight during the first 1 days of age.  Average weight gain of 4-7 ounces (113-198 g) per week after age 30 days.  Consistent daily weight gain by age 82 days, without weight loss after the age of 2 weeks. After a feeding, your baby may spit up a small amount. This is common. BREASTFEEDING FREQUENCY AND DURATION Frequent feeding will help you make more milk and can prevent sore nipples and breast engorgement. Breastfeed when you feel the need to reduce the fullness of your breasts or  when your baby shows signs of hunger. This is called "breastfeeding on demand." Avoid introducing a pacifier to your baby while you are working to establish breastfeeding (the first 4-6 weeks after your baby is born). After this time you may choose to use a pacifier. Research has shown that pacifier use during the first year of a baby's life decreases the risk of sudden infant death syndrome (SIDS). Allow your baby to feed on each breast as long as he or she wants. Breastfeed until your baby is finished feeding. When your baby unlatches or falls asleep while feeding from the first breast, offer the second breast. Because newborns are often sleepy in the first few weeks of life, you may need to awaken your baby to get him or her to feed. Breastfeeding times will vary from baby to baby. However, the following rules can serve as a guide to help you ensure that your baby is properly fed:  Newborns (babies 82 weeks of age or younger) may breastfeed every 1-3 hours.  Newborns should not go longer than 3 hours during the day or 5 hours during the night without breastfeeding.  You should breastfeed your baby a minimum of 8 times in a 24-hour period until you begin to introduce solid foods to your baby at around 56 months of age. BREAST MILK PUMPING Pumping and storing breast milk allows you to ensure that your baby is exclusively fed your breast milk, even at times when you are unable to breastfeed. This is especially important if you are going back to work while you are still breastfeeding or when you are not able to be present during feedings. Your lactation consultant can give you guidelines on how long it is safe to store breast milk. A breast pump is a machine that allows you to pump milk from your breast into a sterile bottle. The pumped breast milk can then be stored in a refrigerator or freezer. Some breast pumps are operated by hand, while others use electricity. Ask your lactation consultant which type  will work best for you. Breast pumps can be purchased, but some hospitals and breastfeeding support groups lease breast pumps on a monthly basis. A lactation consultant can teach you  how to hand express breast milk, if you prefer not to use a pump. CARING FOR YOUR BREASTS WHILE YOU BREASTFEED Nipples can become dry, cracked, and sore while breastfeeding. The following recommendations can help keep your breasts moisturized and healthy:  Avoid using soap on your nipples.  Wear a supportive bra. Although not required, special nursing bras and tank tops are designed to allow access to your breasts for breastfeeding without taking off your entire bra or top. Avoid wearing underwire-style bras or extremely tight bras.  Air dry your nipples for 3-75mnutes after each feeding.  Use only cotton bra pads to absorb leaked breast milk. Leaking of breast milk between feedings is normal.  Use lanolin on your nipples after breastfeeding. Lanolin helps to maintain your skin's normal moisture barrier. If you use pure lanolin, you do not need to wash it off before feeding your baby again. Pure lanolin is not toxic to your baby. You may also hand express a few drops of breast milk and gently massage that milk into your nipples and allow the milk to air dry. In the first few weeks after giving birth, some women experience extremely full breasts (engorgement). Engorgement can make your breasts feel heavy, warm, and tender to the touch. Engorgement peaks within 3-5 days after you give birth. The following recommendations can help ease engorgement:  Completely empty your breasts while breastfeeding or pumping. You may want to start by applying warm, moist heat (in the shower or with warm water-soaked hand towels) just before feeding or pumping. This increases circulation and helps the milk flow. If your baby does not completely empty your breasts while breastfeeding, pump any extra milk after he or she is finished.  Wear  a snug bra (nursing or regular) or tank top for 1-2 days to signal your body to slightly decrease milk production.  Apply ice packs to your breasts, unless this is too uncomfortable for you.  Make sure that your baby is latched on and positioned properly while breastfeeding. If engorgement persists after 48 hours of following these recommendations, contact your health care provider or a lScience writer OVERALL HEALTH CARE RECOMMENDATIONS WHILE BREASTFEEDING  Eat healthy foods. Alternate between meals and snacks, eating 3 of each per day. Because what you eat affects your breast milk, some of the foods may make your baby more irritable than usual. Avoid eating these foods if you are sure that they are negatively affecting your baby.  Drink milk, fruit juice, and water to satisfy your thirst (about 10 glasses a day).  Rest often, relax, and continue to take your prenatal vitamins to prevent fatigue, stress, and anemia.  Continue breast self-awareness checks.  Avoid chewing and smoking tobacco. Chemicals from cigarettes that pass into breast milk and exposure to secondhand smoke may harm your baby.  Avoid alcohol and drug use, including marijuana. Some medicines that may be harmful to your baby can pass through breast milk. It is important to ask your health care provider before taking any medicine, including all over-the-counter and prescription medicine as well as vitamin and herbal supplements. It is possible to become pregnant while breastfeeding. If birth control is desired, ask your health care provider about options that will be safe for your baby. SEEK MEDICAL CARE IF:  You feel like you want to stop breastfeeding or have become frustrated with breastfeeding.  You have painful breasts or nipples.  Your nipples are cracked or bleeding.  Your breasts are red, tender, or warm.  You have a  swollen area on either breast.  You have a fever or chills.  You have nausea or  vomiting.  You have drainage other than breast milk from your nipples.  Your breasts do not become full before feedings by the fifth day after you give birth.  You feel sad and depressed.  Your baby is too sleepy to eat well.  Your baby is having trouble sleeping.   Your baby is wetting less than 3 diapers in a 24-hour period.  Your baby has less than 3 stools in a 24-hour period.  Your baby's skin or the white part of his or her eyes becomes yellow.   Your baby is not gaining weight by 17 days of age. SEEK IMMEDIATE MEDICAL CARE IF:  Your baby is overly tired (lethargic) and does not want to wake up and feed.  Your baby develops an unexplained fever.   This information is not intended to replace advice given to you by your health care provider. Make sure you discuss any questions you have with your health care provider.   Document Released: 06/27/2005 Document Revised: 03/18/2015 Document Reviewed: 12/19/2012 Elsevier Interactive Patient Education [employer]. Contraception Choices Contraception (birth control) is the use of any methods or devices to prevent pregnancy. Below are some methods to help avoid pregnancy. HORMONAL METHODS   Contraceptive implant. This is a thin, plastic tube containing progesterone hormone. It does not contain estrogen hormone. Your health care provider inserts the tube in the inner part of the upper arm. The tube can remain in place for up to 3 years. After 3 years, the implant must be removed. The implant prevents the ovaries from releasing an egg (ovulation), thickens the cervical mucus to prevent sperm from entering the uterus, and thins the lining of the inside of the uterus.  Progesterone-only injections. These injections are given every 3 months by your health care provider to prevent pregnancy. This synthetic progesterone hormone stops the ovaries from releasing eggs. It also thickens cervical mucus and changes the uterine lining. This  makes it harder for sperm to survive in the uterus.  Birth control pills. These pills contain estrogen and progesterone hormone. They work by preventing the ovaries from releasing eggs (ovulation). They also cause the cervical mucus to thicken, preventing the sperm from entering the uterus. Birth control pills are prescribed by a health care provider.Birth control pills can also be used to treat heavy periods.  Minipill. This type of birth control pill contains only the progesterone hormone. They are taken every day of each month and must be prescribed by your health care provider.  Birth control patch. The patch contains hormones similar to those in birth control pills. It must be changed once a week and is prescribed by a health care provider.  Vaginal ring. The ring contains hormones similar to those in birth control pills. It is left in the vagina for 3 weeks, removed for 1 week, and then a new one is put back in place. The patient must be comfortable inserting and removing the ring from the vagina.A health care provider's prescription is necessary.  Emergency contraception. Emergency contraceptives prevent pregnancy after unprotected sexual intercourse. This pill can be taken right after sex or up to 5 days after unprotected sex. It is most effective the sooner you take the pills after having sexual intercourse. Most emergency contraceptive pills are available without a prescription. Check with your pharmacist. Do not use emergency contraception as your only form of birth control. BARRIER METHODS  Female condom. This is a thin sheath (latex or rubber) that is worn over the penis during sexual intercourse. It can be used with spermicide to increase effectiveness.  Female condom. This is a soft, loose-fitting sheath that is put into the vagina before sexual intercourse.  Diaphragm. This is a soft, latex, dome-shaped barrier that must be fitted by a health care provider. It is inserted into the  vagina, along with a spermicidal jelly. It is inserted before intercourse. The diaphragm should be left in the vagina for 6 to 8 hours after intercourse.  Cervical cap. This is a round, soft, latex or plastic cup that fits over the cervix and must be fitted by a health care provider. The cap can be left in place for up to 48 hours after intercourse.  Sponge. This is a soft, circular piece of polyurethane foam. The sponge has spermicide in it. It is inserted into the vagina after wetting it and before sexual intercourse.  Spermicides. These are chemicals that kill or block sperm from entering the cervix and uterus. They come in the form of creams, jellies, suppositories, foam, or tablets. They do not require a prescription. They are inserted into the vagina with an applicator before having sexual intercourse. The process must be repeated every time you have sexual intercourse. INTRAUTERINE CONTRACEPTION  Intrauterine device (IUD). This is a T-shaped device that is put in a woman's uterus during a menstrual period to prevent pregnancy. There are 2 types:  Copper IUD. This type of IUD is wrapped in copper wire and is placed inside the uterus. Copper makes the uterus and fallopian tubes produce a fluid that kills sperm. It can stay in place for 10 years.  Hormone IUD. This type of IUD contains the hormone progestin (synthetic progesterone). The hormone thickens the cervical mucus and prevents sperm from entering the uterus, and it also thins the uterine lining to prevent implantation of a fertilized egg. The hormone can weaken or kill the sperm that get into the uterus. It can stay in place for 3-5 years, depending on which type of IUD is used. PERMANENT METHODS OF CONTRACEPTION  Female tubal ligation. This is when the woman's fallopian tubes are surgically sealed, tied, or blocked to prevent the egg from traveling to the uterus.  Hysteroscopic sterilization. This involves placing a small coil or  insert into each fallopian tube. Your doctor uses a technique called hysteroscopy to do the procedure. The device causes scar tissue to form. This results in permanent blockage of the fallopian tubes, so the sperm cannot fertilize the egg. It takes about 3 months after the procedure for the tubes to become blocked. You must use another form of birth control for these 3 months.  Female sterilization. This is when the female has the tubes that carry sperm tied off (vasectomy).This blocks sperm from entering the vagina during sexual intercourse. After the procedure, the man can still ejaculate fluid (semen). NATURAL PLANNING METHODS  Natural family planning. This is not having sexual intercourse or using a barrier method (condom, diaphragm, cervical cap) on days the woman could become pregnant.  Calendar method. This is keeping track of the length of each menstrual cycle and identifying when you are fertile.  Ovulation method. This is avoiding sexual intercourse during ovulation.  Symptothermal method. This is avoiding sexual intercourse during ovulation, using a thermometer and ovulation symptoms.  Post-ovulation method. This is timing sexual intercourse after you have ovulated. Regardless of which type or method of contraception you choose,  it is important that you use condoms to protect against the transmission of sexually transmitted infections (STIs). Talk with your health care provider about which form of contraception is most appropriate for you.   This information is not intended to replace advice given to you by your health care provider. Make sure you discuss any questions you have with your health care provider.   Document Released: 06/27/2005 Document Revised: 07/02/2013 Document Reviewed: 12/20/2012 Elsevier Interactive Patient Education 2016 [employer]. Iron-Rich Diet Iron is a mineral that helps your body to produce hemoglobin. Hemoglobin is a protein in your red blood cells that  carries oxygen to your body's tissues. Eating too little iron may cause you to feel weak and tired, and it can increase your risk for infection. Eating enough iron is necessary for your body's metabolism, muscle function, and nervous system. Iron is naturally found in many foods. It can also be added to foods or fortified in foods. There are two types of dietary iron:  Heme iron. Heme iron is absorbed by the body more easily than nonheme iron. Heme iron is found in meat, poultry, and fish.  Nonheme iron. Nonheme iron is found in dietary supplements, iron-fortified grains, beans, and vegetables. You may need to follow an iron-rich diet if:  You have been diagnosed with iron deficiency or iron-deficiency anemia.  You have a condition that prevents you from absorbing dietary iron, such as:  Infection in your intestines.  Celiac disease. This involves long-lasting (chronic) inflammation of your intestines.  You do not eat enough iron.  You eat a diet that is high in foods that impair iron absorption.  You have lost a lot of blood.  You have heavy bleeding during your menstrual cycle.  You are pregnant. WHAT IS MY PLAN? Your health care provider may help you to determine how much iron you need per day based on your condition. Generally, when a person consumes sufficient amounts of iron in the diet, the following iron needs are met:  Men.  69-74 years old: 11 mg per day.  69-46 years old: 8 mg per day.  Women.   40-87 years old: 15 mg per day.  18-56 years old: 18 mg per day.  Over 14 years old: 8 mg per day.  Pregnant women: 27 mg per day.  Breastfeeding women: 9 mg per day. WHAT DO I NEED TO KNOW ABOUT AN IRON-RICH DIET?  Eat fresh fruits and vegetables that are high in vitamin C along with foods that are high in iron. This will help increase the amount of iron that your body absorbs from food, especially with foods containing nonheme iron. Foods that are high in vitamin C  include oranges, peppers, tomatoes, and mango.  Take iron supplements only as directed by your health care provider. Overdose of iron can be life-threatening. If you were prescribed iron supplements, take them with orange juice or a vitamin C supplement.  Cook foods in pots and pans that are made from iron.   Eat nonheme iron-containing foods alongside foods that are high in heme iron. This helps to improve your iron absorption.   Certain foods and drinks contain compounds that impair iron absorption. Avoid eating these foods in the same meal as iron-rich foods or with iron supplements. These include:  Coffee, black tea, and red wine.  Milk, dairy products, and foods that are high in calcium.  Beans, soybeans, and peas.  Whole grains.  When eating foods that contain both nonheme iron and  compounds that impair iron absorption, follow these tips to absorb iron better.   Soak beans overnight before cooking.  Soak whole grains overnight and drain them before using.  Ferment flours before baking, such as using yeast in bread dough. WHAT FOODS CAN I EAT? Grains Iron-fortified breakfast cereal. Iron-fortified whole-wheat bread. Enriched rice. Sprouted grains. Vegetables Spinach. Potatoes with skin. Green peas. Broccoli. Red and green bell peppers. Fermented vegetables. Fruits Prunes. Raisins. Oranges. Strawberries. Mango. Grapefruit. Meats and Other Protein Sources Beef liver. Oysters. Beef. Shrimp. [place]. Chicken. [place]. Sardines. Chickpeas. Nuts. Tofu. Beverages Tomato juice. Fresh orange juice. Prune juice. Hibiscus tea. Fortified instant breakfast shakes. Condiments Tahini. Fermented soy sauce. Sweets and Desserts Black-strap molasses.  Other Wheat germ. The items listed above may not be a complete list of recommended foods or beverages. Contact your dietitian for more options. WHAT FOODS ARE NOT RECOMMENDED? Grains Whole grains. Bran cereal. Bran flour.  Oats. Vegetables Artichokes. Brussels sprouts. Kale. Fruits Blueberries. Raspberries. Strawberries. Figs. Meats and Other Protein Sources Soybeans. Products made from soy protein. Dairy Milk. Cream. Cheese. Yogurt. Cottage cheese. Beverages Coffee. Black tea. Red wine. Sweets and Desserts Cocoa. Chocolate. Ice cream. Other Basil. Oregano. Parsley. The items listed above may not be a complete list of foods and beverages to avoid. Contact your dietitian for more information.   This information is not intended to replace advice given to you by your health care provider. Make sure you discuss any questions you have with your health care provider.   Document Released: 02/08/2005 Document Revised: 07/18/2014 Document Reviewed: 01/22/2014 Elsevier Interactive Patient Education 2016 [employer]. Anemia, Nonspecific Anemia is a condition in which the concentration of red blood cells or hemoglobin in the blood is below normal. Hemoglobin is a substance in red blood cells that carries oxygen to the tissues of the body. Anemia results in not enough oxygen reaching these tissues.  CAUSES  Common causes of anemia include:   Excessive bleeding. Bleeding may be internal or external. This includes excessive bleeding from periods (in women) or from the intestine.   Poor nutrition.   Chronic kidney, thyroid, and liver disease.  Bone marrow disorders that decrease red blood cell production.  Cancer and treatments for cancer.  HIV, AIDS, and their treatments.  Spleen problems that increase red blood cell destruction.  Blood disorders.  Excess destruction of red blood cells due to infection, medicines, and autoimmune disorders. SIGNS AND SYMPTOMS   Minor weakness.   Dizziness.   Headache.  Palpitations.   Shortness of breath, especially with exercise.   Paleness.  Cold sensitivity.  Indigestion.  Nausea.  Difficulty sleeping.  Difficulty  concentrating. Symptoms may occur suddenly or they may develop slowly.  DIAGNOSIS  Additional blood tests are often needed. These help your health care provider determine the best treatment. Your health care provider will check your stool for blood and look for other causes of blood loss.  TREATMENT  Treatment varies depending on the cause of the anemia. Treatment can include:   Supplements of iron, vitamin E26, or folic acid.   Hormone medicines.   A blood transfusion. This may be needed if blood loss is severe.   Hospitalization. This may be needed if there is significant continual blood loss.   Dietary changes.  Spleen removal. HOME CARE INSTRUCTIONS Keep all follow-up appointments. It often takes many weeks to correct anemia, and having your health care provider check on your condition and your response to treatment is very important. [place]  IF:   You develop extreme weakness, shortness of breath, or chest pain.   You become dizzy or have trouble concentrating.  You develop heavy vaginal bleeding.   You develop a rash.   You have bloody or black, tarry stools.   You faint.   You vomit up blood.   You vomit repeatedly.   You have abdominal pain.  You have a fever or persistent symptoms for more than 2-3 days.   You have a fever and your symptoms suddenly get worse.   You are dehydrated.  MAKE SURE YOU:  Understand these instructions.  Will watch your condition.  Will get help right away if you are not doing well or get worse.   This information is not intended to replace advice given to you by your health care provider. Make sure you discuss any questions you have with your health care provider.   Document Released: 08/04/2004 Document Revised: 02/27/2013 Document Reviewed: 12/21/2012 Elsevier Interactive Patient Education 2016 [employer]. Postpartum Depression and Baby Blues The postpartum period begins right after  the birth of a baby. During this time, there is often a great amount of joy and excitement. It is also a time of many changes in the life of the parents. Regardless of how many times a mother gives birth, each child brings new challenges and dynamics to the family. It is not unusual to have feelings of excitement along with confusing shifts in moods, emotions, and thoughts. All mothers are at risk of developing postpartum depression or the "baby blues." These mood changes can occur right after giving birth, or they may occur many months after giving birth. The baby blues or postpartum depression can be mild or severe. Additionally, postpartum depression can go away rather quickly, or it can be a long-term condition.  CAUSES Raised hormone levels and the rapid drop in those levels are thought to be a main cause of postpartum depression and the baby blues. A number of hormones change during and after pregnancy. Estrogen and progesterone usually decrease right after the delivery of your baby. The levels of thyroid hormone and various cortisol steroids also rapidly drop. Other factors that play a role in these mood changes include major life events and genetics.  RISK FACTORS If you have any of the following risks for the baby blues or postpartum depression, know what symptoms to watch out for during the postpartum period. Risk factors that may increase the likelihood of getting the baby blues or postpartum depression include:  Having a personal or family history of depression.   Having depression while being pregnant.   Having premenstrual mood issues or mood issues related to oral contraceptives.  Having a lot of life stress.   Having marital conflict.   Lacking a social support network.   Having a baby with special needs.   Having health problems, such as diabetes.  SIGNS AND SYMPTOMS Symptoms of baby blues include:  Brief changes in mood, such as going from extreme happiness to  sadness.  Decreased concentration.   Difficulty sleeping.   Crying spells, tearfulness.   Irritability.   Anxiety.  Symptoms of postpartum depression typically begin within the first month after giving birth. These symptoms include:  Difficulty sleeping or excessive sleepiness.   Marked weight loss.   Agitation.   Feelings of worthlessness.   Lack of interest in activity or food.  Postpartum psychosis is a very serious condition and can be dangerous. Fortunately, it is rare. Displaying any of the following  symptoms is cause for immediate medical attention. Symptoms of postpartum psychosis include:   Hallucinations and delusions.   Bizarre or disorganized behavior.   Confusion or disorientation.  DIAGNOSIS  A diagnosis is made by an evaluation of your symptoms. There are no medical or lab tests that lead to a diagnosis, but there are various questionnaires that a health care provider may use to identify those with the baby blues, postpartum depression, or psychosis. Often, a screening tool called the [place] Postnatal Depression Scale is used to diagnose depression in the postpartum period.  TREATMENT The baby blues usually goes away on its own in 1-2 weeks. Social support is often all that is needed. You will be encouraged to get adequate sleep and rest. Occasionally, you may be given medicines to help you sleep.  Postpartum depression requires treatment because it can last several months or longer if it is not treated. Treatment may include individual or group therapy, medicine, or both to address any social, physiological, and psychological factors that may play a role in the depression. Regular exercise, a healthy diet, rest, and social support may also be strongly recommended.  Postpartum psychosis is more serious and needs treatment right away. Hospitalization is often needed. HOME CARE INSTRUCTIONS  Get as much rest as you can. Nap when the baby sleeps.    Exercise regularly. Some women find yoga and walking to be beneficial.   Eat a balanced and nourishing diet.   Do little things that you enjoy. Have a cup of tea, take a bubble bath, read your favorite magazine, or listen to your favorite music.  Avoid alcohol.   Ask for help with household chores, cooking, grocery shopping, or running errands as needed. Do not try to do everything.   Talk to people close to you about how you are feeling. Get support from your partner, family members, friends, or other new moms.  Try to stay positive in how you think. Think about the things you are grateful for.   Do not spend a lot of time alone.   Only take over-the-counter or prescription medicine as directed by your health care provider.  Keep all your postpartum appointments.   Let your health care provider know if you have any concerns.  SEEK MEDICAL CARE IF: You are having a reaction to or problems with your medicine. SEEK IMMEDIATE MEDICAL CARE IF:  You have suicidal feelings.   You think you may harm the baby or someone else. MAKE SURE YOU:  Understand these instructions.  Will watch your condition.  Will get help right away if you are not doing well or get worse.   This information is not intended to replace advice given to you by your health care provider. Make sure you discuss any questions you have with your health care provider.   Document Released: 03/31/2004 Document Revised: 07/02/2013 Document Reviewed: 04/08/2013 Elsevier Interactive Patient Education 2016 [place]. Postpartum Care After Vaginal Delivery After you deliver your newborn (postpartum period), the usual stay in the hospital is 24-72 hours. If there were problems with your labor or delivery, or if you have other medical problems, you might be in the hospital longer.  While you are in the hospital, you will receive help and instructions on how to care for yourself and your newborn during the  postpartum period.  While you are in the hospital:  Be sure to tell your nurses if you have pain or discomfort, as well as where you feel the pain and  what makes the pain worse.  If you had an incision made near your vagina (episiotomy) or if you had some tearing during delivery, the nurses may put ice packs on your episiotomy or tear. The ice packs may help to reduce the pain and swelling.  If you are breastfeeding, you may feel uncomfortable contractions of your uterus for a couple of weeks. This is normal. The contractions help your uterus get back to normal size.  It is normal to have some bleeding after delivery.  For the first 1-3 days after delivery, the flow is red and the amount may be similar to a period.  It is common for the flow to start and stop.  In the first few days, you may pass some small clots. Let your nurses know if you begin to pass large clots or your flow increases.  Do not  flush blood clots down the toilet before having the nurse look at them.  During the next 3-10 days after delivery, your flow should become more watery and pink or brown-tinged in color.  Ten to fourteen days after delivery, your flow should be a small amount of yellowish-white discharge.  The amount of your flow will decrease over the first few weeks after delivery. Your flow may stop in 6-8 weeks. Most women have had their flow stop by 12 weeks after delivery.  You should change your sanitary pads frequently.  Wash your hands thoroughly with soap and water for at least 20 seconds after changing pads, using the toilet, or before holding or feeding your newborn.  You should feel like you need to empty your bladder within the first 6-8 hours after delivery.  In case you become weak, lightheaded, or faint, call your nurse before you get out of bed for the first time and before you take a shower for the first time.  Within the first few days after delivery, your breasts may begin to feel  tender and full. This is called engorgement. Breast tenderness usually goes away within 48-72 hours after engorgement occurs. You may also notice milk leaking from your breasts. If you are not breastfeeding, do not stimulate your breasts. Breast stimulation can make your breasts produce more milk.  Spending as much time as possible with your newborn is very important. During this time, you and your newborn can feel close and get to know each other. Having your newborn stay in your room (rooming in) will help to strengthen the bond with your newborn. It will give you time to get to know your newborn and become comfortable caring for your newborn.  Your hormones change after delivery. Sometimes the hormone changes can temporarily cause you to feel sad or tearful. These feelings should not last more than a few days. If these feelings last longer than that, you should talk to your caregiver.  If desired, talk to your caregiver about methods of family planning or contraception.  Talk to your caregiver about immunizations. Your caregiver may want you to have the following immunizations before leaving the hospital:  Tetanus, diphtheria, and pertussis (Tdap) or tetanus and diphtheria (Td) immunization. It is very important that you and your family (including grandparents) or others caring for your newborn are up-to-date with the Tdap or Td immunizations. The Tdap or Td immunization can help protect your newborn from getting ill.  Rubella immunization.  Varicella (chickenpox) immunization.  Influenza immunization. You should receive this annual immunization if you did not receive the immunization during your pregnancy.  This information is not intended to replace advice given to you by your health care provider. Make sure you discuss any questions you have with your health care provider.   Document Released: 04/24/2007 Document Revised: 03/21/2012 Document Reviewed: 02/22/2012 Elsevier Interactive  Patient Education [employer].

## 2016-04-13 NOTE — Discharge Summary (Signed)
[facility] Discharge Summary   Patient Name:   Natasha Smith DOB:     [birth year] MRN:     [id number]  Date of Admission:   04/11/2016 Date of Discharge:  04/14/2016  Admitting diagnosis:    LABOR Principal Problem:   Vaginal delivery  Term Pregnancy Delivered and Anemia    Discharge diagnosis:    LABOR Principal Problem:   Vaginal delivery  Term Pregnancy Delivered and Anemia                                                                     Post partum procedures: None  Type of Delivery:  SVB  Delivering Provider: Osborn Coho   Date of Delivery:  04/11/16  Newborn Data:    Live born female  Birth Weight: 8 lb 0.6 oz (3645 g) APGARS: 8, 9  Baby's Name:              Sincere Baby Feeding:   Bottle Disposition:   home with mother  Complications:   Hemorrhage>1028mL - given Methergine IV Pitocin and Cytotec rectally  Hospital course:      Onset of Labor With Vaginal Delivery     25 y.o. yo [id number] at [redacted]w[redacted]d was admitted in Active Labor on 04/11/2016. Patient had an uncomplicated labor course as follows:  Membrane Rupture Time/Date: 2:23 PM ,04/11/2016   Intrapartum Procedures: Episiotomy: None [1]                                         Lacerations:  None [1]  Patient had a delivery of a Viable infant. 04/11/2016  Information for the patient's newborn:  [place], [place] [[id number]]  Delivery Method: Vaginal, Spontaneous Delivery (Filed from Delivery Summary)    Patient had an uncomplicated postpartum course.  She is ambulating, tolerating a regular diet, passing flatus, and urinating well. Patient is discharged home in stable condition on 04/14/16.    Physical Exam:   Vitals:   04/12/16 0400 04/12/16 0547 04/12/16 1857 04/13/16 0530  BP: 103/69 113/67 110/61 (!) 96/56  Pulse: 93 81 75 75  Resp: 20 18 18 18   Temp:  98.1 F (36.7 C) 98.3 F (36.8 C)   TempSrc:   Oral   SpO2: 100%      General: alert, cooperative and no distress Lochia:  appropriate Uterine Fundus: firm Incision: N/A DVT Evaluation: No evidence of DVT seen on physical exam. Negative Homan's sign. No cords or calf tenderness.  Labs: Lab Results  Component Value Date   WBC 20.9 (H) 04/12/2016   HGB 8.3 (L) 04/12/2016   HCT 25.0 (L) 04/12/2016   MCV 67.8 (L) 04/12/2016   PLT 156 04/12/2016   CMP Latest Ref Rng & Units 03/27/2016  Glucose 65 - 99 mg/dL 78  BUN 6 - 20 mg/dL 6  Creatinine 1.30 - 8.65 mg/dL 7.84  Sodium 696 - 295 mmol/L 133(L)  Potassium 3.5 - 5.1 mmol/L 3.3(L)  Chloride 101 - 111 mmol/L 106  CO2 22 - 32 mmol/L 21(L)  Calcium 8.9 - 10.3 mg/dL 8.9  Total Protein 6.5 - 8.1 g/dL 6.4(L)  Total Bilirubin 0.3 - 1.2  mg/dL 0.8  Alkaline Phos 38 - 126 U/L 150(H)  AST 15 - 41 U/L 13(L)  ALT 14 - 54 U/L 10(L)   Prenatal labs: ABO, Rh:  A + Antibody:   Neg Rubella:  Immune RPR:   NR HBsAg:   Neg HIV: Non Reactive (03/24 1916)  GBS: Neg  Discharge instruction: per After Visit Summary and "Baby and Me Booklet".  After Visit Meds:    Medication List    STOP taking these medications   metroNIDAZOLE 500 MG tablet Commonly known as:  FLAGYL     TAKE these medications   ferrous sulfate 325 (65 FE) MG tablet Take 1 tablet (325 mg total) by mouth 2 (two) times daily with a meal.   ibuprofen 600 MG tablet Commonly known as:  ADVIL,MOTRIN Take 1 tablet (600 mg total) by mouth every 6 (six) hours as needed for fever, headache, mild pain, moderate pain or cramping.   oxyCODONE-acetaminophen 5-325 MG tablet Commonly known as:  PERCOCET/ROXICET Take 1 tablet by mouth every 6 (six) hours as needed for moderate pain or severe pain (MAY TAKE 1 TABLET EVERY 4-6 HRS PRN).   PRENATAL ADULT GUMMY/DHA/FA PO Take 3 each by mouth daily.       Diet: routine diet  Activity: Advance as tolerated. Pelvic rest for 6 weeks.   Outpatient follow up:6 weeks  Postpartum contraception: Depo Provera. Discussed risks, benefits and alternatives of  Depo-Provera including but not limited to risks of irregular bleeding and weight gain.  Discussed maximum Depo-Provera use of two years.     04/14/2016 [name], Rj Pedrosa, CNM

## 2016-04-15 LAB — TYPE AND SCREEN
ABO/RH(D): A POS
Antibody Screen: NEGATIVE
UNIT DIVISION: 0
Unit division: 0

## 2016-08-04 ENCOUNTER — Ambulatory Visit (INDEPENDENT_AMBULATORY_CARE_PROVIDER_SITE_OTHER): Payer: Medicaid Other | Admitting: Internal Medicine

## 2016-08-04 ENCOUNTER — Other Ambulatory Visit (HOSPITAL_COMMUNITY)
Admission: RE | Admit: 2016-08-04 | Discharge: 2016-08-04 | Disposition: A | Discharge status: Home / Self Care | Payer: Medicaid Other | Source: Ambulatory Visit | Attending: Family Medicine | Admitting: Family Medicine

## 2016-08-04 ENCOUNTER — Encounter: Payer: Self-pay | Admitting: Internal Medicine

## 2016-08-04 VITALS — BP 130/88 | HR 76 | Temp 98.0°F | Wt 191.0 lb

## 2016-08-04 DIAGNOSIS — N898 Other specified noninflammatory disorders of vagina: Secondary | ICD-10-CM

## 2016-08-04 DIAGNOSIS — Z113 Encounter for screening for infections with a predominantly sexual mode of transmission: Secondary | ICD-10-CM | POA: Insufficient documentation

## 2016-08-04 LAB — POCT WET PREP (WET MOUNT)
Clue Cells Wet Prep Whiff POC: POSITIVE
TRICHOMONAS WET PREP HPF POC: ABSENT

## 2016-08-04 MED ORDER — METRONIDAZOLE 500 MG PO TABS: 500.0000 mg | ORAL_TABLET | Freq: Two times a day (BID) | ORAL | 0 refills | Status: DC

## 2016-08-04 NOTE — Assessment & Plan Note (Signed)
Likely due to BV as wet prep with clue cells and positive whiff. Also obtained GC/chlamydia as patient with concerns for STDs after learning of boyfriend's infidelity.  - Metronidazole 500mg  BID x7d for BV - Will f/u on GC/chlamydia results and alert patient accordingly

## 2016-08-04 NOTE — Progress Notes (Signed)
   Subjective:   Patient: Natasha Smith       Birthdate: May 02, 1991       MRN: [id number]      HPI  Natasha Illshley Alf is a 26 y.o. female presenting for same day appointment for vaginal discharge.   Vaginal discharge Began one week ago. Describes discharge as white and thick with an odor. Denies vaginal irritation or burning. Denies dysuria, hematuria. Denies abdominal pain. Denies fevers, chills. Has not tried anything at home. Patient is especially concerned because she recently learned that her boyfriend cheated on her.   Smoking status reviewed. Patient is a former smoker.   Review of Systems See HPI.     Objective:  Physical Exam  Constitutional: She is oriented to person, place, and time and well-developed, well-nourished, and in no distress.  HENT:  Head: Normocephalic and atraumatic.  Eyes: Conjunctivae and EOM are normal. Right eye exhibits no discharge. Left eye exhibits no discharge.  Pulmonary/Chest: Effort normal. No respiratory distress.  Abdominal: Soft. She exhibits no distension and no mass. There is no tenderness.  Genitourinary: Vagina normal and cervix normal.  Genitourinary Comments: Large amount of thick white discharge present. No odor detected. No cervical motion tenderness.   Neurological: She is alert and oriented to person, place, and time.  Skin: Skin is warm and dry.  Psychiatric: Affect and judgment normal.      Assessment & Plan:  Vaginal discharge Likely due to BV as wet prep with clue cells and positive whiff. Also obtained GC/chlamydia as patient with concerns for STDs after learning of boyfriend's infidelity.  - Metronidazole 500mg  BID x7d for BV - Will f/u on GC/chlamydia results and alert patient accordingly   Tarri AbernethyAbigail J Barb Shear, MD, MPH PGY-2 [facility] Pager [phone]

## 2016-08-04 NOTE — Patient Instructions (Addendum)
It was nice meeting you today Ms. Lewellyn!  I will call you if there are any abnormalities or if you need to start any medications.   If you have any questions or concerns, please feel free to call the clinic.   Be well,  Dr. Natale MilchLancaster

## 2016-08-05 LAB — CERVICOVAGINAL ANCILLARY ONLY
CHLAMYDIA, DNA PROBE: NEGATIVE
Neisseria Gonorrhea: NEGATIVE

## 2016-10-06 ENCOUNTER — Ambulatory Visit: Payer: Medicaid Other

## 2016-10-13 ENCOUNTER — Ambulatory Visit: Payer: Medicaid Other | Admitting: Internal Medicine

## 2016-12-07 ENCOUNTER — Inpatient Hospital Stay (HOSPITAL_COMMUNITY)
Admission: AD | Admit: 2016-12-07 | Discharge: 2016-12-07 | Disposition: A | Discharge status: Home / Self Care | Payer: Medicaid Other | Source: Ambulatory Visit | Attending: Obstetrics and Gynecology | Admitting: Obstetrics and Gynecology

## 2016-12-07 ENCOUNTER — Encounter (HOSPITAL_COMMUNITY): Payer: Self-pay | Admitting: *Deleted

## 2016-12-07 DIAGNOSIS — R42 Dizziness and giddiness: Secondary | ICD-10-CM

## 2016-12-07 DIAGNOSIS — O26891 Other specified pregnancy related conditions, first trimester: Secondary | ICD-10-CM | POA: Diagnosis present

## 2016-12-07 DIAGNOSIS — D573 Sickle-cell trait: Secondary | ICD-10-CM | POA: Insufficient documentation

## 2016-12-07 DIAGNOSIS — R001 Bradycardia, unspecified: Secondary | ICD-10-CM | POA: Insufficient documentation

## 2016-12-07 DIAGNOSIS — O99011 Anemia complicating pregnancy, first trimester: Secondary | ICD-10-CM | POA: Insufficient documentation

## 2016-12-07 DIAGNOSIS — Z87891 Personal history of nicotine dependence: Secondary | ICD-10-CM | POA: Diagnosis not present

## 2016-12-07 DIAGNOSIS — Z3A01 Less than 8 weeks gestation of pregnancy: Secondary | ICD-10-CM | POA: Insufficient documentation

## 2016-12-07 DIAGNOSIS — O9989 Other specified diseases and conditions complicating pregnancy, childbirth and the puerperium: Secondary | ICD-10-CM

## 2016-12-07 DIAGNOSIS — B009 Herpesviral infection, unspecified: Secondary | ICD-10-CM | POA: Insufficient documentation

## 2016-12-07 DIAGNOSIS — Z7982 Long term (current) use of aspirin: Secondary | ICD-10-CM | POA: Diagnosis not present

## 2016-12-07 DIAGNOSIS — O98511 Other viral diseases complicating pregnancy, first trimester: Secondary | ICD-10-CM | POA: Diagnosis not present

## 2016-12-07 LAB — URINALYSIS, ROUTINE W REFLEX MICROSCOPIC
BILIRUBIN URINE: NEGATIVE
GLUCOSE, UA: NEGATIVE mg/dL
Hgb urine dipstick: NEGATIVE
KETONES UR: NEGATIVE mg/dL
Leukocytes, UA: NEGATIVE
Nitrite: NEGATIVE
PROTEIN: NEGATIVE mg/dL
Specific Gravity, Urine: 1.016 (ref 1.005–1.030)
pH: 5 (ref 5.0–8.0)

## 2016-12-07 LAB — CBC
HCT: 31.5 % — ABNORMAL LOW (ref 36.0–46.0)
Hemoglobin: 10 g/dL — ABNORMAL LOW (ref 12.0–15.0)
MCH: 19.8 pg — AB (ref 26.0–34.0)
MCHC: 31.7 g/dL (ref 30.0–36.0)
MCV: 62.5 fL — AB (ref 78.0–100.0)
PLATELETS: 273 10*3/uL (ref 150–400)
RBC: 5.04 MIL/uL (ref 3.87–5.11)
RDW: 18.3 % — AB (ref 11.5–15.5)
WBC: 10.8 10*3/uL — ABNORMAL HIGH (ref 4.0–10.5)

## 2016-12-07 LAB — POCT PREGNANCY, URINE: Preg Test, Ur: POSITIVE — AB

## 2016-12-07 MED ORDER — FERROUS SULFATE 325 (65 FE) MG PO TABS: 325.0000 mg | ORAL_TABLET | Freq: Two times a day (BID) | ORAL | 11 refills | Status: DC

## 2016-12-07 MED ORDER — ASPIRIN 81 MG PO CHEW: 81.0000 mg | CHEWABLE_TABLET | Freq: Every day | ORAL | 11 refills | Status: DC

## 2016-12-07 NOTE — MAU Note (Signed)
Pt C/O dizziness, seeing black spots, vomiting.  Pos HPT today.  Has hx of preeclampsia in the past so she was worried.  Denies abd pain or bleeding.

## 2016-12-07 NOTE — Discharge Instructions (Signed)
First Trimester of Pregnancy The first trimester of pregnancy is from week 1 until the end of week 13 (months 1 through 3). A week after a sperm fertilizes an egg, the egg will implant on the wall of the uterus. This embryo will begin to develop into a baby. Genes from you and your partner will form the baby. The female genes will determine whether the baby will be a boy or a girl. At 6-8 weeks, the eyes and face will be formed, and the heartbeat can be seen on ultrasound. At the end of 12 weeks, all the baby's organs will be formed. Now that you are pregnant, you will want to do everything you can to have a healthy baby. Two of the most important things are to get good prenatal care and to follow your health care provider's instructions. Prenatal care is all the medical care you receive before the baby's birth. This care will help prevent, find, and treat any problems during the pregnancy and childbirth. Body changes during your first trimester Your body goes through many changes during pregnancy. The changes vary from woman to woman.  You may gain or lose a couple of pounds at first.  You may feel sick to your stomach (nauseous) and you may throw up (vomit). If the vomiting is uncontrollable, call your health care provider.  You may tire easily.  You may develop headaches that can be relieved by medicines. All medicines should be approved by your health care provider.  You may urinate more often. Painful urination may mean you have a bladder infection.  You may develop heartburn as a result of your pregnancy.  You may develop constipation because certain hormones are causing the muscles that push stool through your intestines to slow down.  You may develop hemorrhoids or swollen veins (varicose veins).  Your breasts may begin to grow larger and become tender. Your nipples may stick out more, and the tissue that surrounds them (areola) may become darker.  Your gums may bleed and may be  sensitive to brushing and flossing.  Dark spots or blotches (chloasma, mask of pregnancy) may develop on your face. This will likely fade after the baby is born.  Your menstrual periods will stop.  You may have a loss of appetite.  You may develop cravings for certain kinds of food.  You may have changes in your emotions from day to day, such as being excited to be pregnant or being concerned that something may go wrong with the pregnancy and baby.  You may have more vivid and strange dreams.  You may have changes in your hair. These can include thickening of your hair, rapid growth, and changes in texture. Some women also have hair loss during or after pregnancy, or hair that feels dry or thin. Your hair will most likely return to normal after your baby is born.  What to expect at prenatal visits During a routine prenatal visit:  You will be weighed to make sure you and the baby are growing normally.  Your blood pressure will be taken.  Your abdomen will be measured to track your baby's growth.  The fetal heartbeat will be listened to between weeks 10 and 14 of your pregnancy.  Test results from any previous visits will be discussed.  Your health care provider may ask you:  How you are feeling.  If you are feeling the baby move.  If you have had any abnormal symptoms, such as leaking fluid, bleeding, severe headaches,   or abdominal cramping.  If you are using any tobacco products, including cigarettes, chewing tobacco, and electronic cigarettes.  If you have any questions.  Other tests that may be performed during your first trimester include:  Blood tests to find your blood type and to check for the presence of any previous infections. The tests will also be used to check for low iron levels (anemia) and protein on red blood cells (Rh antibodies). Depending on your risk factors, or if you previously had diabetes during pregnancy, you may have tests to check for high blood  sugar that affects pregnant women (gestational diabetes).  Urine tests to check for infections, diabetes, or protein in the urine.  An ultrasound to confirm the proper growth and development of the baby.  Fetal screens for spinal cord problems (spina bifida) and Down syndrome.  HIV (human immunodeficiency virus) testing. Routine prenatal testing includes screening for HIV, unless you choose not to have this test.  You may need other tests to make sure you and the baby are doing well.  Follow these instructions at home: Medicines  Follow your health care provider's instructions regarding medicine use. Specific medicines may be either safe or unsafe to take during pregnancy.  Take a prenatal vitamin that contains at least 600 micrograms (mcg) of folic acid.  If you develop constipation, try taking a stool softener if your health care provider approves. Eating and drinking  Eat a balanced diet that includes fresh fruits and vegetables, whole grains, good sources of protein such as meat, eggs, or tofu, and low-fat dairy. Your health care provider will help you determine the amount of weight gain that is right for you.  Avoid raw meat and uncooked cheese. These carry germs that can cause birth defects in the baby.  Eating four or five small meals rather than three large meals a day may help relieve nausea and vomiting. If you start to feel nauseous, eating a few soda crackers can be helpful. Drinking liquids between meals, instead of during meals, also seems to help ease nausea and vomiting.  Limit foods that are high in fat and processed sugars, such as fried and sweet foods.  To prevent constipation: ? Eat foods that are high in fiber, such as fresh fruits and vegetables, whole grains, and beans. ? Drink enough fluid to keep your urine clear or pale yellow. Activity  Exercise only as directed by your health care provider. Most women can continue their usual exercise routine during  pregnancy. Try to exercise for 30 minutes at least 5 days a week. Exercising will help you: ? Control your weight. ? Stay in shape. ? Be prepared for labor and delivery.  Experiencing pain or cramping in the lower abdomen or lower back is a good sign that you should stop exercising. Check with your health care provider before continuing with normal exercises.  Try to avoid standing for long periods of time. Move your legs often if you must stand in one place for a long time.  Avoid heavy lifting.  Wear low-heeled shoes and practice good posture.  You may continue to have sex unless your health care provider tells you not to. Relieving pain and discomfort  Wear a good support bra to relieve breast tenderness.  Take warm sitz baths to soothe any pain or discomfort caused by hemorrhoids. Use hemorrhoid cream if your health care provider approves.  Rest with your legs elevated if you have leg cramps or low back pain.  If you develop   varicose veins in your legs, wear support hose. Elevate your feet for 15 minutes, 3-4 times a day. Limit salt in your diet. Prenatal care  Schedule your prenatal visits by the twelfth week of pregnancy. They are usually scheduled monthly at first, then more often in the last 2 months before delivery.  Write down your questions. Take them to your prenatal visits.  Keep all your prenatal visits as told by your health care provider. This is important. Safety  Wear your seat belt at all times when driving.  Make a list of emergency phone numbers, including numbers for family, friends, the hospital, and police and fire departments. General instructions  Ask your health care provider for a referral to a local prenatal education class. Begin classes no later than the beginning of month 6 of your pregnancy.  Ask for help if you have counseling or nutritional needs during pregnancy. Your health care provider can offer advice or refer you to specialists for help  with various needs.  Do not use hot tubs, steam rooms, or saunas.  Do not douche or use tampons or scented sanitary pads.  Do not cross your legs for long periods of time.  Avoid cat litter boxes and soil used by cats. These carry germs that can cause birth defects in the baby and possibly loss of the fetus by miscarriage or stillbirth.  Avoid all smoking, herbs, alcohol, and medicines not prescribed by your health care provider. Chemicals in these products affect the formation and growth of the baby.  Do not use any products that contain nicotine or tobacco, such as cigarettes and e-cigarettes. If you need help quitting, ask your health care provider. You may receive counseling support and other resources to help you quit.  Schedule a dentist appointment. At home, brush your teeth with a soft toothbrush and be gentle when you floss. Contact a health care provider if:  You have dizziness.  You have mild pelvic cramps, pelvic pressure, or nagging pain in the abdominal area.  You have persistent nausea, vomiting, or diarrhea.  You have a bad smelling vaginal discharge.  You have pain when you urinate.  You notice increased swelling in your face, hands, legs, or ankles.  You are exposed to fifth disease or chickenpox.  You are exposed to German measles (rubella) and have never had it. Get help right away if:  You have a fever.  You are leaking fluid from your vagina.  You have spotting or bleeding from your vagina.  You have severe abdominal cramping or pain.  You have rapid weight gain or loss.  You vomit blood or material that looks like coffee grounds.  You develop a severe headache.  You have shortness of breath.  You have any kind of trauma, such as from a fall or a car accident. Summary  The first trimester of pregnancy is from week 1 until the end of week 13 (months 1 through 3).  Your body goes through many changes during pregnancy. The changes vary from  woman to woman.  You will have routine prenatal visits. During those visits, your health care provider will examine you, discuss any test results you may have, and talk with you about how you are feeling. This information is not intended to replace advice given to you by your health care provider. Make sure you discuss any questions you have with your health care provider. Document Released: 06/21/2001 Document Revised: 06/08/2016 Document Reviewed: 06/08/2016 Elsevier Interactive Patient Education  2017 Elsevier   Inc.  

## 2016-12-07 NOTE — MAU Provider Note (Signed)
History     CSN: [id number]  Arrival date and time: 12/07/16 [id number]   First Provider Initiated Contact with Patient 12/07/16 1856      Chief Complaint  Patient presents with  . Dizziness   HPI   Ms.Natasha Smith is a 26 y.o. female [phone] [id number] @ 24102w1d here in MAU with complaints of dizziness. The symptoms have been going on for 2-3 days. She had this with her last pregnancy. The symptoms worsen when she is up walking around of standing up. She is vomiting 2 X every day. She is not taking anything for the nausea. She is keeping down fluid.   OB History    Gravida Para Term Preterm AB Living   8 4 4  0 3 4   SAB TAB Ectopic Multiple Live Births   1 1 0 0 4      Past Medical History:  Diagnosis Date  . Abnormal Pap smear   . Headache   . Herpes simplex type II infection   . History of chlamydia infection   . Infection    UTI  . Pregnancy induced hypertension    hx  . Sickle cell trait [facility](HCC)     Past Surgical History:  Procedure Laterality Date  . KNEE SURGERY      Family History  Problem Relation Age of Onset  . Diabetes Maternal Grandmother   . Hypertension Mother   . Asthma Mother   . Heart disease Maternal Grandfather   . Asthma Sister   . Asthma Brother   . Cancer Maternal Aunt     Social History  Substance Use Topics  . Smoking status: Former Games developermoker  . Smokeless tobacco: Never Used     Comment: March 2015  . Alcohol use No    Allergies: No Known Allergies  Prescriptions Prior to Admission  Medication Sig Dispense Refill Last Dose  . aspirin-acetaminophen-caffeine (EXCEDRIN MIGRAINE) 250-250-65 MG tablet Take 1 tablet by mouth every 6 (six) hours as needed for headache.   12/07/2016 at Unknown time  . metroNIDAZOLE (FLAGYL) 500 MG tablet Take 1 tablet (500 mg total) by mouth 2 (two) times daily. (Patient not taking: Reported on 12/07/2016) 14 tablet 0 Not Taking at Unknown time   Results for orders placed or performed during the hospital encounter of  12/07/16 (from the past 48 hour(s))  Urinalysis, Routine w reflex microscopic     Status: None   Collection Time: 12/07/16  5:50 PM  Result Value Ref Range   Color, Urine YELLOW YELLOW   APPearance CLEAR CLEAR   Specific Gravity, Urine 1.016 1.005 - 1.030   pH 5.0 5.0 - 8.0   Glucose, UA NEGATIVE NEGATIVE mg/dL   Hgb urine dipstick NEGATIVE NEGATIVE   Bilirubin Urine NEGATIVE NEGATIVE   Ketones, ur NEGATIVE NEGATIVE mg/dL   Protein, ur NEGATIVE NEGATIVE mg/dL   Nitrite NEGATIVE NEGATIVE   Leukocytes, UA NEGATIVE NEGATIVE  Pregnancy, urine POC     Status: Abnormal   Collection Time: 12/07/16  5:57 PM  Result Value Ref Range   Preg Test, Ur POSITIVE (A) NEGATIVE    Comment:        THE SENSITIVITY OF THIS METHODOLOGY IS >24 mIU/mL   CBC     Status: Abnormal   Collection Time: 12/07/16  6:16 PM  Result Value Ref Range   WBC 10.8 (H) 4.0 - 10.5 K/uL   RBC 5.04 3.87 - 5.11 MIL/uL   Hemoglobin 10.0 (L) 12.0 - 15.0 g/dL   HCT 51.831.5 (  L) 36.0 - 46.0 %   MCV 62.5 (L) 78.0 - 100.0 fL   MCH 19.8 (L) 26.0 - 34.0 pg   MCHC 31.7 30.0 - 36.0 g/dL   RDW 40.9 (H) 81.1 - 91.4 %   Platelets 273 150 - 400 K/uL    Review of Systems  Constitutional: Negative for fatigue.  Respiratory: Positive for shortness of breath. Negative for chest tightness.   Cardiovascular: Negative for chest pain.  Gastrointestinal: Negative for abdominal pain.  Genitourinary: Negative for vaginal bleeding.   Physical Exam   Blood pressure (!) 130/93, pulse 90, temperature 98.1 F (36.7 C), temperature source Oral, resp. rate 18, height 5\' 4"  (1.626 m), weight 194 lb (88 kg), last menstrual period 11/01/2016, SpO2 99 %, not currently breastfeeding.   Patient Vitals for the past 24 hrs:  BP Temp Temp src Pulse Resp SpO2 Height Weight  12/07/16 1837 (!) 130/93 - - 90 - 99 % - -  12/07/16 1833 122/83 - - 86 - 100 % - -  12/07/16 1831 137/77 - - 76 - - - -  12/07/16 1830 139/65 - - 66 - - - -  12/07/16 1828 - - - -  - 100 % - -  12/07/16 1746 (!) 141/74 98.1 F (36.7 C) Oral 84 18 - 5\' 4"  (1.626 m) 194 lb (88 kg)   Physical Exam  Nursing note and vitals reviewed. Constitutional: She is oriented to person, place, and time. She appears well-developed and well-nourished.  Non-toxic appearance. She does not have a sickly appearance. She does not appear ill. No distress.  HENT:  Head: Normocephalic.  Cardiovascular: Normal rate.   Respiratory: Effort normal and breath sounds normal. No respiratory distress.  Musculoskeletal: Normal range of motion.  Neurological: She is alert and oriented to person, place, and time.  Skin: Skin is warm. She is not diaphoretic.  Psychiatric: Her behavior is normal.   MAU Course  Procedures  None  MDM  Borderline orthostatic vitals CBC shows anemia EKG  UA without signs of dehydration.  Report given to Thressa Sheller CNM who resumes care of the patient.  2025: D/W Dr. Orson Aloe with cardiology. EKG normal, 2 beats below NSR.   Assessment and Plan   1. Dizziness   2. [redacted] weeks gestation of pregnancy    DC home Comfort measures reviewed  1st trimester precautions bleeding precautions RX: ASA 81mg  QD #30 with 11 RF, FESO4 BID #60 with 11 RF  Return to MAU as needed FU with OB as planned Will start baby ASA now consider anti-hypertensive when she starts [facility] (due to HX of pre-eclampsia, and mildly elevated blood pressure today).   Follow-up Information    [facility] Follow up.   Specialty:  Obstetrics and Gynecology Contact information: [address] [place] [address] [phone]

## 2017-01-16 ENCOUNTER — Ambulatory Visit: Payer: Medicaid Other | Admitting: Internal Medicine

## 2017-01-18 ENCOUNTER — Encounter: Payer: Self-pay | Admitting: Family Medicine

## 2017-01-18 ENCOUNTER — Ambulatory Visit (INDEPENDENT_AMBULATORY_CARE_PROVIDER_SITE_OTHER): Payer: Medicaid Other | Admitting: Family Medicine

## 2017-01-18 DIAGNOSIS — Z711 Person with feared health complaint in whom no diagnosis is made: Secondary | ICD-10-CM | POA: Diagnosis present

## 2017-01-18 NOTE — Patient Instructions (Addendum)
I think it would be best if you wait until you are off your period before we do the exam on you.  We will get better test results if you are not on your period.  From what you are telling me, you are at low risk for an STD At the same time we test you, you need a pap smear.

## 2017-01-19 DIAGNOSIS — Z711 Person with feared health complaint in whom no diagnosis is made: Secondary | ICD-10-CM | POA: Insufficient documentation

## 2017-01-19 NOTE — Progress Notes (Signed)
   Subjective:    Patient ID: Natasha Smith, female    DOB: Apr 29, 1991, 26 y.o.   MRN: [id number]  HPI Concern for STD.  Patient has been in a monogamous relationship for >5 years.  She made the appointment because her sig other complained of vague penile discomfort (no rash or ulcer) after sex.  She denies abd pain, fever, urgency, dysuria, frequency, vag discharge or pain on intercourse.  She is due for a pap.  Unfortunately, she has just begun her period - thus far a normal period for her.    Review of Systems     Objective:   Physical Exam Abd benign. Pelvic not done.        Assessment & Plan:

## 2017-01-24 ENCOUNTER — Other Ambulatory Visit (HOSPITAL_COMMUNITY)
Admission: RE | Admit: 2017-01-24 | Discharge: 2017-01-24 | Disposition: A | Discharge status: Home / Self Care | Payer: Medicaid Other | Source: Ambulatory Visit | Attending: Family Medicine | Admitting: Family Medicine

## 2017-01-24 ENCOUNTER — Encounter: Payer: Self-pay | Admitting: Student

## 2017-01-24 ENCOUNTER — Ambulatory Visit (INDEPENDENT_AMBULATORY_CARE_PROVIDER_SITE_OTHER): Payer: Medicaid Other | Admitting: Student

## 2017-01-24 VITALS — BP 126/82 | HR 78 | Temp 98.0°F | Ht 64.0 in | Wt 190.8 lb

## 2017-01-24 DIAGNOSIS — N898 Other specified noninflammatory disorders of vagina: Secondary | ICD-10-CM

## 2017-01-24 DIAGNOSIS — Z0000 Encounter for general adult medical examination without abnormal findings: Secondary | ICD-10-CM | POA: Insufficient documentation

## 2017-01-24 DIAGNOSIS — Z01419 Encounter for gynecological examination (general) (routine) without abnormal findings: Secondary | ICD-10-CM | POA: Insufficient documentation

## 2017-01-24 DIAGNOSIS — Z124 Encounter for screening for malignant neoplasm of cervix: Secondary | ICD-10-CM

## 2017-01-24 LAB — POCT WET PREP (WET MOUNT)
CLUE CELLS WET PREP WHIFF POC: NEGATIVE
TRICHOMONAS WET PREP HPF POC: ABSENT

## 2017-01-24 NOTE — Assessment & Plan Note (Signed)
Patient is here mainly because her boy friend complained of burning sensation during sexual intercourse. Pelvic exam within normal limit. Wet prep is negative. GC/CT is pending. I advised patient to encourage her partner to see his doctor.

## 2017-01-24 NOTE — Progress Notes (Signed)
  Subjective:    Natasha Smith is a 26 y.o. old female here for vaginal discharge  HPI Vaginal discharge: for two days. Whitish. No odor. No fever. One sexual partner in the last 12 months. LMP 01/16/2017. Not on BC. No lesion. No dysuria, urge or increased frequency of urination. She has a miscarriage and subsequent D&C recently. She is here because her boy friend complained about burning sensation when they had sexual intercourse. Patient was seen here about 5 days ago for similar issue. She did not have pelvic exam or testing done.  PMH/Problem List: has OBESITY; ECZEMA, ATOPIC DERMATITIS; Menorrhagia; HSV-2 (herpes simplex virus 2) infection; Sickle cell trait (HCC); Vaginal discharge; Concern about STD in female without diagnosis; and Routine adult health maintenance on her problem list.   has a past medical history of Abnormal Pap smear; Headache; Herpes simplex type II infection; History of chlamydia infection; Infection; Pregnancy induced hypertension; and Sickle cell trait (HCC).  FH:  Family History  Problem Relation Age of Onset  . Diabetes Maternal Grandmother   . Hypertension Mother   . Asthma Mother   . Heart disease Maternal Grandfather   . Asthma Sister   . Asthma Brother   . Cancer Maternal Aunt     SH Social History  Substance Use Topics  . Smoking status: Former Games developermoker  . Smokeless tobacco: Never Used     Comment: March 2015  . Alcohol use No    Review of Systems Review of systems negative except for pertinent positives and negatives in history of present illness above.     Objective:     Vitals:   01/24/17 1133  BP: 126/82  Pulse: 78  Temp: 98 F (36.7 C)  TempSrc: Oral  SpO2: 100%  Weight: 190 lb 12.8 oz (86.5 kg)  Height: 5\' 4"  (1.626 m)    Physical Exam GEN: appears well, no apparent distress. CVS: RRR, nl S1&S2, no murmurs, no edema RESP: speaks in full sentence, no IWOB GI: BS present & normal, soft, NTND GU:  No suprapubic or CVA  tenerness External genitalia: normal without surrounding skin lesion, obvious discharge or bleeeding.  Speculum: pink vaginal mucosa, ruggated, normal cervix & discharge. Multiparous cervix.  Bimanual: no cervical motion tenderness or adnexal mass. Uterus appears normal size  MSK: no focal tenderness or notable swelling SKIN: no apparent skin lesion NEURO: alert and oiented appropriately, no gross defecits  PSYCH: euthymic mood with congruent affect    Assessment and Plan:  Vaginal discharge Patient is here mainly because her boy friend complained of burning sensation during sexual intercourse. Pelvic exam within normal limit. Wet prep is negative. GC/CT is pending. I advised patient to encourage her partner to see his doctor.   Routine adult health maintenance Pap smear today.  Orders Placed This Encounter  Procedures  . GC/Chlamydia Probe Amp  . POCT Wet Prep [facility]([place])   Return if symptoms worsen or fail to improve.  Almon Herculesaye T Letasha Kershaw, MD 01/24/17 Pager: [phone]

## 2017-01-24 NOTE — Patient Instructions (Addendum)
It was great seeing you today! We have addressed the following issues today 1. Vaginal discharge: Your tests are negative so far. Your other tests might take 2-3 days. Someone will get in touch with you if the tests are abnormal. Otherwise, you'll get a the results in mail. Will recommend that your partner see his doctor for his symptoms.   If we did any lab work today, and the results require attention, either me or my nurse will get in touch with you. If everything is normal, you will get a letter in mail and a message via . If you don't hear from us in two weeks, please give us a call. Otherwise, we look forward to seeing you again at your next visit. If you have any questions or concerns before then, please call the clinic at [phone].  Please bring all your medications to every doctors visit  Sign up for My Chart to have easy access to your labs results, and communication with your Primary care physician.    Please check-out at the front desk before leaving the clinic.    Take Care,   Dr. Alanda SlimGonfa

## 2017-01-24 NOTE — Assessment & Plan Note (Signed)
Pap smear today. 

## 2017-01-25 LAB — CERVICOVAGINAL ANCILLARY ONLY
Chlamydia: NEGATIVE
Neisseria Gonorrhea: NEGATIVE

## 2017-01-25 LAB — CYTOLOGY - PAP: DIAGNOSIS: NEGATIVE

## 2017-01-26 ENCOUNTER — Other Ambulatory Visit: Payer: Self-pay | Admitting: Internal Medicine

## 2017-01-26 ENCOUNTER — Encounter: Payer: Self-pay | Admitting: Student

## 2017-01-26 MED ORDER — VALACYCLOVIR HCL 1 G PO TABS: 1000.0000 mg | ORAL_TABLET | Freq: Every day | ORAL | 3 refills | Status: DC

## 2017-01-26 NOTE — Telephone Encounter (Signed)
Pt  calling to request refill of:  Name of Medication(s):  Valtrex    Last date of OV:  01-24-17 Pharmacy:  [facility] [address]   Will route refill request to Clinic RN.  Discussed with patient policy to call pharmacy for future refills.  Also, discussed refills may take up to 48 hours to approve or deny.  Markus JarvisEmily C Pittman

## 2017-01-30 ENCOUNTER — Telehealth: Payer: Self-pay | Admitting: Internal Medicine

## 2017-01-30 NOTE — Telephone Encounter (Signed)
Pt would like someone to call with lab results. ep

## 2017-01-31 NOTE — Telephone Encounter (Signed)
Talked to her. I have already sent her result letters on 01/26/2017.  Thanks! Alwyn Renaye

## 2017-03-14 ENCOUNTER — Other Ambulatory Visit (HOSPITAL_COMMUNITY)
Admission: RE | Admit: 2017-03-14 | Discharge: 2017-03-14 | Disposition: A | Discharge status: Home / Self Care | Payer: Medicaid Other | Source: Ambulatory Visit | Attending: Family Medicine | Admitting: Family Medicine

## 2017-03-14 ENCOUNTER — Encounter: Payer: Self-pay | Admitting: Student

## 2017-03-14 ENCOUNTER — Ambulatory Visit (INDEPENDENT_AMBULATORY_CARE_PROVIDER_SITE_OTHER): Payer: Medicaid Other | Admitting: Student

## 2017-03-14 VITALS — BP 122/86 | HR 77 | Temp 97.6°F | Wt 194.8 lb

## 2017-03-14 DIAGNOSIS — N760 Acute vaginitis: Secondary | ICD-10-CM

## 2017-03-14 DIAGNOSIS — B9689 Other specified bacterial agents as the cause of diseases classified elsewhere: Secondary | ICD-10-CM

## 2017-03-14 DIAGNOSIS — N926 Irregular menstruation, unspecified: Secondary | ICD-10-CM | POA: Diagnosis not present

## 2017-03-14 DIAGNOSIS — Z3201 Encounter for pregnancy test, result positive: Secondary | ICD-10-CM | POA: Diagnosis present

## 2017-03-14 DIAGNOSIS — O210 Mild hyperemesis gravidarum: Secondary | ICD-10-CM | POA: Diagnosis not present

## 2017-03-14 LAB — POCT URINALYSIS DIP (MANUAL ENTRY)
BILIRUBIN UA: NEGATIVE mg/dL
Bilirubin, UA: NEGATIVE
Blood, UA: NEGATIVE
Glucose, UA: NEGATIVE mg/dL
LEUKOCYTES UA: NEGATIVE
NITRITE UA: NEGATIVE
Protein Ur, POC: NEGATIVE mg/dL
Spec Grav, UA: 1.015 (ref 1.010–1.025)
Urobilinogen, UA: 0.2 E.U./dL
pH, UA: 6 (ref 5.0–8.0)

## 2017-03-14 LAB — POCT WET PREP (WET MOUNT)
Clue Cells Wet Prep Whiff POC: NEGATIVE
TRICHOMONAS WET PREP HPF POC: ABSENT

## 2017-03-14 LAB — POCT URINE PREGNANCY: Preg Test, Ur: POSITIVE — AB

## 2017-03-14 MED ORDER — PRENATAL VITAMIN 27-0.8 MG PO TABS: 1.0000 | ORAL_TABLET | Freq: Every day | ORAL | 3 refills | Status: DC

## 2017-03-14 MED ORDER — VITAMIN B-6 250 MG PO TABS: 250.0000 mg | ORAL_TABLET | Freq: Every day | ORAL | 0 refills | Status: DC

## 2017-03-14 MED ORDER — PROMETHAZINE HCL 25 MG PO TABS: 25.0000 mg | ORAL_TABLET | Freq: Three times a day (TID) | ORAL | 0 refills | Status: DC | PRN

## 2017-03-14 MED ORDER — METRONIDAZOLE 0.75 % VA GEL: 1.0000 | Freq: Two times a day (BID) | VAGINAL | 0 refills | Status: DC

## 2017-03-14 NOTE — Progress Notes (Signed)
Subjective:    Natasha Smith is a 26 y.o. old female here SDA for vomiting  HPI Vomiting: this has been going on for the last two days. Emesis was food content. Usually in the mornings. Once this morning. Able to keep down some fluid. Reports low abdominal pain for the last two days as well. She describes the pain as cramping. Denies dysuria, increased frequency or urgency, fever or chills. Reports some vaginal discharge but denies vaginal bleeding or pruritus. Reports back pain which is chronic. She reports diarrhea which she describes as loose stool. Twice yesterday and once this morning. Denies blood in emesis or stool. Denies URI symptoms or recent illness. She has tried tylenol. She also used Excedrin Extra strength for her migraine. LMP 8/1/218 (33 days ago). She has irregular period. Not on birth control. Sexually active with one female partner. Denies condom use.  Patient reports uncomplicated termination of pregnancy about three months ago at about 7 weeks of gestation at doctors office.  Denies drinking alcohol or smoking cig or drug use. Denies new drug.   PMH/Problem List: has OBESITY; ECZEMA, ATOPIC DERMATITIS; Menorrhagia; HSV-2 (herpes simplex virus 2) infection; Sickle cell trait (HCC); Vaginal discharge; Concern about STD in female without diagnosis; and Routine adult health maintenance on her problem list.   has a past medical history of Abnormal Pap smear; Headache; Herpes simplex type II infection; History of chlamydia infection; Infection; Pregnancy induced hypertension; and Sickle cell trait (HCC).  FH:  Family History  Problem Relation Age of Onset  . Diabetes Maternal Grandmother   . Hypertension Mother   . Asthma Mother   . Heart disease Maternal Grandfather   . Asthma Sister   . Asthma Brother   . Cancer Maternal Aunt     SH Social History  Substance Use Topics  . Smoking status: Former Games developermoker  . Smokeless tobacco: Never Used     Comment: March 2015  . Alcohol use  No    Review of Systems Review of systems negative except for pertinent positives and negatives in history of present illness above.     Objective:     Vitals:   03/14/17 0935  BP: 122/86  Pulse: 77  Temp: 97.6 F (36.4 C)  Weight: 194 lb 12.8 oz (88.4 kg)   Body mass index is 33.44 kg/m.  Physical Exam GEN: appears well, no apparent distress. CVS: RRR, nl S1&S2, no murmurs, no edema RESP: no IWOB, good air movement bilaterally, CTAB GI: BS present & normal, soft, NTND GU: mild diffuse suprapubic tenderness bilaterally, no CVA tenderness External genitalia: normal without surrounding skin lesion, obvious discharge or bleeeding.  Speculum: pink vaginal mucosa, ruggated, normal cervix without apparent discharge. Bimanual: no cervical motion tenderness or adnexal mass. Uterus appears normal size MSK: no focal tenderness or notable swelling SKIN: no apparent skin lesion NEURO: alert and oiented appropriately, no gross deficits  PSYCH: euthymic mood with congruent affect Assessment and Plan:  1. Positive pregnancy test: this is unplanned pregnancy. However, she would like to keep it. She had termination about three months ago.  - Culture, OB Urine - POCT 1 Hr Prenatal Glucose - US OB Transvaginal; Future - Sickle cell screen - Obstetric Panel, Including HIV - US OB Comp Less 14 Wks; Future - Cervicovaginal ancillary only - POCT urinalysis dipstick - Prenatal Vit-Fe Fumarate-FA (PRENATAL VITAMIN) 27-0.8 MG TABS; Take 1 tablet by mouth daily.  Dispense: 180 tablet; Refill: 3  2. Morning sickness: will try Pyridoxine first. If no improvement  with this, gave her Rx for promethazine. Warned about sedation and drowsiness. Encouraged her to keep herself hydrated and eat frequent small meal.  - Pyridoxine HCl (VITAMIN B-6) 250 MG tablet; Take 1 tablet (250 mg total) by mouth daily.  Dispense: 30 tablet; Refill: 0 - promethazine (PHENERGAN) 25 MG tablet; Take 1 tablet (25 mg total)  by mouth every 8 (eight) hours as needed for nausea or vomiting.  Dispense: 30 tablet; Refill: 0  3. Missed period - POCT urine pregnancy  4. Bacterial vaginosis: wet prep with some clue cells.  - metroNIDAZOLE (METROGEL VAGINAL) 0.75 % vaginal gel; Place 1 Applicatorful vaginally 2 (two) times daily.  Dispense: 70 g; Refill: 0  Return in about 2 weeks (around 03/28/2017) for Prenatal visit.  Almon Hercules, MD 03/14/17 Pager: [phone]

## 2017-03-14 NOTE — Patient Instructions (Addendum)
It was great seeing you today! We have addressed the following issues today 1. Pregnancy: your urine pregnancy test is positive today. We sent a prescription for prenatal vitamins to the pharmacy. Please pick up this prescription and start taking it today. For nausea and vomiting, you can try pyridoxine (vitamin B6 tablets daily. If no improvement with this, you can fill the prescription for promethazine. This may make you drowsy and sleepy if you are driving. Please come back and see [place] for you initial prenatal visit.   If we did any lab work today, and the results require attention, either me or my nurse will get in touch with you. If everything is normal, you will get a letter in mail and a message via . If you don't hear from [place] in two weeks, please give [place] a call. Otherwise, we look forward to seeing you again at your next visit. If you have any questions or concerns before then, please call the clinic at [phone].  Please bring all your medications to every doctors visit  Sign up for My Chart to have easy access to your labs results, and communication with your Primary care physician.    Please check-out at the front desk before leaving the clinic.    Take Care,   Dr. Alanda Slim  First Trimester of Pregnancy The first trimester of pregnancy is from week 1 until the end of week 13 (months 1 through 3). During this time, your baby will begin to develop inside you. At 6-8 weeks, the eyes and face are formed, and the heartbeat can be seen on ultrasound. At the end of 12 weeks, all the baby's organs are formed. Prenatal care is all the medical care you receive before the birth of your baby. Make sure you get good prenatal care and follow all of your doctor's instructions. Follow these instructions at home: Medicines  Take over-the-counter and prescription medicines only as told by your doctor. Some medicines are safe and some medicines are not safe during pregnancy.  Take a prenatal vitamin  that contains at least 600 micrograms (mcg) of folic acid.  If you have trouble pooping (constipation), take medicine that will make your stool soft (stool softener) if your doctor approves. Eating and drinking  Eat regular, healthy meals.  Your doctor will tell you the amount of weight gain that is right for you.  Avoid raw meat and uncooked cheese.  If you feel sick to your stomach (nauseous) or throw up (vomit): ? Eat 4 or 5 small meals a day instead of 3 large meals. ? Try eating a few soda crackers. ? Drink liquids between meals instead of during meals.  To prevent constipation: ? Eat foods that are high in fiber, like fresh fruits and vegetables, whole grains, and beans. ? Drink enough fluids to keep your pee (urine) clear or pale yellow. Activity  Exercise only as told by your doctor. Stop exercising if you have cramps or pain in your lower belly (abdomen) or low back.  Do not exercise if it is too hot, too humid, or if you are in a place of great height (high altitude).  Try to avoid standing for long periods of time. Move your legs often if you must stand in one place for a long time.  Avoid heavy lifting.  Wear low-heeled shoes. Sit and stand up straight.  You can have sex unless your doctor tells you not to. Relieving pain and discomfort  Wear a good support bra  if your breasts are sore.  Take warm water baths (sitz baths) to soothe pain or discomfort caused by hemorrhoids. Use hemorrhoid cream if your doctor says it is okay.  Rest with your legs raised if you have leg cramps or low back pain.  If you have puffy, bulging veins (varicose veins) in your legs: ? Wear support hose or compression stockings as told by your doctor. ? Raise (elevate) your feet for 15 minutes, 3-4 times a day. ? Limit salt in your food. Prenatal care  Schedule your prenatal visits by the twelfth week of pregnancy.  Write down your questions. Take them to your prenatal visits.  Keep  all your prenatal visits as told by your doctor. This is important. Safety  Wear your seat belt at all times when driving.  Make a list of emergency phone numbers. The list should include numbers for family, friends, the hospital, and police and fire departments. General instructions  Ask your doctor for a referral to a local prenatal class. Begin classes no later than at the start of month 6 of your pregnancy.  Ask for help if you need counseling or if you need help with nutrition. Your doctor can give you advice or tell you where to go for help.  Do not use hot tubs, steam rooms, or saunas.  Do not douche or use tampons or scented sanitary pads.  Do not cross your legs for long periods of time.  Avoid all herbs and alcohol. Avoid drugs that are not approved by your doctor.  Do not use any tobacco products, including cigarettes, chewing tobacco, and electronic cigarettes. If you need help quitting, ask your doctor. You may get counseling or other support to help you quit.  Avoid cat litter boxes and soil used by cats. These carry germs that can cause birth defects in the baby and can cause a loss of your baby (miscarriage) or stillbirth.  Visit your dentist. At home, brush your teeth with a soft toothbrush. Be gentle when you floss. Contact a doctor if:  You are dizzy.  You have mild cramps or pressure in your lower belly.  You have a nagging pain in your belly area.  You continue to feel sick to your stomach, you throw up, or you have watery poop (diarrhea).  You have a bad smelling fluid coming from your vagina.  You have pain when you pee (urinate).  You have increased puffiness (swelling) in your face, hands, legs, or ankles. Get help right away if:  You have a fever.  You are leaking fluid from your vagina.  You have spotting or bleeding from your vagina.  You have very bad belly cramping or pain.  You gain or lose weight rapidly.  You throw up blood. It may  look like coffee grounds.  You are around people who have [place] measles, fifth disease, or chickenpox.  You have a very bad headache.  You have shortness of breath.  You have any kind of trauma, such as from a fall or a car accident. Summary  The first trimester of pregnancy is from week 1 until the end of week 13 (months 1 through 3).  To take care of yourself and your unborn baby, you will need to eat healthy meals, take medicines only if your doctor tells you to do so, and do activities that are safe for you and your baby.  Keep all follow-up visits as told by your doctor. This is important as your doctor will  have to ensure that your baby is healthy and growing well. This information is not intended to replace advice given to you by your health care provider. Make sure you discuss any questions you have with your health care provider. Document Released: 12/14/2007 Document Revised: 07/05/2016 Document Reviewed: 07/05/2016 Elsevier Interactive Patient Education  2017 [employer]

## 2017-03-15 LAB — OBSTETRIC PANEL, INCLUDING HIV
Antibody Screen: NEGATIVE
BASOS ABS: 0.1 10*3/uL (ref 0.0–0.2)
BASOS: 1 %
EOS (ABSOLUTE): 0.2 10*3/uL (ref 0.0–0.4)
Eos: 2 %
HEMATOCRIT: 37 % (ref 34.0–46.6)
HIV Screen 4th Generation wRfx: NONREACTIVE
Hemoglobin: 11.3 g/dL (ref 11.1–15.9)
Hepatitis B Surface Ag: NEGATIVE
IMMATURE GRANS (ABS): 0 10*3/uL (ref 0.0–0.1)
IMMATURE GRANULOCYTES: 0 %
Lymphocytes Absolute: 2.9 10*3/uL (ref 0.7–3.1)
Lymphs: 26 %
MCH: 21.6 pg — ABNORMAL LOW (ref 26.6–33.0)
MCHC: 30.5 g/dL — ABNORMAL LOW (ref 31.5–35.7)
MCV: 71 fL — ABNORMAL LOW (ref 79–97)
MONOCYTES: 6 %
MONOS ABS: 0.6 10*3/uL (ref 0.1–0.9)
Neutrophils Absolute: 7.4 10*3/uL — ABNORMAL HIGH (ref 1.4–7.0)
Neutrophils: 65 %
PLATELETS: 296 10*3/uL (ref 150–379)
RBC: 5.23 x10E6/uL (ref 3.77–5.28)
RDW: 18 % — AB (ref 12.3–15.4)
RPR Ser Ql: NONREACTIVE
RUBELLA: 20 {index} (ref >0.99)
Rh Factor: POSITIVE
WBC: 11.1 10*3/uL — ABNORMAL HIGH (ref 3.4–10.8)

## 2017-03-15 LAB — SICKLE CELL SCREEN: SICKLE CELL SCREEN: POSITIVE — AB

## 2017-03-16 LAB — URINE CULTURE, OB REFLEX

## 2017-03-16 LAB — CULTURE, OB URINE

## 2017-03-17 ENCOUNTER — Other Ambulatory Visit: Payer: Self-pay | Admitting: Student

## 2017-03-17 ENCOUNTER — Telehealth: Payer: Self-pay | Admitting: Student

## 2017-03-17 DIAGNOSIS — O210 Mild hyperemesis gravidarum: Secondary | ICD-10-CM

## 2017-03-17 LAB — CERVICOVAGINAL ANCILLARY ONLY
CHLAMYDIA, DNA PROBE: NEGATIVE
NEISSERIA GONORRHEA: NEGATIVE
Trichomonas: NEGATIVE

## 2017-03-17 MED ORDER — VITAMIN B-6 25 MG PO TABS: 25.0000 mg | ORAL_TABLET | Freq: Every day | ORAL | 0 refills | Status: DC

## 2017-03-17 NOTE — Telephone Encounter (Signed)
Attempted to call patient twice to discuss about her lab results from recent visit with me. Patient didn't pick up the phone. She has mild leukocytosis and positive sickle cell screen. Otherwise, within normal.

## 2017-03-18 ENCOUNTER — Telehealth: Payer: Self-pay | Admitting: Student

## 2017-03-18 NOTE — Telephone Encounter (Signed)
Called and discussed lab results with patient. Sickle cell screening positive. She has sickle cell trait. Worth discussing about risk in case partner has sickle cell trait. WBC slightly elevated likely stress induced in the absence of fever or other signs of infection. She continues to deny fever or urinary symptoms. Emesis and abdominal pain resolved.  Advised her to back off on pyridoxine to 25 mg daily as needed for nausea and vomiting. Patient voices understanding and appreciates the call. She has US in two days and first prenatal visit in 12 days.

## 2017-03-20 ENCOUNTER — Telehealth: Payer: Self-pay | Admitting: Student

## 2017-03-20 ENCOUNTER — Ambulatory Visit (HOSPITAL_COMMUNITY)
Admission: RE | Admit: 2017-03-20 | Discharge: 2017-03-20 | Disposition: A | Discharge status: Home / Self Care | Payer: Medicaid Other | Source: Ambulatory Visit | Attending: Family Medicine | Admitting: Family Medicine

## 2017-03-20 DIAGNOSIS — O9989 Other specified diseases and conditions complicating pregnancy, childbirth and the puerperium: Secondary | ICD-10-CM | POA: Insufficient documentation

## 2017-03-20 DIAGNOSIS — Z3A01 Less than 8 weeks gestation of pregnancy: Secondary | ICD-10-CM | POA: Insufficient documentation

## 2017-03-20 DIAGNOSIS — R001 Bradycardia, unspecified: Secondary | ICD-10-CM | POA: Diagnosis not present

## 2017-03-20 DIAGNOSIS — Z3201 Encounter for pregnancy test, result positive: Secondary | ICD-10-CM | POA: Insufficient documentation

## 2017-03-20 NOTE — Telephone Encounter (Signed)
Attempted to call patient to discuss about US finding. Ob US with IUP at 345w5d which is consistent with LMP. Embryonic bradycardia to 74/min noted. Follow up US in 4 weeks has been recommended. Will forward this note to PCP who will see the patient in 10 days for initial OB visit.

## 2017-03-30 ENCOUNTER — Encounter: Payer: Medicaid Other | Admitting: Internal Medicine

## 2017-04-04 ENCOUNTER — Encounter: Payer: Self-pay | Admitting: Internal Medicine

## 2017-04-04 ENCOUNTER — Ambulatory Visit (INDEPENDENT_AMBULATORY_CARE_PROVIDER_SITE_OTHER): Payer: Medicaid Other | Admitting: Internal Medicine

## 2017-04-04 VITALS — BP 121/80 | HR 94 | Temp 97.7°F | Wt 197.0 lb

## 2017-04-04 DIAGNOSIS — Z3491 Encounter for supervision of normal pregnancy, unspecified, first trimester: Secondary | ICD-10-CM | POA: Insufficient documentation

## 2017-04-04 DIAGNOSIS — Z3481 Encounter for supervision of other normal pregnancy, first trimester: Secondary | ICD-10-CM

## 2017-04-04 DIAGNOSIS — O368390 Maternal care for abnormalities of the fetal heart rate or rhythm, unspecified trimester, not applicable or unspecified: Secondary | ICD-10-CM

## 2017-04-04 MED ORDER — DOXYLAMINE-PYRIDOXINE 10-10 MG PO TBEC: 2.0000 | DELAYED_RELEASE_TABLET | Freq: Every day | ORAL | 0 refills | Status: DC

## 2017-04-04 NOTE — Patient Instructions (Signed)
It was nice seeing you again today Natasha Smith!  For nausea, you can start taking Diclegis instead of Phenergan. Do not take them both. Start by taking 2 tablets of Diclegis at night. If you are still having nausea after taking this for two days, you can add another tablet in the morning (making 3 tablets total). If you are still having nausea after 2 additional days, you can add another tablet in the afternoon (making 4 tablets total). DO NOT TAKE MORE THAN 4 TABLETS IN A DAY.   It is VERY important to drink a lot of water. This will keep you from getting dehydrated and feeling dizzy. Try to keep water with you at all times and drink throughout the day.  Please continue to take your prenatal vitamins.   You will be contacted with the date and time of your ultrasound.   We will see you back in one month for your next appointment.   Please go to [facility] if you notice any of the following: - You have abdominal pain - You have vaginal bleeding - You are so nauseated or vomiting that you cannot keep down fluids  The address for [facility] is: [address]  [place], [place] [address] [phone]   If you have any questions or concerns, please feel free to call the clinic.   Be well,  Dr. Natale Milch   First Trimester of Pregnancy The first trimester of pregnancy is from week 1 until the end of week 13 (months 1 through 3). During this time, your baby will begin to develop inside you. At 6-8 weeks, the eyes and face are formed, and the heartbeat can be seen on ultrasound. At the end of 12 weeks, all the baby's organs are formed. Prenatal care is all the medical care you receive before the birth of your baby. Make sure you get good prenatal care and follow all of your doctor's instructions. Follow these instructions at home: Medicines  Take over-the-counter and prescription medicines only as told by your doctor. Some medicines are safe and some medicines are not safe during  pregnancy.  Take a prenatal vitamin that contains at least 600 micrograms (mcg) of folic acid.  If you have trouble pooping (constipation), take medicine that will make your stool soft (stool softener) if your doctor approves. Eating and drinking  Eat regular, healthy meals.  Your doctor will tell you the amount of weight gain that is right for you.  Avoid raw meat and uncooked cheese.  If you feel sick to your stomach (nauseous) or throw up (vomit): ? Eat 4 or 5 small meals a day instead of 3 large meals. ? Try eating a few soda crackers. ? Drink liquids between meals instead of during meals.  To prevent constipation: ? Eat foods that are high in fiber, like fresh fruits and vegetables, whole grains, and beans. ? Drink enough fluids to keep your pee (urine) clear or pale yellow. Activity  Exercise only as told by your doctor. Stop exercising if you have cramps or pain in your lower belly (abdomen) or low back.  Do not exercise if it is too hot, too humid, or if you are in a place of great height (high altitude).  Try to avoid standing for long periods of time. Move your legs often if you must stand in one place for a long time.  Avoid heavy lifting.  Wear low-heeled shoes. Sit and stand up straight.  You can have sex unless your  doctor tells you not to. Relieving pain and discomfort  Wear a good support bra if your breasts are sore.  Take warm water baths (sitz baths) to soothe pain or discomfort caused by hemorrhoids. Use hemorrhoid cream if your doctor says it is okay.  Rest with your legs raised if you have leg cramps or low back pain.  If you have puffy, bulging veins (varicose veins) in your legs: ? Wear support hose or compression stockings as told by your doctor. ? Raise (elevate) your feet for 15 minutes, 3-4 times a day. ? Limit salt in your food. Prenatal care  Schedule your prenatal visits by the twelfth week of pregnancy.  Write down your questions. Take  them to your prenatal visits.  Keep all your prenatal visits as told by your doctor. This is important. Safety  Wear your seat belt at all times when driving.  Make a list of emergency phone numbers. The list should include numbers for family, friends, the hospital, and police and fire departments. General instructions  Ask your doctor for a referral to a local prenatal class. Begin classes no later than at the start of month 6 of your pregnancy.  Ask for help if you need counseling or if you need help with nutrition. Your doctor can give you advice or tell you where to go for help.  Do not use hot tubs, steam rooms, or saunas.  Do not douche or use tampons or scented sanitary pads.  Do not cross your legs for long periods of time.  Avoid all herbs and alcohol. Avoid drugs that are not approved by your doctor.  Do not use any tobacco products, including cigarettes, chewing tobacco, and electronic cigarettes. If you need help quitting, ask your doctor. You may get counseling or other support to help you quit.  Avoid cat litter boxes and soil used by cats. These carry germs that can cause birth defects in the baby and can cause a loss of your baby (miscarriage) or stillbirth.  Visit your dentist. At home, brush your teeth with a soft toothbrush. Be gentle when you floss. Contact a doctor if:  You are dizzy.  You have mild cramps or pressure in your lower belly.  You have a nagging pain in your belly area.  You continue to feel sick to your stomach, you throw up, or you have watery poop (diarrhea).  You have a bad smelling fluid coming from your vagina.  You have pain when you pee (urinate).  You have increased puffiness (swelling) in your face, hands, legs, or ankles. Get help right away if:  You have a fever.  You are leaking fluid from your vagina.  You have spotting or bleeding from your vagina.  You have very bad belly cramping or pain.  You gain or lose weight  rapidly.  You throw up blood. It may look like coffee grounds.  You are around people who have [place] measles, fifth disease, or chickenpox.  You have a very bad headache.  You have shortness of breath.  You have any kind of trauma, such as from a fall or a car accident. Summary  The first trimester of pregnancy is from week 1 until the end of week 13 (months 1 through 3).  To take care of yourself and your unborn baby, you will need to eat healthy meals, take medicines only if your doctor tells you to do so, and do activities that are safe for you and your baby.  Keep  all follow-up visits as told by your doctor. This is important as your doctor will have to ensure that your baby is healthy and growing well. This information is not intended to replace advice given to you by your health care provider. Make sure you discuss any questions you have with your health care provider. Document Released: 12/14/2007 Document Revised: 07/05/2016 Document Reviewed: 07/05/2016 Elsevier Interactive Patient Education  2017 [employer].

## 2017-04-04 NOTE — Progress Notes (Signed)
Natasha Smith is a 26 y.o. yo [phone] at [redacted]w[redacted]d who presents for her initial prenatal visit. PMH included gestational hypertension with two prior pregnancies not requiring medication, as well as sickle cell trait.  Pregnancy is not planned. Patient was on Depo but missed at least one dose.  She reports nausea. She  is taking PNV. See flow sheet for details.  Nausea Occurring almost daily. Was prescribed phenergan at last visit which has not been helping. Has been eating less due to nausea but is able to tolerate food. Sometimes symptoms last all day. Has also reported some dizziness which improves when laying down.   PMH, POBH, FH, meds, allergies and Social Hx reviewed.  Prenatal Exam: Gen: Well nourished, well developed.  No distress.  Vitals noted. HEENT: Normocephalic, atraumatic.  Neck supple without cervical lymphadenopathy, thyromegaly or thyroid nodules.  Fair dentition. CV: RRR no murmur, gallops or rubs Lungs: CTAB.  Normal respiratory effort without wheezes or rales. Abd: soft, NTND. +BS.  Uterus not appreciated above pelvis. GU: Performed at last visit one month ago with no abnormalities noted. Declining today.  Ext: No clubbing, cyanosis or edema. Psych: Normal grooming and dress.  Not depressed or anxious appearing.  Normal thought content and process without flight of ideas or looseness of associations.  Assessment & Plan: 1) 26 y.o. yo [id number] at [redacted]w[redacted]d via early ultrasound doing well.  Current pregnancy issues include nausea. Dating is reliable. Prenatal labs reviewed, notable for sickle cell trait. Genetic screening offered: patient considering. Early glucola is not indicated.  PHQ-9 and Pregnancy Medical Home forms completed and reviewed.  - Fetal bradycardia on early ultrasound Likely secondary to early gestational age (US performed at [facility]) but f/u ultrasound recommended in one month. [place] ordered today. Patient to be contacted with date and time of appt.    -  Nausea Not controlled with phenergan. Will discontinue phenergan and begin trial of Diclegis. Patient encouraged to drink plenty of fluids to remain hydrated and prevent dizziness. Return precautions discussed.   Bleeding and pain precautions reviewed. Importance of prenatal vitamins reviewed.  Follow up in 4 weeks.  Tarri Abernethy, MD, MPH PGY-3 [facility] Pager [phone]

## 2017-04-11 ENCOUNTER — Inpatient Hospital Stay (HOSPITAL_COMMUNITY)
Admission: AD | Admit: 2017-04-11 | Discharge: 2017-04-11 | Disposition: A | Discharge status: Home / Self Care | Payer: Medicaid Other | Source: Ambulatory Visit | Attending: Family Medicine | Admitting: Family Medicine

## 2017-04-11 DIAGNOSIS — O219 Vomiting of pregnancy, unspecified: Secondary | ICD-10-CM | POA: Diagnosis not present

## 2017-04-11 DIAGNOSIS — D573 Sickle-cell trait: Secondary | ICD-10-CM | POA: Insufficient documentation

## 2017-04-11 DIAGNOSIS — Z87891 Personal history of nicotine dependence: Secondary | ICD-10-CM | POA: Insufficient documentation

## 2017-04-11 DIAGNOSIS — Z3A08 8 weeks gestation of pregnancy: Secondary | ICD-10-CM | POA: Diagnosis not present

## 2017-04-11 DIAGNOSIS — O99011 Anemia complicating pregnancy, first trimester: Secondary | ICD-10-CM | POA: Diagnosis not present

## 2017-04-11 DIAGNOSIS — B009 Herpesviral infection, unspecified: Secondary | ICD-10-CM | POA: Insufficient documentation

## 2017-04-11 DIAGNOSIS — Z79899 Other long term (current) drug therapy: Secondary | ICD-10-CM | POA: Diagnosis not present

## 2017-04-11 DIAGNOSIS — Z7982 Long term (current) use of aspirin: Secondary | ICD-10-CM | POA: Diagnosis not present

## 2017-04-11 DIAGNOSIS — O218 Other vomiting complicating pregnancy: Secondary | ICD-10-CM | POA: Diagnosis not present

## 2017-04-11 DIAGNOSIS — O212 Late vomiting of pregnancy: Secondary | ICD-10-CM | POA: Diagnosis present

## 2017-04-11 DIAGNOSIS — O98511 Other viral diseases complicating pregnancy, first trimester: Secondary | ICD-10-CM | POA: Diagnosis not present

## 2017-04-11 LAB — URINALYSIS, ROUTINE W REFLEX MICROSCOPIC
Bilirubin Urine: NEGATIVE
GLUCOSE, UA: NEGATIVE mg/dL
Hgb urine dipstick: NEGATIVE
Ketones, ur: 20 mg/dL — AB
Nitrite: NEGATIVE
PH: 5 (ref 5.0–8.0)
PROTEIN: 30 mg/dL — AB
SPECIFIC GRAVITY, URINE: 1.023 (ref 1.005–1.030)

## 2017-04-11 MED ORDER — ONDANSETRON 4 MG PO TBDP: 4.0000 mg | ORAL_TABLET | Freq: Four times a day (QID) | ORAL | 2 refills | Status: DC | PRN

## 2017-04-11 MED ORDER — M.V.I. ADULT IV INJ
INJECTION | Freq: Once | INTRAVENOUS | Status: AC
  Administered 2017-04-11: 20:00:00 via INTRAVENOUS
  Filled 2017-04-11: qty 1000

## 2017-04-11 MED ORDER — LACTATED RINGERS IV SOLN
25.0000 mg | Freq: Once | INTRAVENOUS | Status: AC
  Administered 2017-04-11: 25 mg via INTRAVENOUS
  Filled 2017-04-11: qty 1

## 2017-04-11 MED ORDER — ONDANSETRON HCL 4 MG/2ML IJ SOLN
4.0000 mg | Freq: Once | INTRAMUSCULAR | Status: AC
  Administered 2017-04-11: 4 mg via INTRAVENOUS
  Filled 2017-04-11: qty 2

## 2017-04-11 NOTE — MAU Provider Note (Signed)
Chief Complaint: Emesis   First Provider Initiated Contact with Patient 04/11/17 2124      SUBJECTIVE HPI: Natasha Smith is a 26 y.o. [id number] at [redacted]w[redacted]d by LMP pt of [facility] who presents to maternity admissions reporting nausea and vomiting, making her unable to keep anything down for the last couple of days. She is prescribed Phenergan which she is taking but is not able to keep them down. She has not taken any medications today.  She reports chest burning/tightening associated with her vomiting.  There are no other associated symptoms.  She denies abdominal pain, vaginal bleeding, vaginal itching/burning, urinary symptoms, h/a, dizziness, or fever/chills.     HPI  Past Medical History:  Diagnosis Date  . Abnormal Pap smear   . Headache   . Herpes simplex type II infection   . History of chlamydia infection   . Infection    UTI  . Pregnancy induced hypertension    hx  . Sickle cell trait [facility])    Past Surgical History:  Procedure Laterality Date  . KNEE SURGERY     Social History   Social History  . Marital status: Single    Spouse name: N/A  . Number of children: N/A  . Years of education: N/A   Occupational History  . Not on file.   Social History Main Topics  . Smoking status: Former Games developer  . Smokeless tobacco: Never Used     Comment: March 2015  . Alcohol use No  . Drug use: No  . Sexual activity: Yes    Birth control/ protection: None   Other Topics Concern  . Not on file   Social History Narrative  . No narrative on file   No current facility-administered medications on file prior to encounter.    Current Outpatient Prescriptions on File Prior to Encounter  Medication Sig Dispense Refill  . aspirin 81 MG chewable tablet Chew 1 tablet (81 mg total) by mouth daily. 30 tablet 11  . Doxylamine-Pyridoxine 10-10 MG TBEC Take 2 tablets by mouth at bedtime. Add 1 tablet in morning if still nauseated after 2 days. Add 1 tablet in afternoon if still nauseated. 60  tablet 0  . ferrous sulfate 325 (65 FE) MG tablet Take 1 tablet (325 mg total) by mouth 2 (two) times daily with a meal. 60 tablet 11  . metroNIDAZOLE (METROGEL VAGINAL) 0.75 % vaginal gel Place 1 Applicatorful vaginally 2 (two) times daily. 70 g 0  . Prenatal Vit-Fe Fumarate-FA (PRENATAL VITAMIN) 27-0.8 MG TABS Take 1 tablet by mouth daily. 180 tablet 3  . promethazine (PHENERGAN) 25 MG tablet Take 1 tablet (25 mg total) by mouth every 8 (eight) hours as needed for nausea or vomiting. 30 tablet 0  . valACYclovir (VALTREX) 1000 MG tablet Take 1 tablet (1,000 mg total) by mouth daily. 30 tablet 3  . vitamin B-6 (PYRIDOXINE) 25 MG tablet Take 1 tablet (25 mg total) by mouth daily. 30 tablet 0   No Known Allergies  ROS:  Review of Systems  Constitutional: Negative for chills, fatigue and fever.  Respiratory: Positive for chest tightness. Negative for shortness of breath.   Cardiovascular: Negative for chest pain.  Gastrointestinal: Positive for nausea and vomiting.  Genitourinary: Negative for difficulty urinating, dysuria, flank pain, pelvic pain, vaginal bleeding, vaginal discharge and vaginal pain.  Neurological: Negative for dizziness and headaches.  Psychiatric/Behavioral: Negative.      I have reviewed patient's Past Medical Hx, Surgical Hx, Family Hx, Social Hx, medications and  allergies.   Physical Exam   Patient Vitals for the past 24 hrs:  BP Temp Pulse Resp SpO2 Height Weight  04/11/17 1701 123/81 (!) 97.5 F (36.4 C) 88 16 98 %  (1.626 m) 188 lb (85.3 kg)   Constitutional: Well-developed, well-nourished female in no acute distress.  Cardiovascular: normal rate Respiratory: normal effort GI: Abd soft, non-tender. Pos BS x 4 MS: Extremities nontender, no edema, normal ROM Neurologic: Alert and oriented x 4.  GU: Neg CVAT.  PELVIC EXAM: Deferred  LAB RESULTS Results for orders placed or performed during the hospital encounter of 04/11/17 (from the past 24 hour(s))   Urinalysis, Routine w reflex microscopic     Status: Abnormal   Collection Time: 04/11/17  5:03 PM  Result Value Ref Range   Color, Urine AMBER (A) YELLOW   APPearance CLOUDY (A) CLEAR   Specific Gravity, Urine 1.023 1.005 - 1.030   pH 5.0 5.0 - 8.0   Glucose, UA NEGATIVE NEGATIVE mg/dL   Hgb urine dipstick NEGATIVE NEGATIVE   Bilirubin Urine NEGATIVE NEGATIVE   Ketones, ur 20 (A) NEGATIVE mg/dL   Protein, ur 30 (A) NEGATIVE mg/dL   Nitrite NEGATIVE NEGATIVE   Leukocytes, UA LARGE (A) NEGATIVE   RBC / HPF 0-5 0 - 5 RBC/hpf   WBC, UA 6-30 0 - 5 WBC/hpf   Bacteria, UA RARE (A) NONE SEEN   Squamous Epithelial / LPF TOO NUMEROUS TO COUNT (A) NONE SEEN   Mucus PRESENT     A/Positive/-- (09/04 1046)  IMAGING US Ob Comp Less 14 Wks  Result Date: 03/20/2017 CLINICAL DATA:  26 year old pregnant female presents for assessment of fetal dating and viability. EDC by LMP: 08/08/2017, projecting to an expected gestational age of [redacted] weeks 6 days. EXAM: OBSTETRIC <14 WK [place] AND TRANSVAGINAL OB US TECHNIQUE: Both transabdominal and transvaginal ultrasound examinations were performed for complete evaluation of the gestation as well as the maternal uterus, adnexal regions, and pelvic cul-de-sac. Transvaginal technique was performed to assess early pregnancy. COMPARISON:  No prior scans from this gestation. FINDINGS: Intrauterine gestational sac: Single intrauterine gestational sac appears normal in size, shape and position. Yolk sac:  Visualized. Embryo:  Visualized. Embryonic Cardiac Activity: Embryonic bradycardia. Embryonic Heart Rate: 74  bpm CRL:  3.0  mm   5 w   5 d                  [place] EDC: 11/15/2017 Subchorionic hemorrhage:  None visualized. Maternal uterus/adnexae: Right ovary measures 3.1 x 1.7 x 2.9 cm. Left ovary measures 3.4 x 2.5 x 2.6 cm and contains a corpus luteum. No suspicious ovarian or adnexal masses. No uterine fibroids. No abnormal free fluid in the pelvis. IMPRESSION: 1. Single living  intrauterine gestation at 5 weeks 5 days by crown-rump length. Embryonic bradycardia (embryonic heart rate 74 bpm), which may be due to early gestational age. Consider follow-up obstetric scan in 4 weeks to assess for appropriate fetal growth. 2. No ovarian or adnexal abnormality. Electronically Signed   By: Delbert Phenix M.D.   On: 03/20/2017 08:46   US Ob Transvaginal  Result Date: 03/20/2017 CLINICAL DATA:  26 year old pregnant female presents for assessment of fetal dating and viability. EDC by LMP: 08/08/2017, projecting to an expected gestational age of [redacted] weeks 6 days. EXAM: OBSTETRIC <14 WK [place] AND TRANSVAGINAL OB US TECHNIQUE: Both transabdominal and transvaginal ultrasound examinations were performed for complete evaluation of the gestation as well as the maternal uterus, adnexal regions,  and pelvic cul-de-sac. Transvaginal technique was performed to assess early pregnancy. COMPARISON:  No prior scans from this gestation. FINDINGS: Intrauterine gestational sac: Single intrauterine gestational sac appears normal in size, shape and position. Yolk sac:  Visualized. Embryo:  Visualized. Embryonic Cardiac Activity: Embryonic bradycardia. Embryonic Heart Rate: 74  bpm CRL:  3.0  mm   5 w   5 d                  [place] EDC: 11/15/2017 Subchorionic hemorrhage:  None visualized. Maternal uterus/adnexae: Right ovary measures 3.1 x 1.7 x 2.9 cm. Left ovary measures 3.4 x 2.5 x 2.6 cm and contains a corpus luteum. No suspicious ovarian or adnexal masses. No uterine fibroids. No abnormal free fluid in the pelvis. IMPRESSION: 1. Single living intrauterine gestation at 5 weeks 5 days by crown-rump length. Embryonic bradycardia (embryonic heart rate 74 bpm), which may be due to early gestational age. Consider follow-up obstetric scan in 4 weeks to assess for appropriate fetal growth. 2. No ovarian or adnexal abnormality. Electronically Signed   By: Delbert Phenix M.D.   On: 03/20/2017 08:46    MAU Management/MDM: Ordered  labs and reviewed results.  Pt with significant improvement after Phenergan 25 mg IV in 1000 ml LR and a second bag of LR 1000 ml for hydration. She requested an additional medication before trying PO food/fluids so offered Zofran. Pt has tried Diclegis with no improvement. Discussed with the patient the risk of Zofran use in the first trimester of pregnancy. Risks of use in the first trimester include birth defects in babies, specifically cleft lip/palate.  Pt states understanding and plans to use Zofran sparingly.  Zofran 4 mg IV given in MAU. Pt tolerated PO food/fluids and was discharged with Rx for Zofran 4 mg ODT Q 8 hours PRN.  Pt may take Phenergan orally if tolerated or vaginally or rectally as needed.  F/U with prenatal care as scheduled. Pt stable at time of discharge.  ASSESSMENT 1. Nausea and vomiting during pregnancy prior to [redacted] weeks gestation     PLAN Discharge home Allergies as of 04/11/2017   No Known Allergies     Medication List    TAKE these medications   aspirin 81 MG chewable tablet Chew 1 tablet (81 mg total) by mouth daily.   Doxylamine-Pyridoxine 10-10 MG Tbec Take 2 tablets by mouth at bedtime. Add 1 tablet in morning if still nauseated after 2 days. Add 1 tablet in afternoon if still nauseated.   ferrous sulfate 325 (65 FE) MG tablet Take 1 tablet (325 mg total) by mouth 2 (two) times daily with a meal.   metroNIDAZOLE 0.75 % vaginal gel Commonly known as:  METROGEL VAGINAL Place 1 Applicatorful vaginally 2 (two) times daily.   ondansetron 4 MG disintegrating tablet Commonly known as:  ZOFRAN ODT Take 1 tablet (4 mg total) by mouth every 6 (six) hours as needed for nausea.   Prenatal Vitamin 27-0.8 MG Tabs Take 1 tablet by mouth daily.   promethazine 25 MG tablet Commonly known as:  PHENERGAN Take 1 tablet (25 mg total) by mouth every 8 (eight) hours as needed for nausea or vomiting.   valACYclovir 1000 MG tablet Commonly known as:  VALTREX Take 1  tablet (1,000 mg total) by mouth daily.   vitamin B-6 25 MG tablet Commonly known as:  pyridOXINE Take 1 tablet (25 mg total) by mouth daily.        Sharen Counter Certified Nurse-Midwife 04/11/2017  10:16 PM

## 2017-04-11 NOTE — MAU Note (Signed)
Pt reports constant vomiting for the last week, unable to keep anything down. Reports her chest feels tight from the vomiting. Unable to keep meds down.

## 2017-04-13 LAB — CULTURE, OB URINE

## 2017-05-08 ENCOUNTER — Ambulatory Visit (INDEPENDENT_AMBULATORY_CARE_PROVIDER_SITE_OTHER): Payer: Medicaid Other | Admitting: Internal Medicine

## 2017-05-08 VITALS — BP 120/70 | HR 89 | Temp 97.8°F | Wt 193.0 lb

## 2017-05-08 DIAGNOSIS — O368390 Maternal care for abnormalities of the fetal heart rate or rhythm, unspecified trimester, not applicable or unspecified: Secondary | ICD-10-CM

## 2017-05-08 MED ORDER — ONDANSETRON 4 MG PO TBDP
4.0000 mg | ORAL_TABLET | Freq: Four times a day (QID) | ORAL | 0 refills | Status: DC | PRN
Start: 2017-05-08 — End: 2017-08-09

## 2017-05-08 NOTE — Patient Instructions (Addendum)
It was nice seeing you today Ms. Dragos!  To schedule an appointment with one of our behavioral health counselors, please call our psychologist, Dr. Pascal LuxKane, at [phone]. If you have thoughts of hurting yourself or anyone else, please call 911 or go to the emergency room right away.   You can continue to take Zofran as needed for nausea and vomiting. If you are unable to keep down any liquids, please go to the emergency room again so you do not get dehydrated.   You will be called with the date and time of your ultrasound. If you are not contacted within the next week, please call to let me know.   Please go to [facility] if you notice any of the following: - You feel a gush of fluid (as if your water broke) - You begin having painful contractions approximately every 3-5 minutes apart that make it difficult to breathe or walk - You are not feeling your baby move - You have vaginal bleeding  The address for [facility] is: [address]  [place], [place] [address] [phone]   I will see you back in 4 weeks for your next prenatal appointment.   If you have any questions or concerns in the meantime, please feel free to call the clinic.   Be well,  Dr. Natale MilchLancaster
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
## 2017-05-09 ENCOUNTER — Telehealth: Payer: Self-pay | Admitting: Internal Medicine

## 2017-05-09 NOTE — Telephone Encounter (Signed)
Spoke with patient and advised her that OB US is scheduled 05/17/2017 @ 1:00 be there 12:45 [facility].

## 2017-05-17 ENCOUNTER — Other Ambulatory Visit (HOSPITAL_COMMUNITY): Payer: Medicaid Other

## 2017-05-17 ENCOUNTER — Ambulatory Visit (HOSPITAL_COMMUNITY): Admission: RE | Admit: 2017-05-17 | Payer: Medicaid Other | Source: Ambulatory Visit

## 2017-05-17 ENCOUNTER — Other Ambulatory Visit: Payer: Self-pay | Admitting: Internal Medicine

## 2017-05-17 ENCOUNTER — Ambulatory Visit (HOSPITAL_COMMUNITY)
Admission: RE | Admit: 2017-05-17 | Discharge: 2017-05-17 | Disposition: A | Discharge status: Home / Self Care | Payer: Medicaid Other | Source: Ambulatory Visit | Attending: Family Medicine | Admitting: Family Medicine

## 2017-05-17 DIAGNOSIS — O368390 Maternal care for abnormalities of the fetal heart rate or rhythm, unspecified trimester, not applicable or unspecified: Secondary | ICD-10-CM | POA: Diagnosis present

## 2017-05-17 DIAGNOSIS — Z6833 Body mass index (BMI) 33.0-33.9, adult: Secondary | ICD-10-CM | POA: Insufficient documentation

## 2017-05-17 DIAGNOSIS — Z3A14 14 weeks gestation of pregnancy: Secondary | ICD-10-CM | POA: Insufficient documentation

## 2017-05-17 DIAGNOSIS — Z641 Problems related to multiparity: Secondary | ICD-10-CM

## 2017-05-17 DIAGNOSIS — O99212 Obesity complicating pregnancy, second trimester: Secondary | ICD-10-CM

## 2017-05-17 DIAGNOSIS — O0942 Supervision of pregnancy with grand multiparity, second trimester: Secondary | ICD-10-CM | POA: Diagnosis not present

## 2017-05-17 DIAGNOSIS — O368320 Maternal care for abnormalities of the fetal heart rate or rhythm, second trimester, not applicable or unspecified: Secondary | ICD-10-CM | POA: Insufficient documentation

## 2017-05-18 ENCOUNTER — Other Ambulatory Visit (HOSPITAL_COMMUNITY): Payer: Medicaid Other

## 2017-06-04 ENCOUNTER — Inpatient Hospital Stay (HOSPITAL_COMMUNITY)
Admission: AD | Admit: 2017-06-04 | Discharge: 2017-06-05 | Disposition: A | Discharge status: Home / Self Care | Payer: Medicaid Other | Source: Ambulatory Visit | Attending: Obstetrics and Gynecology | Admitting: Obstetrics and Gynecology

## 2017-06-04 DIAGNOSIS — Z8249 Family history of ischemic heart disease and other diseases of the circulatory system: Secondary | ICD-10-CM | POA: Insufficient documentation

## 2017-06-04 DIAGNOSIS — Z3A16 16 weeks gestation of pregnancy: Secondary | ICD-10-CM | POA: Diagnosis not present

## 2017-06-04 DIAGNOSIS — Z87891 Personal history of nicotine dependence: Secondary | ICD-10-CM | POA: Diagnosis not present

## 2017-06-04 DIAGNOSIS — R109 Unspecified abdominal pain: Secondary | ICD-10-CM | POA: Insufficient documentation

## 2017-06-04 DIAGNOSIS — Z809 Family history of malignant neoplasm, unspecified: Secondary | ICD-10-CM | POA: Insufficient documentation

## 2017-06-04 DIAGNOSIS — Z79899 Other long term (current) drug therapy: Secondary | ICD-10-CM | POA: Insufficient documentation

## 2017-06-04 DIAGNOSIS — O26899 Other specified pregnancy related conditions, unspecified trimester: Secondary | ICD-10-CM

## 2017-06-04 DIAGNOSIS — O9989 Other specified diseases and conditions complicating pregnancy, childbirth and the puerperium: Secondary | ICD-10-CM

## 2017-06-04 DIAGNOSIS — O26892 Other specified pregnancy related conditions, second trimester: Secondary | ICD-10-CM | POA: Diagnosis not present

## 2017-06-04 DIAGNOSIS — Z825 Family history of asthma and other chronic lower respiratory diseases: Secondary | ICD-10-CM | POA: Insufficient documentation

## 2017-06-04 DIAGNOSIS — M791 Myalgia, unspecified site: Secondary | ICD-10-CM | POA: Diagnosis not present

## 2017-06-04 DIAGNOSIS — M549 Dorsalgia, unspecified: Secondary | ICD-10-CM | POA: Diagnosis not present

## 2017-06-04 DIAGNOSIS — R079 Chest pain, unspecified: Secondary | ICD-10-CM | POA: Diagnosis not present

## 2017-06-04 DIAGNOSIS — Z7982 Long term (current) use of aspirin: Secondary | ICD-10-CM | POA: Insufficient documentation

## 2017-06-04 DIAGNOSIS — Z833 Family history of diabetes mellitus: Secondary | ICD-10-CM | POA: Insufficient documentation

## 2017-06-04 DIAGNOSIS — R102 Pelvic and perineal pain: Secondary | ICD-10-CM

## 2017-06-04 DIAGNOSIS — D573 Sickle-cell trait: Secondary | ICD-10-CM | POA: Diagnosis not present
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
## 2017-06-05 ENCOUNTER — Encounter (HOSPITAL_COMMUNITY): Payer: Self-pay

## 2017-06-05 DIAGNOSIS — R102 Pelvic and perineal pain: Secondary | ICD-10-CM | POA: Diagnosis not present

## 2017-06-05 DIAGNOSIS — M549 Dorsalgia, unspecified: Secondary | ICD-10-CM | POA: Diagnosis not present

## 2017-06-05 DIAGNOSIS — O9989 Other specified diseases and conditions complicating pregnancy, childbirth and the puerperium: Secondary | ICD-10-CM | POA: Diagnosis not present

## 2017-06-05 DIAGNOSIS — Z3A16 16 weeks gestation of pregnancy: Secondary | ICD-10-CM | POA: Diagnosis not present

## 2017-06-05 DIAGNOSIS — O26892 Other specified pregnancy related conditions, second trimester: Secondary | ICD-10-CM

## 2017-06-05 LAB — URINALYSIS, ROUTINE W REFLEX MICROSCOPIC
BACTERIA UA: NONE SEEN
BILIRUBIN URINE: NEGATIVE
Glucose, UA: NEGATIVE mg/dL
Hgb urine dipstick: NEGATIVE
KETONES UR: NEGATIVE mg/dL
Nitrite: NEGATIVE
Protein, ur: NEGATIVE mg/dL
SPECIFIC GRAVITY, URINE: 1.012 (ref 1.005–1.030)
pH: 6 (ref 5.0–8.0)

## 2017-06-05 NOTE — Discharge Instructions (Signed)
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
## 2017-06-08 ENCOUNTER — Ambulatory Visit (INDEPENDENT_AMBULATORY_CARE_PROVIDER_SITE_OTHER): Payer: Medicaid Other | Admitting: Internal Medicine

## 2017-06-08 ENCOUNTER — Other Ambulatory Visit: Payer: Self-pay

## 2017-06-08 VITALS — BP 101/80 | HR 86 | Temp 97.9°F | Wt 192.0 lb

## 2017-06-08 DIAGNOSIS — Z3482 Encounter for supervision of other normal pregnancy, second trimester: Secondary | ICD-10-CM

## 2017-06-08 MED ORDER — SERTRALINE HCL 50 MG PO TABS: 50.0000 mg | ORAL_TABLET | Freq: Every day | ORAL | 1 refills | Status: DC
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
## 2017-06-08 NOTE — Patient Instructions (Addendum)
It was nice seeing you today Natasha Smith!  Please begin taking sertraline (Zoloft) one tablet daily. This can take a few weeks to work, so be sure to continue taking this even if you do not notice a difference right away. If you have thoughts of hurting yourself or someone else, please call our office, call 911, or go to the emergency room right away.   We will call you with the date and time of your ultrasound appointment.   Please call the [facility] to schedule an appointment with their pregnancy behavioral health counselor. Her name is Asher MuirJamie. Their number is [phone].   Please go to [facility] if you notice any of the following: - You feel a gush of fluid (as if your water broke) - You begin having painful contractions approximately every 3-5 minutes apart that make it difficult to breathe or walk - You are not feeling your baby move - You have vaginal bleeding  The address for [facility] is: [address]  [place], [place] [address] [phone]   I will see you back in 4 weeks for your next prenatal appointment.   If you have any questions or concerns in the meantime, please feel free to call the clinic.   Be well,  Dr. Natale MilchLancaster

## 2017-06-09 ENCOUNTER — Telehealth: Payer: Self-pay | Admitting: Psychology

## 2017-06-09 NOTE — Telephone Encounter (Signed)
Patient called to request an appointment to be seen for depression.  Reviewed Dr. Vira BlancoLancaster's note and saw recommendation for [facility] at [facility].  Provided the patient with the name, title, and phone number and asked her to follow-up there.  I can schedule [facility] at the [facility] but suspect Dr. Natale MilchLancaster thought Asher MuirJamie would be a good match.  Asked patient to call me back if she had any difficulty getting seen there.  Will let Dr. Natale MilchLancaster know.

## 2017-06-19 ENCOUNTER — Ambulatory Visit (HOSPITAL_COMMUNITY): Payer: Medicaid Other | Attending: Family Medicine

## 2017-06-23 ENCOUNTER — Ambulatory Visit (HOSPITAL_COMMUNITY)
Admission: RE | Admit: 2017-06-23 | Discharge: 2017-06-23 | Disposition: A | Discharge status: Home / Self Care | Payer: Medicaid Other | Source: Ambulatory Visit | Attending: Family Medicine | Admitting: Family Medicine

## 2017-06-23 ENCOUNTER — Other Ambulatory Visit: Payer: Self-pay | Admitting: Internal Medicine

## 2017-06-23 DIAGNOSIS — Z3482 Encounter for supervision of other normal pregnancy, second trimester: Secondary | ICD-10-CM

## 2017-06-23 DIAGNOSIS — Z363 Encounter for antenatal screening for malformations: Secondary | ICD-10-CM

## 2017-06-23 DIAGNOSIS — O99212 Obesity complicating pregnancy, second trimester: Secondary | ICD-10-CM

## 2017-06-23 DIAGNOSIS — Z3A19 19 weeks gestation of pregnancy: Secondary | ICD-10-CM | POA: Insufficient documentation

## 2017-06-26 ENCOUNTER — Telehealth: Payer: Self-pay | Admitting: Internal Medicine

## 2017-06-26 NOTE — Telephone Encounter (Signed)
Reviewed results of ultrasound. Repeat ultrasound to better view anatomy recommended in 4 weeks. Will discuss with patient at next prenatal appt.   Tarri AbernethyAbigail J Rider Ermis, MD, MPH PGY-3 [facility] Pager [phone]

## 2017-07-07 ENCOUNTER — Ambulatory Visit (INDEPENDENT_AMBULATORY_CARE_PROVIDER_SITE_OTHER): Payer: Medicaid Other | Admitting: Internal Medicine

## 2017-07-07 ENCOUNTER — Other Ambulatory Visit: Payer: Self-pay

## 2017-07-07 VITALS — BP 118/64 | HR 94 | Temp 97.9°F | Wt 192.6 lb

## 2017-07-07 DIAGNOSIS — Z3482 Encounter for supervision of other normal pregnancy, second trimester: Secondary | ICD-10-CM

## 2017-07-07 DIAGNOSIS — Z3A21 21 weeks gestation of pregnancy: Secondary | ICD-10-CM

## 2017-07-07 NOTE — Patient Instructions (Addendum)
It was nice seeing you today [name]!  I would recommend starting Zoloft 50 mg daily as we discussed at your last visit.   [facility] to [facility] if you notice any of the following: - You feel a gush of fluid (as if your water broke) - You begin having painful contractions approximately every 3-5 minutes apart that make it difficult to breathe or walk - You are not feeling your baby move - You have vaginal bleeding  The address for [facility] is: [address]  [place], [place] [address] [phone]   I will see you back in 4 weeks for your next prenatal appointment.   Please be sure to go to your ultrasound appointment as well.   If you have any questions or concerns in the meantime, please feel free to call the clinic.   Be well,  Dr. Natale MilchLancaster
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
## 2017-07-17 ENCOUNTER — Ambulatory Visit (HOSPITAL_COMMUNITY)
Admission: RE | Admit: 2017-07-17 | Discharge: 2017-07-17 | Disposition: A | Discharge status: Home / Self Care | Payer: Medicaid Other | Source: Ambulatory Visit | Attending: Internal Medicine | Admitting: Internal Medicine

## 2017-07-17 ENCOUNTER — Other Ambulatory Visit: Payer: Self-pay | Admitting: Internal Medicine

## 2017-07-17 DIAGNOSIS — O99212 Obesity complicating pregnancy, second trimester: Secondary | ICD-10-CM | POA: Diagnosis not present

## 2017-07-17 DIAGNOSIS — Z3A21 21 weeks gestation of pregnancy: Secondary | ICD-10-CM

## 2017-07-17 DIAGNOSIS — Z6833 Body mass index (BMI) 33.0-33.9, adult: Secondary | ICD-10-CM | POA: Insufficient documentation

## 2017-07-17 DIAGNOSIS — Z3A22 22 weeks gestation of pregnancy: Secondary | ICD-10-CM | POA: Diagnosis not present

## 2017-07-17 DIAGNOSIS — O0940 Supervision of pregnancy with grand multiparity, unspecified trimester: Secondary | ICD-10-CM | POA: Diagnosis not present

## 2017-07-17 DIAGNOSIS — Z362 Encounter for other antenatal screening follow-up: Secondary | ICD-10-CM | POA: Diagnosis present

## 2017-08-09 ENCOUNTER — Encounter: Payer: Self-pay | Admitting: Internal Medicine

## 2017-08-09 ENCOUNTER — Ambulatory Visit (INDEPENDENT_AMBULATORY_CARE_PROVIDER_SITE_OTHER): Payer: Medicaid Other | Admitting: Internal Medicine

## 2017-08-09 VITALS — BP 115/65 | HR 90 | Temp 97.5°F | Wt 195.8 lb

## 2017-08-09 DIAGNOSIS — Z3482 Encounter for supervision of other normal pregnancy, second trimester: Secondary | ICD-10-CM
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
## 2017-08-09 NOTE — Patient Instructions (Addendum)
It was nice seeing you today Natasha Smith!  I am glad to hear that your mood has improved! Please keep going to the therapy classes and writing things down if you feel this is helpful. If you become concerned about your mood again, please call to let me know or schedule an appointment.   Please go to [facility] if you notice any of the following: - You feel a gush of fluid (as if your water broke) - You begin having painful contractions approximately every 3-5 minutes apart that make it difficult to breathe or walk - You are not feeling your baby move - You have vaginal bleeding  The address for [facility] is: [address]  [place], [place] [address] [phone]   I will see you back in 4 weeks for your next prenatal appointment.   If you have any questions or concerns in the meantime, please feel free to call the clinic.   Be well,  Dr. Natale MilchLancaster

## 2017-09-03 ENCOUNTER — Inpatient Hospital Stay (HOSPITAL_COMMUNITY)
Admission: AD | Admit: 2017-09-03 | Discharge: 2017-09-03 | Disposition: A | Discharge status: Home / Self Care | Payer: Medicaid Other | Source: Ambulatory Visit | Attending: Obstetrics and Gynecology | Admitting: Obstetrics and Gynecology

## 2017-09-03 ENCOUNTER — Encounter (HOSPITAL_COMMUNITY): Payer: Self-pay

## 2017-09-03 DIAGNOSIS — M549 Dorsalgia, unspecified: Secondary | ICD-10-CM | POA: Diagnosis not present

## 2017-09-03 DIAGNOSIS — O99119 Other diseases of the blood and blood-forming organs and certain disorders involving the immune mechanism complicating pregnancy, unspecified trimester: Secondary | ICD-10-CM

## 2017-09-03 DIAGNOSIS — O26893 Other specified pregnancy related conditions, third trimester: Secondary | ICD-10-CM | POA: Insufficient documentation

## 2017-09-03 DIAGNOSIS — D696 Thrombocytopenia, unspecified: Secondary | ICD-10-CM

## 2017-09-03 DIAGNOSIS — O212 Late vomiting of pregnancy: Secondary | ICD-10-CM | POA: Diagnosis not present

## 2017-09-03 DIAGNOSIS — O219 Vomiting of pregnancy, unspecified: Secondary | ICD-10-CM

## 2017-09-03 DIAGNOSIS — Z87891 Personal history of nicotine dependence: Secondary | ICD-10-CM | POA: Insufficient documentation

## 2017-09-03 DIAGNOSIS — Z3A29 29 weeks gestation of pregnancy: Secondary | ICD-10-CM | POA: Diagnosis not present

## 2017-09-03 DIAGNOSIS — O9989 Other specified diseases and conditions complicating pregnancy, childbirth and the puerperium: Secondary | ICD-10-CM | POA: Diagnosis not present

## 2017-09-03 DIAGNOSIS — O99113 Other diseases of the blood and blood-forming organs and certain disorders involving the immune mechanism complicating pregnancy, third trimester: Secondary | ICD-10-CM

## 2017-09-03 DIAGNOSIS — O99891 Other specified diseases and conditions complicating pregnancy: Secondary | ICD-10-CM

## 2017-09-03 LAB — COMPREHENSIVE METABOLIC PANEL
ALBUMIN: 3.4 g/dL — AB (ref 3.5–5.0)
ALT: 12 U/L — AB (ref 14–54)
AST: 17 U/L (ref 15–41)
Alkaline Phosphatase: 119 U/L (ref 38–126)
Anion gap: 10 (ref 5–15)
BILIRUBIN TOTAL: 0.4 mg/dL (ref 0.3–1.2)
CHLORIDE: 104 mmol/L (ref 101–111)
CO2: 20 mmol/L — ABNORMAL LOW (ref 22–32)
Calcium: 9.1 mg/dL (ref 8.9–10.3)
Creatinine, Ser: 0.61 mg/dL (ref 0.44–1.00)
GFR calc Af Amer: >60 mL/min (ref >60)
GFR calc non Af Amer: >60 mL/min (ref >60)
GLUCOSE: 78 mg/dL (ref 65–99)
POTASSIUM: 3.3 mmol/L — AB (ref 3.5–5.1)
Sodium: 134 mmol/L — ABNORMAL LOW (ref 135–145)
Total Protein: 6.9 g/dL (ref 6.5–8.1)

## 2017-09-03 LAB — URINALYSIS, ROUTINE W REFLEX MICROSCOPIC
Bilirubin Urine: NEGATIVE
GLUCOSE, UA: 50 mg/dL — AB
Hgb urine dipstick: NEGATIVE
Ketones, ur: 20 mg/dL — AB
NITRITE: NEGATIVE
PH: 5 (ref 5.0–8.0)
Protein, ur: 30 mg/dL — AB
SPECIFIC GRAVITY, URINE: 1.016 (ref 1.005–1.030)

## 2017-09-03 LAB — WET PREP, GENITAL
SPERM: NONE SEEN
TRICH WET PREP: NONE SEEN
Yeast Wet Prep HPF POC: NONE SEEN

## 2017-09-03 LAB — CBC
HEMATOCRIT: 32 % — AB (ref 36.0–46.0)
Hemoglobin: 10.7 g/dL — ABNORMAL LOW (ref 12.0–15.0)
MCH: 23.3 pg — ABNORMAL LOW (ref 26.0–34.0)
MCHC: 33.4 g/dL (ref 30.0–36.0)
MCV: 69.6 fL — ABNORMAL LOW (ref 78.0–100.0)
PLATELETS: 137 10*3/uL — AB (ref 150–400)
RBC: 4.6 MIL/uL (ref 3.87–5.11)
RDW: 15.9 % — AB (ref 11.5–15.5)
WBC: 11.1 10*3/uL — AB (ref 4.0–10.5)

## 2017-09-03 LAB — PROTEIN / CREATININE RATIO, URINE
Creatinine, Urine: 213 mg/dL
Protein Creatinine Ratio: 0.18 mg/mg{Cre} — ABNORMAL HIGH (ref 0.00–0.15)
Total Protein, Urine: 38 mg/dL

## 2017-09-03 MED ORDER — PROMETHAZINE HCL 25 MG PO TABS
25.0000 mg | ORAL_TABLET | Freq: Once | ORAL | Status: AC
  Administered 2017-09-03: 25 mg via ORAL
  Filled 2017-09-03: qty 1

## 2017-09-03 MED ORDER — ONDANSETRON HCL 4 MG/2ML IJ SOLN
4.0000 mg | Freq: Once | INTRAMUSCULAR | Status: AC
  Administered 2017-09-03: 4 mg via INTRAVENOUS
  Filled 2017-09-03: qty 2

## 2017-09-03 MED ORDER — PROMETHAZINE HCL 25 MG PO TABS: 12.5000 mg | ORAL_TABLET | Freq: Four times a day (QID) | ORAL | 0 refills | Status: DC | PRN

## 2017-09-03 MED ORDER — CYCLOBENZAPRINE HCL 10 MG PO TABS: 10.0000 mg | ORAL_TABLET | Freq: Two times a day (BID) | ORAL | 0 refills | Status: DC | PRN

## 2017-09-03 MED ORDER — ACETAMINOPHEN 500 MG PO TABS
1000.0000 mg | ORAL_TABLET | Freq: Once | ORAL | Status: AC
  Administered 2017-09-03: 1000 mg via ORAL
  Filled 2017-09-03: qty 2

## 2017-09-03 MED ORDER — LACTATED RINGERS IV BOLUS (SEPSIS)
1000.0000 mL | Freq: Once | INTRAVENOUS | Status: AC
  Administered 2017-09-03: 1000 mL via INTRAVENOUS

## 2017-09-03 MED ORDER — CYCLOBENZAPRINE HCL 10 MG PO TABS
10.0000 mg | ORAL_TABLET | Freq: Once | ORAL | Status: AC
  Administered 2017-09-03: 10 mg via ORAL
  Filled 2017-09-03: qty 1

## 2017-09-03 NOTE — MAU Note (Addendum)
Vomitting and coughing x3 days. Unable to keep anything down.  Stronger BH ctx and some back pain, "feels like something is hanging out" (notices that when using bathroom)  Reports she is unsure about LOF- sometimes she notices some leaking.

## 2017-09-03 NOTE — MAU Provider Note (Addendum)
History     CSN: [id number]  Arrival date and time: 09/03/17 1514   First Provider Initiated Contact with Patient 09/03/17 1616      Chief Complaint  Patient presents with  . Back Pain   Natasha Smith is a 27 y.o. [phone] at 5735w4d who presents today with back pain. She also reports that she "feels something come out" when she tries to use the bathroom. She has a host of other complaints that include nausea/vomiting, cough, headache, occasional contractions, and overall "feeling terrible". She denies any VB or LOF. She reports normal fetal movement.    Back Pain  This is a new problem. The current episode started 1 to 4 weeks ago. The problem occurs constantly. The problem is unchanged. Pain location: lumbar on the left side.  The quality of the pain is described as shooting. Radiates to: the abdomen. The pain is at a severity of 10/10. Pertinent negatives include no dysuria or fever. Risk factors include pregnancy. Treatments tried: tylenol. The treatment provided no relief.   Past Medical History:  Diagnosis Date  . Abnormal Pap smear   . Headache   . Herpes simplex type II infection   . History of chlamydia infection   . Infection    UTI  . Pregnancy induced hypertension    hx  . Sickle cell trait [facility](H[facility])     Past Surgical History:  Procedure Laterality Date  . KNEE SURGERY      Family History  Problem Relation Age of Onset  . Diabetes Maternal Grandmother   . Hypertension Mother   . Asthma Mother   . Heart disease Maternal Grandfather   . Asthma Sister   . Asthma Brother   . Cancer Maternal Aunt     Social History   Tobacco Use  . Smoking status: Former Games developermoker  . Smokeless tobacco: Never Used  . Tobacco comment: March 2015  Substance Use Topics  . Alcohol use: No  . Drug use: No    Allergies: No Known Allergies  Medications Prior to Admission  Medication Sig Dispense Refill Last Dose  . acetaminophen (TYLENOL) 325 MG tablet Take 650 mg by mouth every 6  (six) hours as needed for moderate pain.   Past Week at Unknown time  . Prenatal Vit-Fe Fumarate-FA (PRENATAL VITAMIN) 27-0.8 MG TABS Take 1 tablet by mouth daily. (Patient not taking: Reported on 09/03/2017) 180 tablet 3 Not Taking at Unknown time  . valACYclovir (VALTREX) 1000 MG tablet Take 1 tablet (1,000 mg total) by mouth daily. (Patient not taking: Reported on 09/03/2017) 30 tablet 3 Not Taking at Unknown time    Review of Systems  Constitutional: Negative for chills and fever.  HENT: Positive for congestion.   Respiratory: Positive for cough.   Gastrointestinal: Positive for diarrhea, nausea and vomiting.  Genitourinary: Negative for dysuria, frequency, vaginal bleeding and vaginal discharge.  Musculoskeletal: Positive for back pain.   Physical Exam   Blood pressure 138/72, pulse 98, temperature 98.1 F (36.7 C), temperature source Oral, resp. rate 18, height 5\' 4"  (1.626 m), weight 187 lb 4 oz (84.9 kg), unknown if currently breastfeeding.  Physical Exam  Nursing note and vitals reviewed. Constitutional: She is oriented to person, place, and time. She appears well-developed and well-nourished. No distress.  HENT:  Head: Normocephalic.  Cardiovascular: Normal rate.  Respiratory: Effort normal.  GI: Soft. There is no tenderness. There is no rebound.  Genitourinary:  Genitourinary Comments:  External: no lesion, cystocele and rectocele noted  Vagina:  small amount of white discharge Cervix: pink, smooth, closed/thick  Uterus: AGA   Neurological: She is alert and oriented to person, place, and time.  Skin: Skin is warm and dry.  Psychiatric: She has a normal mood and affect.   FHT: 145, moderate with 10x10 accels, no decels Toco: no UCs   Results for orders placed or performed during the hospital encounter of 09/03/17 (from the past 24 hour(s))  Urinalysis, Routine w reflex microscopic     Status: Abnormal   Collection Time: 09/03/17  3:21 PM  Result Value Ref Range    Color, Urine YELLOW YELLOW   APPearance CLOUDY (A) CLEAR   Specific Gravity, Urine 1.016 1.005 - 1.030   pH 5.0 5.0 - 8.0   Glucose, UA 50 (A) NEGATIVE mg/dL   Hgb urine dipstick NEGATIVE NEGATIVE   Bilirubin Urine NEGATIVE NEGATIVE   Ketones, ur 20 (A) NEGATIVE mg/dL   Protein, ur 30 (A) NEGATIVE mg/dL   Nitrite NEGATIVE NEGATIVE   Leukocytes, UA MODERATE (A) NEGATIVE   RBC / HPF 6-30 0 - 5 RBC/hpf   WBC, UA 6-30 0 - 5 WBC/hpf   Bacteria, UA RARE (A) NONE SEEN   Squamous Epithelial / LPF TOO NUMEROUS TO COUNT (A) NONE SEEN   WBC Clumps PRESENT    Mucus PRESENT    Budding Yeast PRESENT   Protein / creatinine ratio, urine     Status: Abnormal   Collection Time: 09/03/17  3:21 PM  Result Value Ref Range   Creatinine, Urine 213.00 mg/dL   Total Protein, Urine 38 mg/dL   Protein Creatinine Ratio 0.18 (H) 0.00 - 0.15 mg/mg[Cre]  Wet prep, genital     Status: Abnormal   Collection Time: 09/03/17  4:25 PM  Result Value Ref Range   Yeast Wet Prep HPF POC NONE SEEN NONE SEEN   Trich, Wet Prep NONE SEEN NONE SEEN   Clue Cells Wet Prep HPF POC PRESENT (A) NONE SEEN   WBC, Wet Prep HPF POC MANY (A) NONE SEEN   Sperm NONE SEEN   CBC     Status: Abnormal   Collection Time: 09/03/17  6:05 PM  Result Value Ref Range   WBC 11.1 (H) 4.0 - 10.5 K/uL   RBC 4.60 3.87 - 5.11 MIL/uL   Hemoglobin 10.7 (L) 12.0 - 15.0 g/dL   HCT 30.8 (L) 65.7 - 84.6 %   MCV 69.6 (L) 78.0 - 100.0 fL   MCH 23.3 (L) 26.0 - 34.0 pg   MCHC 33.4 30.0 - 36.0 g/dL   RDW 96.2 (H) 95.2 - 84.1 %   Platelets 137 (L) 150 - 400 K/uL  Comprehensive metabolic panel     Status: Abnormal (Preliminary result)   Collection Time: 09/03/17  6:05 PM  Result Value Ref Range   Sodium 134 (L) 135 - 145 mmol/L   Potassium 3.3 (L) 3.5 - 5.1 mmol/L   Chloride 104 101 - 111 mmol/L   CO2 20 (L) 22 - 32 mmol/L   Glucose, Bld 78 65 - 99 mg/dL   BUN PENDING 6 - 20 mg/dL   Creatinine, Ser 3.24 0.44 - 1.00 mg/dL   Calcium 9.1 8.9 - 40.1  mg/dL   Total Protein 6.9 6.5 - 8.1 g/dL   Albumin 3.4 (L) 3.5 - 5.0 g/dL   AST 17 15 - 41 U/L   ALT 12 (L) 14 - 54 U/L   Alkaline Phosphatase 119 38 - 126 U/L   Total Bilirubin 0.4 0.3 -  1.2 mg/dL   GFR calc non Af Amer >60 >60 mL/min   GFR calc Af Amer >60 >60 mL/min   Anion gap 10 5 - 15    MAU Course  Procedures  MDM  Patient has had flexeril and phenergan. She reports that her nausea and back pain have improved.   1748: Patient was being DC by RN, and noted to have elevated blood pressure. Will cancel DC at this time, and check labs.   Patient has had 1L of fluids, zofran and tylenol. She is feeling better now.   1926: DW Dr. Emelda Fear, will plan to recheck platelets at 32 weeks to see if they are continuing to trend down or if this is a transient finding.   Assessment and Plan   1. Back pain affecting pregnancy in third trimester   2. Nausea and vomiting during pregnancy   3. [redacted] weeks gestation of pregnancy    DC home Comfort measures reviewed  3rd Trimester precautions  PTL precautions  Fetal kick counts RX: flexeril PRN #20, phenergan PRN #30  Return to MAU as needed FU with OB as planned  Follow-up Information    [facility] Follow up.   Specialty:  Family Medicine Contact information: [address] [id number] [address] [address] [phone]           Thressa Sheller 09/03/2017, 7:22 PM

## 2017-09-03 NOTE — Discharge Instructions (Signed)

## 2017-09-04 LAB — GC/CHLAMYDIA PROBE AMP (~~LOC~~) NOT AT ARMC
CHLAMYDIA, DNA PROBE: NEGATIVE
Neisseria Gonorrhea: NEGATIVE

## 2017-09-07 ENCOUNTER — Encounter: Payer: Medicaid Other | Admitting: Internal Medicine

## 2017-09-15 ENCOUNTER — Telehealth: Payer: Self-pay | Admitting: *Deleted

## 2017-09-15 ENCOUNTER — Ambulatory Visit (INDEPENDENT_AMBULATORY_CARE_PROVIDER_SITE_OTHER): Payer: Medicaid Other | Admitting: Internal Medicine

## 2017-09-15 ENCOUNTER — Other Ambulatory Visit: Payer: Self-pay

## 2017-09-15 ENCOUNTER — Encounter: Payer: Self-pay | Admitting: Internal Medicine

## 2017-09-15 VITALS — BP 118/76 | HR 92 | Temp 97.6°F | Wt 194.0 lb

## 2017-09-15 DIAGNOSIS — D696 Thrombocytopenia, unspecified: Secondary | ICD-10-CM

## 2017-09-15 NOTE — Telephone Encounter (Signed)
Contacted pt and informed her that I would be mailing the form she filled out this morning to her and confirmed the address we have on file and she requested it be sent to another address. [address], [place], [place] [address].  Placed in outgoing mail. Lamonte SakaiZimmerman Rumple, April D, [place]
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
## 2017-09-15 NOTE — Patient Instructions (Signed)
It was nice seeing you today Natasha Smith!  Please go to [facility] if you notice any of the following: - You feel a gush of fluid (as if your water broke) - You begin having painful contractions approximately every 3-5 minutes apart that make it difficult to breathe or walk - You are not feeling your baby move - You have vaginal bleeding  The address for [facility] is: [address]  [place], [place] [address] [phone]   I will see you back in 1 week for your lab appointment, and in 2 weeks for your next prenatal appointment.   If you have any questions or concerns in the meantime, please feel free to call the clinic.   Be well,  Dr. Natale MilchLancaster

## 2017-09-20 ENCOUNTER — Telehealth: Payer: Self-pay | Admitting: Internal Medicine

## 2017-09-20 ENCOUNTER — Ambulatory Visit (INDEPENDENT_AMBULATORY_CARE_PROVIDER_SITE_OTHER): Payer: Medicaid Other | Admitting: Internal Medicine

## 2017-09-20 ENCOUNTER — Other Ambulatory Visit (HOSPITAL_COMMUNITY)
Admission: RE | Admit: 2017-09-20 | Discharge: 2017-09-20 | Disposition: A | Discharge status: Home / Self Care | Payer: Medicaid Other | Source: Ambulatory Visit | Attending: Family Medicine | Admitting: Family Medicine

## 2017-09-20 ENCOUNTER — Other Ambulatory Visit: Payer: Medicaid Other

## 2017-09-20 ENCOUNTER — Encounter: Payer: Self-pay | Admitting: Internal Medicine

## 2017-09-20 VITALS — BP 120/78 | HR 88 | Temp 97.7°F | Resp 16 | Ht 64.0 in | Wt 194.2 lb

## 2017-09-20 DIAGNOSIS — O26893 Other specified pregnancy related conditions, third trimester: Secondary | ICD-10-CM | POA: Insufficient documentation

## 2017-09-20 DIAGNOSIS — O99113 Other diseases of the blood and blood-forming organs and certain disorders involving the immune mechanism complicating pregnancy, third trimester: Secondary | ICD-10-CM

## 2017-09-20 DIAGNOSIS — N898 Other specified noninflammatory disorders of vagina: Secondary | ICD-10-CM | POA: Diagnosis not present

## 2017-09-20 DIAGNOSIS — D696 Thrombocytopenia, unspecified: Secondary | ICD-10-CM

## 2017-09-20 LAB — POCT 1 HR PRENATAL GLUCOSE: Glucose 1 Hr Prenatal, POC: 102 mg/dL

## 2017-09-20 LAB — POCT WET PREP (WET MOUNT)
Clue Cells Wet Prep Whiff POC: NEGATIVE
TRICHOMONAS WET PREP HPF POC: ABSENT
WBC, Wet Prep HPF POC: >20

## 2017-09-20 NOTE — Telephone Encounter (Signed)
Patient with appt scheduled at 11:30 today, however is supposed to have 1hr GTT, and lab will be closed for staff meeting at 12:30. Called patient to ask her to come at 10:30 to begin GTT. No answer, so left message explaining this. Will call again later this morning to try to speak with her directly to ensure she understands.   Natasha AbernethyAbigail J Delita Chiquito, MD, MPH PGY-3 [facility] Pager [phone]

## 2017-09-20 NOTE — Assessment & Plan Note (Signed)
CBC checked today. Will f/u on results and contact patient when available. If still elevated, will work-up further.

## 2017-09-20 NOTE — Progress Notes (Signed)
   Subjective:   Patient: Natasha Smith       Birthdate: 05-Mar-1991       MRN: [id number]      HPI  Natasha Smith is a 27 y.o. female [phone] at 6875w0d presenting for complaint of "feels like something is coming out."   "Feels like something is coming out" Patient initially seen for this issue in MAU on 09/03/17. Reported a feeling of "something coming out" when using the bathroom, as well as other complaints. Pelvic exam showed small amount of white discharge but no other abnormalities, including no lesions, cystoceles, or rectoceles. She was treated for her back pain and other complaints, and told to f/u with PCP for regular prenatal care.  Today, patient says this feeling has completely resolved. She endorses a white milky vaginal discharge, but thinks this is normal for her. Occasionally has vaginal itching but says this is not significant. Denies dysuria, hematuria. She denies vaginal bleeding, contractions, abd pain. Pos fetal movement.   Elevated BP, resolved Patient also noted to have elevated BP in MAU on that date, so labs checked. Was noted to have thrombocytopenia, with recommended platelet recheck at 32 weeks. BP normal at 31w prenatal visit.   Mood Difficult social situation. Counseling set up with pregnancy counselor at [facility] earlier in pregnancy, however patient's situation began to improve so she did not attend counseling. Was also prescribed Zoloft, however patient never began. Started to journal which she said helped with her mood. At recent appointments, mood has remained significantly improved. Today, says mood is good. Has some sad moments but generally is happy. Still living with her mother and feels safe there. Still journaling some from time to time.   Of note, patient only taking prenatal vitamins intermittently, as she says they make her feel nauseated. She has gummy PNVs. Says she has difficulty swallowing pills so has not tried regular PNV tablets.   Smoking  status reviewed. Patient is former smoker.   Review of Systems See HPI.     Objective:  Physical Exam  Constitutional: She is oriented to person, place, and time and well-developed, well-nourished, and in no distress.  HENT:  Head: Normocephalic and atraumatic.  Pulmonary/Chest: Effort normal. No respiratory distress.  Abdominal: Soft. There is no tenderness.  Genitourinary:  Genitourinary Comments: No abnormalities on cervical exam, including no rectocele, cystocele, even with Valsalva maneuver. Moderate amount of white mucinous discharge present. No adnexal or suprapubic tenderness on exam.   Neurological: She is alert and oriented to person, place, and time.  Skin: Skin is warm.  Psychiatric: Affect and judgment normal.   Fundal heigh: 31cm FHR: 150 BPM     Assessment & Plan:  Gestational thrombocytopenia (HCC) CBC checked today. Will f/u on results and contact patient when available. If still elevated, will work-up further.   Elevated BP, resolved BP now normal at past two visits. No further work-up indicated. Will continue to monitor.   "Feeling something falling out", resolved Feeling now completely resolved. No abnormalities on pelvic exam today, even with Valsalva maneuver. Encouraged patient to call if feeling resumes.   Vaginal discharge Possibly physciologic, but wet prep and GC/chlamydia obtained today. Patient did not wish to await results and requested to be called if any abnormalities.   1hr glucola obtained today. Results normal (CBG 102).   F/u in 2 weeks for next prenatal appt.   Precepted with Dr. Pollie MeyerMcIntyre.   Tarri AbernethyAbigail J Meiah Zamudio, MD, MPH PGY-3 [facility] Pager [phone]

## 2017-09-20 NOTE — Telephone Encounter (Signed)
Called again to discuss patient possibly coming early for 1 hr glucola. No answer. Will call again later.   Tarri AbernethyAbigail J Penda Venturi, MD, MPH PGY-3 [facility] Pager [phone]

## 2017-09-20 NOTE — Patient Instructions (Addendum)
It was nice seeing you today Natasha Smith!  If the feeling of something hanging out returns, please call to let us know. It is very important to take your prenatal vitamins every single day. Try taking them with a small snack to see if this makes it easier to keep them down.   I will call you when the results of your testing today are back, probably within the next couple days.   Please go to [facility] if you notice any of the following: - You feel a gush of fluid (as if your water broke) - You begin having painful contractions approximately every 3-5 minutes apart that make it difficult to breathe or walk - You are not feeling your baby move - You have vaginal bleeding  The address for [facility] is: [address]  [place], [place] [address] [phone]   I will see you back in 2 weeks for your next prenatal appointment.   If you have any questions or concerns in the meantime, please feel free to call the clinic.   Be well,  Dr. Natale MilchLancaster

## 2017-09-21 ENCOUNTER — Telehealth: Payer: Self-pay | Admitting: Internal Medicine

## 2017-09-21 LAB — CBC
HEMATOCRIT: 32.1 % — AB (ref 34.0–46.6)
Hemoglobin: 9.9 g/dL — ABNORMAL LOW (ref 11.1–15.9)
MCH: 22.4 pg — AB (ref 26.6–33.0)
MCHC: 30.8 g/dL — ABNORMAL LOW (ref 31.5–35.7)
MCV: 73 fL — AB (ref 79–97)
Platelets: 185 10*3/uL (ref 150–379)
RBC: 4.41 x10E6/uL (ref 3.77–5.28)
RDW: 16.6 % — ABNORMAL HIGH (ref 12.3–15.4)
WBC: 10.6 10*3/uL (ref 3.4–10.8)

## 2017-09-21 NOTE — Telephone Encounter (Signed)
Platelets have normalized. Hgb slightly lower than prior with decreased MCV, likely anemia of pregnancy with component of iron-deficiency of anemia. Patient does have history of microcytic anemia, so this is not a new issue, and Hgb has been lower than current in the past. Discussed importance of taking daily prenatal vitamins with patient at her appointment yesterday, including importance of getting enough iron. Patient has not been taking regularly. Will continue to focus on importance of PNV and continue to monitor Hgb and MCV.  Tarri AbernethyAbigail J Milano Rosevear, MD, MPH PGY-3 [facility] Pager [phone]

## 2017-09-22 ENCOUNTER — Other Ambulatory Visit: Payer: Medicaid Other

## 2017-09-22 LAB — CERVICOVAGINAL ANCILLARY ONLY
Chlamydia: NEGATIVE
Neisseria Gonorrhea: NEGATIVE

## 2017-09-29 ENCOUNTER — Encounter: Payer: Medicaid Other | Admitting: Internal Medicine

## 2017-10-13 ENCOUNTER — Inpatient Hospital Stay (HOSPITAL_COMMUNITY)
Admission: AD | Admit: 2017-10-13 | Discharge: 2017-10-13 | Disposition: A | Discharge status: Home / Self Care | Payer: Medicaid Other | Source: Ambulatory Visit | Attending: Obstetrics and Gynecology | Admitting: Obstetrics and Gynecology

## 2017-10-13 ENCOUNTER — Other Ambulatory Visit (HOSPITAL_COMMUNITY)
Admission: RE | Admit: 2017-10-13 | Discharge: 2017-10-13 | Disposition: A | Discharge status: Home / Self Care | Payer: Medicaid Other | Source: Ambulatory Visit | Attending: Family Medicine | Admitting: Family Medicine

## 2017-10-13 ENCOUNTER — Ambulatory Visit (INDEPENDENT_AMBULATORY_CARE_PROVIDER_SITE_OTHER): Payer: Medicaid Other | Admitting: Internal Medicine

## 2017-10-13 VITALS — BP 124/80 | HR 90 | Temp 97.5°F | Wt 188.4 lb

## 2017-10-13 DIAGNOSIS — O133 Gestational [pregnancy-induced] hypertension without significant proteinuria, third trimester: Secondary | ICD-10-CM | POA: Diagnosis not present

## 2017-10-13 DIAGNOSIS — Z87891 Personal history of nicotine dependence: Secondary | ICD-10-CM | POA: Diagnosis not present

## 2017-10-13 DIAGNOSIS — Z3493 Encounter for supervision of normal pregnancy, unspecified, third trimester: Secondary | ICD-10-CM

## 2017-10-13 DIAGNOSIS — R030 Elevated blood-pressure reading, without diagnosis of hypertension: Secondary | ICD-10-CM

## 2017-10-13 DIAGNOSIS — Z0371 Encounter for suspected problem with amniotic cavity and membrane ruled out: Secondary | ICD-10-CM

## 2017-10-13 DIAGNOSIS — I10 Essential (primary) hypertension: Secondary | ICD-10-CM | POA: Insufficient documentation

## 2017-10-13 DIAGNOSIS — Z23 Encounter for immunization: Secondary | ICD-10-CM | POA: Diagnosis not present

## 2017-10-13 DIAGNOSIS — Z3A35 35 weeks gestation of pregnancy: Secondary | ICD-10-CM | POA: Insufficient documentation

## 2017-10-13 DIAGNOSIS — O26893 Other specified pregnancy related conditions, third trimester: Secondary | ICD-10-CM | POA: Diagnosis present

## 2017-10-13 HISTORY — DX: Elevated blood-pressure reading, without diagnosis of hypertension: R03.0

## 2017-10-13 LAB — CBC
HCT: 30.3 % — ABNORMAL LOW (ref 36.0–46.0)
HEMOGLOBIN: 10 g/dL — AB (ref 12.0–15.0)
MCH: 22.9 pg — AB (ref 26.0–34.0)
MCHC: 33 g/dL (ref 30.0–36.0)
MCV: 69.3 fL — ABNORMAL LOW (ref 78.0–100.0)
PLATELETS: 149 10*3/uL — AB (ref 150–400)
RBC: 4.37 MIL/uL (ref 3.87–5.11)
RDW: 17.3 % — ABNORMAL HIGH (ref 11.5–15.5)
WBC: 14.1 10*3/uL — AB (ref 4.0–10.5)

## 2017-10-13 LAB — PROTEIN / CREATININE RATIO, URINE
Creatinine, Urine: 199 mg/dL
Protein Creatinine Ratio: 0.08 mg/mg{Cre} (ref 0.00–0.15)
Total Protein, Urine: 15 mg/dL

## 2017-10-13 LAB — POCT FERN TEST: POCT Fern Test: NEGATIVE

## 2017-10-13 LAB — COMPREHENSIVE METABOLIC PANEL
ALK PHOS: 145 U/L — AB (ref 38–126)
ALT: 8 U/L — AB (ref 14–54)
AST: 13 U/L — AB (ref 15–41)
Albumin: 3.2 g/dL — ABNORMAL LOW (ref 3.5–5.0)
Anion gap: 10 (ref 5–15)
CALCIUM: 8.7 mg/dL — AB (ref 8.9–10.3)
CHLORIDE: 105 mmol/L (ref 101–111)
CO2: 18 mmol/L — AB (ref 22–32)
CREATININE: 0.57 mg/dL (ref 0.44–1.00)
GFR calc Af Amer: >60 mL/min (ref >60)
GFR calc non Af Amer: >60 mL/min (ref >60)
GLUCOSE: 78 mg/dL (ref 65–99)
Potassium: 3.1 mmol/L — ABNORMAL LOW (ref 3.5–5.1)
SODIUM: 133 mmol/L — AB (ref 135–145)
Total Bilirubin: 1.2 mg/dL (ref 0.3–1.2)
Total Protein: 6.9 g/dL (ref 6.5–8.1)

## 2017-10-13 LAB — AMNISURE RUPTURE OF MEMBRANE (ROM) NOT AT ARMC: Amnisure ROM: NEGATIVE
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
## 2017-10-13 NOTE — Patient Instructions (Addendum)
It was nice seeing you today Natasha Smith!  Please go to [facility] right away.  The address for [facility] is: [address]  [place], [place] [address] [phone]   If you have not delivered by then, I will see you back in 2 weeks for your next prenatal appointment.   If you have any questions or concerns in the meantime, please feel free to call the clinic.   Be well,  Dr. Natale MilchLancaster

## 2017-10-13 NOTE — Discharge Instructions (Signed)

## 2017-10-13 NOTE — MAU Note (Signed)
Gush of fluid at ob appointment, thinks water broke.

## 2017-10-13 NOTE — MAU Provider Note (Signed)
History     CSN: [id number]  Arrival date and time: 10/13/17 1631   First Provider Initiated Contact with Patient 10/13/17 1707      Chief Complaint  Patient presents with  . Rupture of Membranes    Gush of fluid at routine OB appointment    Natasha Smith is a 27 y.o. [phone] at [redacted]w[redacted]d who presents today from the office for evaluation for ROM. She states that she has been leaking off and on today. She denies any VB or LOF. She reports normal fetal movement. She denies HA, visual disturbances or RUQ pain. She reports a history of pre-eclampsia with prior pregnancies.   Vaginal Discharge  The patient's primary symptoms include vaginal discharge. The patient's pertinent negatives include no pelvic pain. This is a new problem. The current episode started today. The problem occurs intermittently. The problem has been unchanged. The patient is experiencing no pain. She is pregnant. Pertinent negatives include no abdominal pain, chills, fever, headaches, nausea or vomiting. The vaginal discharge was watery. There has been no bleeding. Nothing aggravates the symptoms. She has tried nothing for the symptoms.   Past Medical History:  Diagnosis Date  . Abnormal Pap smear   . Headache   . Herpes simplex type II infection   . History of chlamydia infection   . Infection    UTI  . Pregnancy induced hypertension    hx  . Sickle cell trait [facility])     Past Surgical History:  Procedure Laterality Date  . KNEE SURGERY      Family History  Problem Relation Age of Onset  . Diabetes Maternal Grandmother   . Hypertension Mother   . Asthma Mother   . Heart disease Maternal Grandfather   . Asthma Sister   . Asthma Brother   . Cancer Maternal Aunt     Social History   Tobacco Use  . Smoking status: Former Games developer  . Smokeless tobacco: Never Used  . Tobacco comment: March 2015  Substance Use Topics  . Alcohol use: No  . Drug use: No    Allergies: No Known Allergies  Medications Prior to  Admission  Medication Sig Dispense Refill Last Dose  . acetaminophen (TYLENOL) 325 MG tablet Take 650 mg by mouth every 6 (six) hours as needed for moderate pain.   10/06/2017  . cyclobenzaprine (FLEXERIL) 10 MG tablet Take 1 tablet (10 mg total) by mouth 2 (two) times daily as needed for muscle spasms. (Patient not taking: Reported on 09/20/2017) 20 tablet 0 Not Taking  . Prenatal Vit-Fe Fumarate-FA (PRENATAL VITAMIN) 27-0.8 MG TABS Take 1 tablet by mouth daily. 180 tablet 3 10/10/2017  . promethazine (PHENERGAN) 25 MG tablet Take 0.5-1 tablets (12.5-25 mg total) by mouth every 6 (six) hours as needed. (Patient not taking: Reported on 09/20/2017) 30 tablet 0 Not Taking  . valACYclovir (VALTREX) 1000 MG tablet Take 1 tablet (1,000 mg total) by mouth daily. (Patient not taking: Reported on 09/03/2017) 30 tablet 3 Not Taking    Review of Systems  Constitutional: Negative for chills and fever.  Eyes: Negative for visual disturbance.  Gastrointestinal: Negative for abdominal pain, nausea and vomiting.  Genitourinary: Positive for vaginal discharge. Negative for pelvic pain.  Neurological: Negative for headaches.   Physical Exam   Blood pressure 122/70, pulse 91, temperature 98.8 F (37.1 C), temperature source Oral, resp. rate 16, SpO2 100 %, unknown if currently breastfeeding.  Physical Exam  Nursing note and vitals reviewed. Constitutional: She is oriented to person,  place, and time. She appears well-developed and well-nourished. No distress.  HENT:  Head: Normocephalic.  Cardiovascular: Normal rate.  Respiratory: Effort normal.  GI: Soft. There is no tenderness. There is no rebound.  Genitourinary:  Genitourinary Comments:  External: no lesion Vagina: small amount of white discharge. No pooling  Cervix: pink, smooth, no fluid with valsalva  Uterus: AGA  Neurological: She is alert and oriented to person, place, and time.  Skin: Skin is warm and dry.  Psychiatric: She has a normal mood  and affect.    Results for orders placed or performed during the hospital encounter of 10/13/17 (from the past 24 hour(s))  Fern Test     Status: Normal   Collection Time: 10/13/17  5:20 PM  Result Value Ref Range   POCT Fern Test Negative = intact amniotic membranes   CBC     Status: Abnormal   Collection Time: 10/13/17  5:29 PM  Result Value Ref Range   WBC 14.1 (H) 4.0 - 10.5 K/uL   RBC 4.37 3.87 - 5.11 MIL/uL   Hemoglobin 10.0 (L) 12.0 - 15.0 g/dL   HCT 13.230.3 (L) 44.036.0 - 10.246.0 %   MCV 69.3 (L) 78.0 - 100.0 fL   MCH 22.9 (L) 26.0 - 34.0 pg   MCHC 33.0 30.0 - 36.0 g/dL   RDW 72.517.3 (H) 36.611.5 - 44.015.5 %   Platelets 149 (L) 150 - 400 K/uL  Comprehensive metabolic panel     Status: Abnormal   Collection Time: 10/13/17  5:29 PM  Result Value Ref Range   Sodium 133 (L) 135 - 145 mmol/L   Potassium 3.1 (L) 3.5 - 5.1 mmol/L   Chloride 105 101 - 111 mmol/L   CO2 18 (L) 22 - 32 mmol/L   Glucose, Bld 78 65 - 99 mg/dL   BUN <5 (L) 6 - 20 mg/dL   Creatinine, Ser 3.470.57 0.44 - 1.00 mg/dL   Calcium 8.7 (L) 8.9 - 10.3 mg/dL   Total Protein 6.9 6.5 - 8.1 g/dL   Albumin 3.2 (L) 3.5 - 5.0 g/dL   AST 13 (L) 15 - 41 U/L   ALT 8 (L) 14 - 54 U/L   Alkaline Phosphatase 145 (H) 38 - 126 U/L   Total Bilirubin 1.2 0.3 - 1.2 mg/dL   GFR calc non Af Amer >60 >60 mL/min   GFR calc Af Amer >60 >60 mL/min   Anion gap 10 5 - 15  Amnisure rupture of membrane (rom)not at [facility]     Status: None   Collection Time: 10/13/17  5:36 PM  Result Value Ref Range   Amnisure ROM NEGATIVE   Protein / creatinine ratio, urine     Status: None   Collection Time: 10/13/17  6:22 PM  Result Value Ref Range   Creatinine, Urine 199.00 mg/dL   Total Protein, Urine 15 mg/dL   Protein Creatinine Ratio 0.08 0.00 - 0.15 mg/mg[Cre]    FHT: 135, moderate with 15x15 accels, no decels Toco: no UCs  MAU Course  Procedures  MDM 1954: DW Dr. Despina HiddenEure, ok for DC home. Does meet criteria for GHTN, will send message to FM to start  antenatal testing, and plan IOL at 37 weeks.   Assessment and Plan   1. Gestational hypertension, third trimester   2. Encounter for suspected premature rupture of membranes, with rupture of membranes not found   3. [redacted] weeks gestation of pregnancy    DC home Comfort measures reviewed  3rd Trimester precautions  PTL precautions  Fetal kick counts RX: none  Return to MAU as needed FU with OB as planned  Follow-up Information    [facility] Follow up.   Specialty:  Family Medicine Contact information: [address] [id number] [place] [address] [phone]           Thressa Sheller 10/13/2017, 7:53 PM

## 2017-10-16 ENCOUNTER — Telehealth: Payer: Self-pay | Admitting: Internal Medicine

## 2017-10-16 LAB — CERVICOVAGINAL ANCILLARY ONLY
CHLAMYDIA, DNA PROBE: NEGATIVE
Neisseria Gonorrhea: NEGATIVE

## 2017-10-16 NOTE — Telephone Encounter (Signed)
Called patient to see how she is doing after her MAU visit. Membranes had not ruptured as we suspected in clinic, and contractions have not become more severe. Patient says she is doing well this morning. From chart review, platelets low at MAU, so will recheck at next appt. Patient did not have f/u appt scheduled, so scheduled one for her next Thursday (April 18) at 3:30 PM (first appt patient said she will be able to attend). Will recheck platelets then.   Tarri AbernethyAbigail J Lancaster, MD, MPH PGY-3 [facility] Pager [phone]

## 2017-10-17 LAB — CULTURE, BETA STREP (GROUP B ONLY): STREP GP B CULTURE: NEGATIVE

## 2017-10-22 ENCOUNTER — Encounter (HOSPITAL_COMMUNITY): Payer: Self-pay | Admitting: *Deleted

## 2017-10-22 ENCOUNTER — Inpatient Hospital Stay (HOSPITAL_COMMUNITY)
Admission: AD | Admit: 2017-10-22 | Discharge: 2017-10-22 | Disposition: A | Discharge status: Home / Self Care | Payer: Medicaid Other | Source: Ambulatory Visit | Attending: Obstetrics & Gynecology | Admitting: Obstetrics & Gynecology

## 2017-10-22 DIAGNOSIS — Z3483 Encounter for supervision of other normal pregnancy, third trimester: Secondary | ICD-10-CM | POA: Diagnosis present

## 2017-10-22 DIAGNOSIS — O479 False labor, unspecified: Secondary | ICD-10-CM

## 2017-10-22 DIAGNOSIS — Z3A37 37 weeks gestation of pregnancy: Secondary | ICD-10-CM | POA: Diagnosis not present

## 2017-10-22 DIAGNOSIS — D696 Thrombocytopenia, unspecified: Secondary | ICD-10-CM

## 2017-10-22 DIAGNOSIS — O99113 Other diseases of the blood and blood-forming organs and certain disorders involving the immune mechanism complicating pregnancy, third trimester: Secondary | ICD-10-CM

## 2017-10-22 LAB — URINALYSIS, ROUTINE W REFLEX MICROSCOPIC
Bilirubin Urine: NEGATIVE
GLUCOSE, UA: NEGATIVE mg/dL
Hgb urine dipstick: NEGATIVE
Ketones, ur: NEGATIVE mg/dL
Nitrite: NEGATIVE
PROTEIN: NEGATIVE mg/dL
Specific Gravity, Urine: 1.009 (ref 1.005–1.030)
pH: 6 (ref 5.0–8.0)

## 2017-10-22 NOTE — Progress Notes (Signed)
[facility]ullen [facility] Aware of pt's admission and status. Aware of sve, ctx pattern, b/ps, reassuring FHR but not reactive yet. May d/c home once FHR strip reactive.

## 2017-10-22 NOTE — Progress Notes (Addendum)
Written and verbal d/c instructions given and understanding voiced. Discussed getting pregnancy belt to help with abd and pelvic pressure. Reminded pt to stay well hydrated and not to check her cervix anymore

## 2017-10-22 NOTE — Progress Notes (Signed)
Mardella Layman RN answered call. MD in delivery. Will give message for MD to call MAU regarding labor check

## 2017-10-22 NOTE — Discharge Instructions (Signed)
Braxton Hicks Contractions °Contractions of the uterus can occur throughout pregnancy, but they are not always a sign that you are in labor. You may have practice contractions called Braxton Hicks contractions. These false labor contractions are sometimes confused with true labor. °What are Braxton Hicks contractions? °Braxton Hicks contractions are tightening movements that occur in the muscles of the uterus before labor. Unlike true labor contractions, these contractions do not result in opening (dilation) and thinning of the cervix. Toward the end of pregnancy (32-34 weeks), Braxton Hicks contractions can happen more often and may become stronger. These contractions are sometimes difficult to tell apart from true labor because they can be very uncomfortable. You should not feel embarrassed if you go to the hospital with false labor. °Sometimes, the only way to tell if you are in true labor is for your health care provider to look for changes in the cervix. The health care provider will do a physical exam and may monitor your contractions. If you are not in true labor, the exam should show that your cervix is not dilating and your water has not broken. °If there are other health problems associated with your pregnancy, it is completely safe for you to be sent home with false labor. You may continue to have Braxton Hicks contractions until you go into true labor. °How to tell the difference between true labor and false labor °True labor °· Contractions last 30-70 seconds. °· Contractions become very regular. °· Discomfort is usually felt in the top of the uterus, and it spreads to the lower abdomen and low back. °· Contractions do not go away with walking. °· Contractions usually become more intense and increase in frequency. °· The cervix dilates and gets thinner. °False labor °· Contractions are usually shorter and not as strong as true labor contractions. °· Contractions are usually irregular. °· Contractions  are often felt in the front of the lower abdomen and in the groin. °· Contractions may go away when you walk around or change positions while lying down. °· Contractions get weaker and are shorter-lasting as time goes on. °· The cervix usually does not dilate or become thin. °Follow these instructions at home: °· Take over-the-counter and prescription medicines only as told by your health care provider. °· Keep up with your usual exercises and follow other instructions from your health care provider. °· Eat and drink lightly if you think you are going into labor. °· If Braxton Hicks contractions are making you uncomfortable: °? Change your position from lying down or resting to walking, or change from walking to resting. °? Sit and rest in a tub of warm water. °? Drink enough fluid to keep your urine pale yellow. Dehydration may cause these contractions. °? Do slow and deep breathing several times an hour. °· Keep all follow-up prenatal visits as told by your health care provider. This is important. °Contact a health care provider if: °· You have a fever. °· You have continuous pain in your abdomen. °Get help right away if: °· Your contractions become stronger, more regular, and closer together. °· You have fluid leaking or gushing from your vagina. °· You pass blood-tinged mucus (bloody show). °· You have bleeding from your vagina. °· You have low back pain that you never had before. °· You feel your baby’s head pushing down and causing pelvic pressure. °· Your baby is not moving inside you as much as it used to. °Summary °· Contractions that occur before labor are called Braxton   Hicks contractions, false labor, or practice contractions. °· Braxton Hicks contractions are usually shorter, weaker, farther apart, and less regular than true labor contractions. True labor contractions usually become progressively stronger and regular and they become more frequent. °· Manage discomfort from Braxton Hicks contractions by  changing position, resting in a warm bath, drinking plenty of water, or practicing deep breathing. °This information is not intended to replace advice given to you by your health care provider. Make sure you discuss any questions you have with your health care provider. °Document Released: 11/10/2016 Document Revised: 11/10/2016 Document Reviewed: 11/10/2016 °Elsevier Interactive Patient Education © 2018 [employer] Inc[employer] ° °

## 2017-10-22 NOTE — MAU Note (Addendum)
Having a lot of ctxs off and on. Some lower abd pain, lower back, pelvic pain. Lost mucous plug 3 days ago. Denies bleeding and no LOF, just mucous d/c. Pt states she checked herself and 3-4cm

## 2017-10-23 ENCOUNTER — Telehealth: Payer: Self-pay | Admitting: Internal Medicine

## 2017-10-23 ENCOUNTER — Telehealth: Payer: Self-pay | Admitting: Family Medicine

## 2017-10-23 NOTE — Telephone Encounter (Signed)
I spoke with [name] by phone. [id number] at 36 5/7 (+) vagianl bleed today.  No prior bleeding this pregnancy. (+) irregular contractions. Last OB ultrasound 03/20/17. Painless bleeding into commode turned it red color.  HPI: Recent MAU emergency visits for vomiting (10/13/17) without rupture of membranes: negative GC/Chlamydia, U P:C 0.08, Amnisure negative, Ferning negative;  and for False Labor  (10/22/17).  Report of losing mucus plug recently.  A/ Third Trimest vaginal Bleed: Uncertain amount of bleed. Broad differential placenta previa (painless), placental abruption (painful), and idiopathic. P/ Referral to MAU for evaluation and management

## 2017-10-23 NOTE — Telephone Encounter (Signed)
Returned patient's call. No answer but left message. Encouraged patient to go to MAU or call clinic and ask to speak with preceptor if it is an urgent issue, as I am not in clinic today. Will try to call again later.   Tarri AbernethyAbigail J Donnesha Karg, MD, MPH PGY-3 [facility] Pager [phone]

## 2017-10-23 NOTE — Telephone Encounter (Signed)
I spoke with [name] by phone about her new complaint of vaginal bleeding The bleed was enough to change the color of commode water to red.  Patient is 39 weeks.  Her contractions are currently irregular.

## 2017-10-23 NOTE — Telephone Encounter (Signed)
Pt called and said that she thinks she is having some issues with her pregnancy and would like to discuss them with Dr Natale MilchLancaster. Please call her at [phone]

## 2017-10-23 NOTE — Telephone Encounter (Signed)
Pt called back and said she would like Dr Natale MilchLancaster to call her on a different phone number.

## 2017-10-26 ENCOUNTER — Ambulatory Visit (INDEPENDENT_AMBULATORY_CARE_PROVIDER_SITE_OTHER): Payer: Medicaid Other | Admitting: Internal Medicine

## 2017-10-26 VITALS — BP 120/80 | HR 84 | Temp 97.5°F | Wt 189.8 lb

## 2017-10-26 DIAGNOSIS — Z3493 Encounter for supervision of normal pregnancy, unspecified, third trimester: Secondary | ICD-10-CM

## 2017-10-26 LAB — POCT FERNING: FERNING, POC: NEGATIVE

## 2017-10-26 NOTE — Patient Instructions (Signed)
It was nice seeing you today Natasha Smith!  Please go to the MAU now to assess if your water has broken.   The address for [facility] is: [address]  [place], [place] [address] [phone]   I will see you back in 1 week for your next prenatal appointment if you have not delivered by then.   If you have any questions or concerns in the meantime, please feel free to call the clinic.   Be well,  Dr. Natale MilchLancaster
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
## 2017-10-26 NOTE — Progress Notes (Signed)
la 

## 2017-10-28 ENCOUNTER — Inpatient Hospital Stay (HOSPITAL_COMMUNITY): Payer: Medicaid Other | Admitting: Anesthesiology

## 2017-10-28 ENCOUNTER — Inpatient Hospital Stay (HOSPITAL_COMMUNITY)
Admission: AD | Admit: 2017-10-28 | Discharge: 2017-10-30 | DRG: 797 | Disposition: A | Discharge status: Home / Self Care | Payer: Medicaid Other | Source: Ambulatory Visit | Attending: Family Medicine | Admitting: Family Medicine

## 2017-10-28 ENCOUNTER — Encounter (HOSPITAL_COMMUNITY)
Admission: AD | Disposition: A | Discharge status: Home / Self Care | Payer: Self-pay | Source: Ambulatory Visit | Attending: Family Medicine

## 2017-10-28 ENCOUNTER — Encounter (HOSPITAL_COMMUNITY): Payer: Self-pay | Admitting: *Deleted

## 2017-10-28 DIAGNOSIS — Z3483 Encounter for supervision of other normal pregnancy, third trimester: Secondary | ICD-10-CM | POA: Diagnosis present

## 2017-10-28 DIAGNOSIS — D573 Sickle-cell trait: Secondary | ICD-10-CM | POA: Diagnosis present

## 2017-10-28 DIAGNOSIS — O134 Gestational [pregnancy-induced] hypertension without significant proteinuria, complicating childbirth: Principal | ICD-10-CM | POA: Diagnosis present

## 2017-10-28 DIAGNOSIS — E669 Obesity, unspecified: Secondary | ICD-10-CM | POA: Diagnosis present

## 2017-10-28 DIAGNOSIS — Z3A37 37 weeks gestation of pregnancy: Secondary | ICD-10-CM | POA: Diagnosis not present

## 2017-10-28 DIAGNOSIS — O139 Gestational [pregnancy-induced] hypertension without significant proteinuria, unspecified trimester: Secondary | ICD-10-CM | POA: Diagnosis not present

## 2017-10-28 DIAGNOSIS — D6959 Other secondary thrombocytopenia: Secondary | ICD-10-CM | POA: Diagnosis present

## 2017-10-28 DIAGNOSIS — I10 Essential (primary) hypertension: Secondary | ICD-10-CM | POA: Diagnosis present

## 2017-10-28 DIAGNOSIS — O99214 Obesity complicating childbirth: Secondary | ICD-10-CM | POA: Diagnosis present

## 2017-10-28 DIAGNOSIS — R030 Elevated blood-pressure reading, without diagnosis of hypertension: Secondary | ICD-10-CM | POA: Diagnosis present

## 2017-10-28 DIAGNOSIS — Z302 Encounter for sterilization: Secondary | ICD-10-CM

## 2017-10-28 DIAGNOSIS — O9912 Other diseases of the blood and blood-forming organs and certain disorders involving the immune mechanism complicating childbirth: Secondary | ICD-10-CM | POA: Diagnosis present

## 2017-10-28 DIAGNOSIS — O9902 Anemia complicating childbirth: Secondary | ICD-10-CM | POA: Diagnosis present

## 2017-10-28 DIAGNOSIS — O99119 Other diseases of the blood and blood-forming organs and certain disorders involving the immune mechanism complicating pregnancy, unspecified trimester: Secondary | ICD-10-CM

## 2017-10-28 DIAGNOSIS — Z87891 Personal history of nicotine dependence: Secondary | ICD-10-CM | POA: Diagnosis not present

## 2017-10-28 DIAGNOSIS — D696 Thrombocytopenia, unspecified: Secondary | ICD-10-CM | POA: Diagnosis present

## 2017-10-28 DIAGNOSIS — B009 Herpesviral infection, unspecified: Secondary | ICD-10-CM | POA: Diagnosis present

## 2017-10-28 DIAGNOSIS — O99113 Other diseases of the blood and blood-forming organs and certain disorders involving the immune mechanism complicating pregnancy, third trimester: Secondary | ICD-10-CM

## 2017-10-28 HISTORY — PX: TUBAL LIGATION: SHX77

## 2017-10-28 LAB — CBC
HCT: 31 % — ABNORMAL LOW (ref 36.0–46.0)
HEMATOCRIT: 29.4 % — AB (ref 36.0–46.0)
HEMOGLOBIN: 9.8 g/dL — AB (ref 12.0–15.0)
Hemoglobin: 10 g/dL — ABNORMAL LOW (ref 12.0–15.0)
MCH: 23.3 pg — AB (ref 26.0–34.0)
MCH: 23 pg — ABNORMAL LOW (ref 26.0–34.0)
MCHC: 33.3 g/dL (ref 30.0–36.0)
MCHC: 32.3 g/dL (ref 30.0–36.0)
MCV: 70 fL — AB (ref 78.0–100.0)
MCV: 71.4 fL — AB (ref 78.0–100.0)
PLATELETS: 187 10*3/uL (ref 150–400)
Platelets: 174 10*3/uL (ref 150–400)
RBC: 4.2 MIL/uL (ref 3.87–5.11)
RBC: 4.34 MIL/uL (ref 3.87–5.11)
RDW: 18 % — ABNORMAL HIGH (ref 11.5–15.5)
RDW: 17.9 % — AB (ref 11.5–15.5)
WBC: 12.9 10*3/uL — ABNORMAL HIGH (ref 4.0–10.5)
WBC: 14.2 10*3/uL — AB (ref 4.0–10.5)

## 2017-10-28 LAB — COMPREHENSIVE METABOLIC PANEL
ALK PHOS: 168 U/L — AB (ref 38–126)
ALT: 9 U/L — AB (ref 14–54)
AST: 14 U/L — AB (ref 15–41)
Albumin: 2.9 g/dL — ABNORMAL LOW (ref 3.5–5.0)
Anion gap: 7 (ref 5–15)
BILIRUBIN TOTAL: 0.9 mg/dL (ref 0.3–1.2)
CALCIUM: 8.6 mg/dL — AB (ref 8.9–10.3)
CO2: 22 mmol/L (ref 22–32)
CREATININE: 0.58 mg/dL (ref 0.44–1.00)
Chloride: 106 mmol/L (ref 101–111)
Glucose, Bld: 73 mg/dL (ref 65–99)
Potassium: 3.7 mmol/L (ref 3.5–5.1)
Sodium: 135 mmol/L (ref 135–145)
TOTAL PROTEIN: 6.5 g/dL (ref 6.5–8.1)

## 2017-10-28 LAB — PROTEIN / CREATININE RATIO, URINE
CREATININE, URINE: 96 mg/dL
PROTEIN CREATININE RATIO: 0.15 mg/mg{creat} (ref 0.00–0.15)
Total Protein, Urine: 14 mg/dL

## 2017-10-28 LAB — TYPE AND SCREEN
ABO/RH(D): A POS
Antibody Screen: NEGATIVE

## 2017-10-28 LAB — AMNISURE RUPTURE OF MEMBRANE (ROM) NOT AT ARMC: Amnisure ROM: NEGATIVE

## 2017-10-28 LAB — RPR: RPR: NONREACTIVE

## 2017-10-28 SURGERY — LIGATION, FALLOPIAN TUBE, POSTPARTUM
Anesthesia: Epidural | Site: Abdomen | Laterality: Bilateral | Wound class: Clean Contaminated

## 2017-10-28 MED ORDER — FENTANYL CITRATE (PF) 100 MCG/2ML IJ SOLN: 100.0000 ug | INTRAMUSCULAR | Status: DC | PRN

## 2017-10-28 MED ORDER — LACTATED RINGERS IV SOLN
INTRAVENOUS | Status: DC | PRN
  Administered 2017-10-28 (×2): via INTRAVENOUS

## 2017-10-28 MED ORDER — MIDAZOLAM HCL 2 MG/2ML IJ SOLN
INTRAMUSCULAR | Status: DC | PRN
  Administered 2017-10-28: 2 mg via INTRAVENOUS

## 2017-10-28 MED ORDER — SODIUM BICARBONATE 8.4 % IV SOLN
INTRAVENOUS | Status: AC
  Filled 2017-10-28: qty 50

## 2017-10-28 MED ORDER — COCONUT OIL OIL: 1.0000 "application " | TOPICAL_OIL | Status: DC | PRN

## 2017-10-28 MED ORDER — TETANUS-DIPHTH-ACELL PERTUSSIS 5-2.5-18.5 LF-MCG/0.5 IM SUSP: 0.5000 mL | Freq: Once | INTRAMUSCULAR | Status: DC

## 2017-10-28 MED ORDER — IBUPROFEN 600 MG PO TABS
600.0000 mg | ORAL_TABLET | Freq: Four times a day (QID) | ORAL | Status: DC
  Administered 2017-10-28 – 2017-10-30 (×7): 600 mg via ORAL
  Filled 2017-10-28 (×7): qty 1

## 2017-10-28 MED ORDER — SENNOSIDES-DOCUSATE SODIUM 8.6-50 MG PO TABS
2.0000 | ORAL_TABLET | ORAL | Status: DC
  Administered 2017-10-28 – 2017-10-30 (×2): 2 via ORAL
  Filled 2017-10-28 (×2): qty 2

## 2017-10-28 MED ORDER — SIMETHICONE 80 MG PO CHEW: 80.0000 mg | CHEWABLE_TABLET | ORAL | Status: DC | PRN

## 2017-10-28 MED ORDER — CEFAZOLIN SODIUM-DEXTROSE 2-4 GM/100ML-% IV SOLN
INTRAVENOUS | Status: AC
  Filled 2017-10-28: qty 100

## 2017-10-28 MED ORDER — SODIUM CHLORIDE 0.9 % IV SOLN: 250.0000 mL | INTRAVENOUS | Status: DC | PRN

## 2017-10-28 MED ORDER — LIDOCAINE HCL (PF) 1 % IJ SOLN
30.0000 mL | INTRAMUSCULAR | Status: DC | PRN
  Filled 2017-10-28: qty 30

## 2017-10-28 MED ORDER — MEASLES, MUMPS & RUBELLA VAC ~~LOC~~ INJ
0.5000 mL | INJECTION | Freq: Once | SUBCUTANEOUS | Status: DC
  Filled 2017-10-28: qty 0.5

## 2017-10-28 MED ORDER — SODIUM CHLORIDE 0.9% FLUSH: 3.0000 mL | INTRAVENOUS | Status: DC | PRN

## 2017-10-28 MED ORDER — WITCH HAZEL-GLYCERIN EX PADS: 1.0000 "application " | MEDICATED_PAD | CUTANEOUS | Status: DC | PRN

## 2017-10-28 MED ORDER — LIDOCAINE-EPINEPHRINE (PF) 2 %-1:200000 IJ SOLN
INTRAMUSCULAR | Status: DC | PRN
  Administered 2017-10-28: 3 mL via EPIDURAL
  Administered 2017-10-28: 5 mL via EPIDURAL

## 2017-10-28 MED ORDER — ACETAMINOPHEN 325 MG PO TABS
650.0000 mg | ORAL_TABLET | Freq: Once | ORAL | Status: AC
  Administered 2017-10-28: 650 mg via ORAL
  Filled 2017-10-28: qty 2

## 2017-10-28 MED ORDER — EPHEDRINE 5 MG/ML INJ
10.0000 mg | INTRAVENOUS | Status: DC | PRN
  Filled 2017-10-28: qty 2

## 2017-10-28 MED ORDER — BISACODYL 10 MG RE SUPP: 10.0000 mg | Freq: Every day | RECTAL | Status: DC | PRN

## 2017-10-28 MED ORDER — LIDOCAINE-EPINEPHRINE (PF) 2 %-1:200000 IJ SOLN
INTRAMUSCULAR | Status: DC | PRN
  Administered 2017-10-28 (×3): 5 mL via EPIDURAL

## 2017-10-28 MED ORDER — LIDOCAINE-EPINEPHRINE (PF) 2 %-1:200000 IJ SOLN
INTRAMUSCULAR | Status: AC
  Filled 2017-10-28: qty 20

## 2017-10-28 MED ORDER — FENTANYL CITRATE (PF) 100 MCG/2ML IJ SOLN: 25.0000 ug | INTRAMUSCULAR | Status: DC | PRN

## 2017-10-28 MED ORDER — DIBUCAINE 1 % RE OINT: 1.0000 "application " | TOPICAL_OINTMENT | RECTAL | Status: DC | PRN

## 2017-10-28 MED ORDER — LACTATED RINGERS IV SOLN: 500.0000 mL | INTRAVENOUS | Status: DC | PRN

## 2017-10-28 MED ORDER — BUPIVACAINE HCL (PF) 0.25 % IJ SOLN
INTRAMUSCULAR | Status: DC | PRN
  Administered 2017-10-28: 30 mL
  Administered 2017-10-28: 28 mL

## 2017-10-28 MED ORDER — BENZOCAINE-MENTHOL 20-0.5 % EX AERO: 1.0000 "application " | INHALATION_SPRAY | CUTANEOUS | Status: DC | PRN

## 2017-10-28 MED ORDER — SOD CITRATE-CITRIC ACID 500-334 MG/5ML PO SOLN: 30.0000 mL | ORAL | Status: DC | PRN

## 2017-10-28 MED ORDER — ZOLPIDEM TARTRATE 5 MG PO TABS: 5.0000 mg | ORAL_TABLET | Freq: Every evening | ORAL | Status: DC | PRN

## 2017-10-28 MED ORDER — FENTANYL CITRATE (PF) 100 MCG/2ML IJ SOLN
INTRAMUSCULAR | Status: DC | PRN
  Administered 2017-10-28: 100 ug via INTRAVENOUS

## 2017-10-28 MED ORDER — SODIUM CHLORIDE 0.9% FLUSH: 3.0000 mL | Freq: Two times a day (BID) | INTRAVENOUS | Status: DC

## 2017-10-28 MED ORDER — PRENATAL MULTIVITAMIN CH
1.0000 | ORAL_TABLET | Freq: Every day | ORAL | Status: DC
  Administered 2017-10-29 – 2017-10-30 (×2): 1 via ORAL
  Filled 2017-10-28 (×2): qty 1

## 2017-10-28 MED ORDER — OXYTOCIN BOLUS FROM INFUSION
500.0000 mL | Freq: Once | INTRAVENOUS | Status: AC
  Administered 2017-10-28: 500 mL via INTRAVENOUS

## 2017-10-28 MED ORDER — CEFAZOLIN SODIUM-DEXTROSE 2-3 GM-%(50ML) IV SOLR
INTRAVENOUS | Status: DC | PRN
  Administered 2017-10-28: 2 g via INTRAVENOUS

## 2017-10-28 MED ORDER — DIPHENHYDRAMINE HCL 50 MG/ML IJ SOLN: 12.5000 mg | INTRAMUSCULAR | Status: DC | PRN

## 2017-10-28 MED ORDER — SODIUM CHLORIDE 0.9 % IR SOLN
Status: DC | PRN
  Administered 2017-10-28: 1

## 2017-10-28 MED ORDER — OXYCODONE-ACETAMINOPHEN 5-325 MG PO TABS
1.0000 | ORAL_TABLET | ORAL | Status: DC | PRN
  Administered 2017-10-30 (×2): 1 via ORAL
  Filled 2017-10-28 (×2): qty 1

## 2017-10-28 MED ORDER — PHENYLEPHRINE 40 MCG/ML (10ML) SYRINGE FOR IV PUSH (FOR BLOOD PRESSURE SUPPORT)
80.0000 ug | PREFILLED_SYRINGE | INTRAVENOUS | Status: DC | PRN
  Filled 2017-10-28: qty 5

## 2017-10-28 MED ORDER — LACTATED RINGERS IV SOLN
INTRAVENOUS | Status: DC
  Administered 2017-10-28 (×3): via INTRAVENOUS

## 2017-10-28 MED ORDER — ONDANSETRON HCL 4 MG/2ML IJ SOLN
4.0000 mg | Freq: Four times a day (QID) | INTRAMUSCULAR | Status: DC | PRN
  Administered 2017-10-28: 4 mg via INTRAVENOUS
  Filled 2017-10-28: qty 2

## 2017-10-28 MED ORDER — FENTANYL 2.5 MCG/ML BUPIVACAINE 1/10 % EPIDURAL INFUSION (WH - ANES)
14.0000 mL/h | INTRAMUSCULAR | Status: DC | PRN
  Administered 2017-10-28 (×2): 14 mL/h via EPIDURAL
  Filled 2017-10-28 (×2): qty 100

## 2017-10-28 MED ORDER — MISOPROSTOL 200 MCG PO TABS
ORAL_TABLET | ORAL | Status: AC
  Filled 2017-10-28: qty 4

## 2017-10-28 MED ORDER — MIDAZOLAM HCL 2 MG/2ML IJ SOLN
INTRAMUSCULAR | Status: AC
  Filled 2017-10-28: qty 2

## 2017-10-28 MED ORDER — OXYCODONE-ACETAMINOPHEN 5-325 MG PO TABS: 1.0000 | ORAL_TABLET | ORAL | Status: DC | PRN

## 2017-10-28 MED ORDER — OXYCODONE-ACETAMINOPHEN 5-325 MG PO TABS
2.0000 | ORAL_TABLET | ORAL | Status: DC | PRN
  Administered 2017-10-29 (×2): 2 via ORAL
  Filled 2017-10-28 (×2): qty 2

## 2017-10-28 MED ORDER — ACETAMINOPHEN 325 MG PO TABS: 650.0000 mg | ORAL_TABLET | ORAL | Status: DC | PRN

## 2017-10-28 MED ORDER — LACTATED RINGERS IV SOLN
500.0000 mL | Freq: Once | INTRAVENOUS | Status: AC
  Administered 2017-10-28: 500 mL via INTRAVENOUS

## 2017-10-28 MED ORDER — ONDANSETRON HCL 4 MG/2ML IJ SOLN: 4.0000 mg | INTRAMUSCULAR | Status: DC | PRN

## 2017-10-28 MED ORDER — BUPIVACAINE HCL (PF) 0.25 % IJ SOLN
INTRAMUSCULAR | Status: AC
  Filled 2017-10-28: qty 30

## 2017-10-28 MED ORDER — DIPHENHYDRAMINE HCL 25 MG PO CAPS
25.0000 mg | ORAL_CAPSULE | Freq: Four times a day (QID) | ORAL | Status: DC | PRN
Start: 2017-10-28 — End: 2017-10-30

## 2017-10-28 MED ORDER — FENTANYL CITRATE (PF) 100 MCG/2ML IJ SOLN
INTRAMUSCULAR | Status: AC
  Filled 2017-10-28: qty 2

## 2017-10-28 MED ORDER — PHENYLEPHRINE 40 MCG/ML (10ML) SYRINGE FOR IV PUSH (FOR BLOOD PRESSURE SUPPORT)
80.0000 ug | PREFILLED_SYRINGE | INTRAVENOUS | Status: DC | PRN
  Filled 2017-10-28: qty 10
  Filled 2017-10-28: qty 5

## 2017-10-28 MED ORDER — OXYCODONE-ACETAMINOPHEN 5-325 MG PO TABS: 2.0000 | ORAL_TABLET | ORAL | Status: DC | PRN

## 2017-10-28 MED ORDER — OXYTOCIN 40 UNITS IN LACTATED RINGERS INFUSION - SIMPLE MED
2.5000 [IU]/h | INTRAVENOUS | Status: DC
  Administered 2017-10-28: 2.5 [IU]/h via INTRAVENOUS
  Filled 2017-10-28: qty 1000

## 2017-10-28 MED ORDER — FLEET ENEMA 7-19 GM/118ML RE ENEM: 1.0000 | ENEMA | Freq: Every day | RECTAL | Status: DC | PRN

## 2017-10-28 MED ORDER — ACETAMINOPHEN 325 MG PO TABS
650.0000 mg | ORAL_TABLET | ORAL | Status: DC | PRN
  Administered 2017-10-28: 650 mg via ORAL
  Filled 2017-10-28: qty 2

## 2017-10-28 MED ORDER — ONDANSETRON HCL 4 MG PO TABS: 4.0000 mg | ORAL_TABLET | ORAL | Status: DC | PRN

## 2017-10-28 SURGICAL SUPPLY — 21 items
BLADE SURG 11 STRL SS (BLADE) ×3 IMPLANT
CLOTH BEACON ORANGE TIMEOUT ST (SAFETY) ×3 IMPLANT
DRSG OPSITE POSTOP 3X4 (GAUZE/BANDAGES/DRESSINGS) ×3 IMPLANT
DURAPREP 26ML APPLICATOR (WOUND CARE) ×3 IMPLANT
GLOVE BIOGEL PI IND STRL 7.0 (GLOVE) ×1 IMPLANT
GLOVE BIOGEL PI IND STRL 7.5 (GLOVE) ×1 IMPLANT
GLOVE BIOGEL PI INDICATOR 7.0 (GLOVE) ×2
GLOVE BIOGEL PI INDICATOR 7.5 (GLOVE) ×2
GLOVE ECLIPSE 7.5 STRL STRAW (GLOVE) ×3 IMPLANT
GOWN STRL REUS W/TWL LRG LVL3 (GOWN DISPOSABLE) ×6 IMPLANT
NEEDLE HYPO 22GX1.5 SAFETY (NEEDLE) ×3 IMPLANT
NS IRRIG 1000ML POUR BTL (IV SOLUTION) ×3 IMPLANT
PACK ABDOMINAL MINOR (CUSTOM PROCEDURE TRAY) ×3 IMPLANT
PROTECTOR NERVE ULNAR (MISCELLANEOUS) ×3 IMPLANT
SPONGE LAP 4X18 X RAY DECT (DISPOSABLE) IMPLANT
SUT VICRYL 0 UR6 27IN ABS (SUTURE) ×3 IMPLANT
SUT VICRYL 4-0 PS2 18IN ABS (SUTURE) ×3 IMPLANT
SYR CONTROL 10ML LL (SYRINGE) ×3 IMPLANT
TOWEL OR 17X24 6PK STRL BLUE (TOWEL DISPOSABLE) ×6 IMPLANT
TRAY FOLEY CATH SILVER 14FR (SET/KITS/TRAYS/PACK) ×3 IMPLANT
WATER STERILE IRR 1000ML POUR (IV SOLUTION) ×3 IMPLANT

## 2017-10-28 NOTE — Anesthesia Procedure Notes (Signed)
Procedure Name: MAC Date/Time: 10/28/2017 6:05 PM Performed by: Genevie Ann, CRNA Pre-anesthesia Checklist: Patient identified, Timeout performed, Emergency Drugs available, Suction available and Patient being monitored

## 2017-10-28 NOTE — Anesthesia Preprocedure Evaluation (Signed)
Anesthesia Evaluation  Patient identified by MRN, date of birth, ID band Patient awake    History of Anesthesia Complications Negative for: history of anesthetic complications  Airway Mallampati: I  TM Distance: >3 FB     Dental no notable dental hx.    Pulmonary neg pulmonary ROS, former smoker,    Pulmonary exam normal        Cardiovascular hypertension, negative cardio ROS   Rhythm:Regular Rate:Normal     Neuro/Psych  Headaches, negative psych ROS   GI/Hepatic negative GI ROS, Neg liver ROS,   Endo/Other  negative endocrine ROS  Renal/GU negative Renal ROS  negative genitourinary   Musculoskeletal negative musculoskeletal ROS (+)   Abdominal   Peds negative pediatric ROS (+)  Hematology negative hematology ROS (+)   Anesthesia Other Findings   Reproductive/Obstetrics (+) Pregnancy                             Anesthesia Physical Anesthesia Plan  ASA: II  Anesthesia Plan: Epidural   Post-op Pain Management:    Induction:   PONV Risk Score and Plan:   Airway Management Planned:   Additional Equipment:   Intra-op Plan:   Post-operative Plan:   Informed Consent: I have reviewed the patients History and Physical, chart, labs and discussed the procedure including the risks, benefits and alternatives for the proposed anesthesia with the patient or authorized representative who has indicated his/her understanding and acceptance.     Plan Discussed with:   Anesthesia Plan Comments:         Anesthesia Quick Evaluation

## 2017-10-28 NOTE — Anesthesia Pain Management Evaluation Note (Signed)
  CRNA Pain Management Visit Note  Patient: Natasha Smith, 27 y.o., female  "Hello I am a [facility] of the anesthesia [facility]. We have an anesthesia team available at all times to provide care throughout the hospital, including epidural management and anesthesia for C-section. I don't know your plan for the delivery whether it a natural birth, water birth, IV sedation, nitrous supplementation, doula or epidural, but we want to meet your pain goals."   1.Was your pain managed to your expectations on prior hospitalizations?   Yes   2.What is your expectation for pain management during this hospitalization?     Epidural  3.How can we help you reach that goal? Support prn  Record the patient's initial score and the patient's pain goal.   Pain: 0  Pain Goal: 4 [facility] wants you to be able to say your pain was always managed very well.  [facility] Center[facility],Natasha Desroches 10/28/2017

## 2017-10-28 NOTE — Anesthesia Procedure Notes (Signed)
Epidural Patient location during procedure: OB  Staffing Anesthesiologist: Odette FractionHarkins, Alvin Diffee, MD Performed: anesthesiologist   Preanesthetic Checklist Completed: patient identified, site marked, surgical consent, pre-op evaluation, timeout performed, IV checked, risks and benefits discussed and monitors and equipment checked  Epidural Patient position: sitting Prep: DuraPrep Patient monitoring: heart rate, continuous pulse ox and blood pressure Location: L4-L5 Injection technique: LOR saline  Needle:  Needle type: Tuohy  Needle gauge: 17 G Needle length: 9 cm and 9 Needle insertion depth: 6 cm Catheter type: closed end flexible Catheter size: 20 Guage Catheter at skin depth: 12 cm Test dose: negative and 2% lidocaine with Epi 1:200 K  Assessment Sensory level: T9 Events: blood not aspirated, injection not painful, no injection resistance, negative IV test and no paresthesia  Additional Notes Patient identified. Risks/Benefits/Options discussed with patient including but not limited to bleeding, infection, nerve damage, paralysis, failed block, incomplete pain control, headache, blood pressure changes, nausea, vomiting, reactions to medication both or allergic, itching and postpartum back pain. Confirmed with bedside nurse the patient's most recent platelet count. Confirmed with patient that they are not currently taking any anticoagulation, have any bleeding history or any family history of bleeding disorders. Patient expressed understanding and wished to proceed. All questions were answered. Sterile technique was used throughout the entire procedure. Please see nursing notes for vital signs. Test dose was given through epidural needle and negative prior to continuing to dose epidural or start infusion. Warning signs of high block given to the patient including shortness of breath, tingling/numbness in hands, complete motor block, or any concerning symptoms with instructions to call for  help. Patient was given instructions on fall risk and not to get out of bed. All questions and concerns addressed with instructions to call with any issues.

## 2017-10-28 NOTE — Progress Notes (Signed)
Natasha Smith was notified of elevated blood pressure of 130/92.

## 2017-10-28 NOTE — Op Note (Signed)
Natasha Smith 10/28/2017  PREOPERATIVE DIAGNOSIS:  Undesired fertility  POSTOPERATIVE DIAGNOSIS:  Undesired fertility  PROCEDURE:  Postpartum Bilateral Tubal Sterilization using Filshie Clips   SURGEON:  Dr Candelaria CelesteJacob Iraida Cragin  ANESTHESIA:  Epidural  COMPLICATIONS:  None immediate.  ESTIMATED BLOOD LOSS:  Less than 20cc.  FLUIDS: 1000 mL LR.  URINE OUTPUT:  900 mL of clear urine.  INDICATIONS: 27 y.o. yo [id number]  with undesired fertility,status post vaginal delivery, desires permanent sterilization. Risks and benefits of procedure discussed with patient including permanence of method, bleeding, infection, injury to surrounding organs and need for additional procedures. Risk failure of 0.5-1% with increased risk of ectopic gestation if pregnancy occurs was also discussed with patient.   FINDINGS:  Normal uterus, tubes, and ovaries.  TECHNIQUE:  The patient was taken to the operating room where her epidural anesthesia was dosed up to surgical level and found to be adequate.  She was then placed in the dorsal supine position and prepped and draped in sterile fashion.  After an adequate timeout was performed, attention was turned to the patient's abdomen where a small transverse skin incision was made through the umbilical fold. At this point, an 8mm hernia was noted. The incision was taken down to the layer of fascia using the scalpel, and fascia was incised, and extended bilaterally using Mayo scissors. The peritoneum was entered in a sharp fashion. At this point, a foreign object fell onto the drape. The area was covered with a sterile towel and the patient was given Ancef 2g as prophylaxis.   Attention was then turned to the patient's uterus. The patient's left fallopian tube was then identified, and the Babcock clamp was then used to grasp the tube approximately 3 cm from the cornual region. A 3 cm segment of the tube was then doubly ligated with free tie of plain gut suture, transected and  excised. Good hemostasis was noted. The right fallopian tube was then identified, doubly ligated, and a 3 cm segment excised in a similar fashion allowing for bilateral tubal sterilization. Excellent hemostasis was noted.  Local analgesia was drizzled on both operative sites.The instruments were then removed from the patient's abdomen and the fascial incision was repaired with 0 Vicryl and care was taken to incorporate healthy fascia to repair the hernia. The skin was closed with a 3-0 Monocryl subcuticular stitch. The patient tolerated the procedure well.  Sponge, lap, and needle counts were correct times two.  The patient was then taken to the recovery room awake, extubated and in stable condition.   Levie HeritageStinson, Mykeal Carrick J, DO 10/28/2017 6:57 PM

## 2017-10-28 NOTE — Progress Notes (Signed)
Patient ID: Natasha Smith, female   DOB: 07/25/1990, 27 y.o.   MRN: [id number] Natasha Smith is a 27 y.o. [phone] at 5265w3d admitted for active labor  Subjective:   Objective: BP (!) 130/95 (BP Location: Left Arm)   Pulse 90   Temp 97.9 F (36.6 C) (Axillary)   Resp 20   Ht 5\' 4"  (1.626 m)   Wt 85.7 kg (189 lb)   SpO2 100%   BMI 32.44 kg/m  No intake/output data recorded.  FHT:  FHR: 125 bpm, variability: moderate,  accelerations:  Present,  decelerations:  Absent UC:   q 2-33mins  SVE:   Dilation: Lip/rim Effacement (%): 100 Station: Plus 1 Exam by:: Enis SlipperJane Bailey, RN  Labs: Lab Results  Component Value Date   WBC 12.9 (H) 10/28/2017   HGB 9.8 (L) 10/28/2017   HCT 29.4 (L) 10/28/2017   MCV 70.0 (L) 10/28/2017   PLT 174 10/28/2017    Assessment / Plan: Spontaneous labor, progressing normally  Labor: Progressing normally Fetal Wellbeing:  Category I Pain Control:  Epidural Pre-eclampsia: n/a I/D:  n/a Anticipated MOD:  NSVD  Cheral MarkerKimberly R Estel Scholze CNM, WHNP-BC 10/28/2017, 2:47 PM

## 2017-10-28 NOTE — Progress Notes (Signed)
Patient Name: Natasha Smith, female   DOB: [birth year], 27 y.o.  MRN: [id number]  Risks of procedure discussed with patient including but not limited to: risk of regret, permanence of method, bleeding, infection, injury to surrounding organs and need for additional procedures.  Failure risk of 1% was also discussed with patient.    Levie HeritageStinson, Davius Goudeau J, DO 10/28/2017 5:13 PM

## 2017-10-28 NOTE — MAU Note (Addendum)
Contractions 10-15 since 2000 and leaking of fluid since 1700, decrease FM.

## 2017-10-28 NOTE — Progress Notes (Signed)
Patient ID: Natasha Smith, female   DOB: May 20, 1991, 27 y.o.   MRN: [id number] Natasha Illshley Kingsbury is a 27 y.o. [phone] at 1373w3d admitted for active labor  Subjective: Comfortable w/ epidural, wants water broken  Objective: BP 138/90 (BP Location: Right Arm)   Pulse 79   Temp (!) 97.4 F (36.3 C) (Oral)   Resp 18   Ht 5\' 4"  (1.626 m)   Wt 85.7 kg (189 lb)   SpO2 100%   BMI 32.44 kg/m  No intake/output data recorded.  FHT:  FHR: 125 bpm, variability: moderate,  accelerations:  Present,  decelerations:  Absent UC:   q 2-26mins  SVE:   Dilation: 7 Effacement (%): 60 Station: -2 Exam by:: Joellyn HaffKim Gerik Coberly, CNM  AROM small amt clear fluid  Labs: Lab Results  Component Value Date   WBC 12.9 (H) 10/28/2017   HGB 9.8 (L) 10/28/2017   HCT 29.4 (L) 10/28/2017   MCV 70.0 (L) 10/28/2017   PLT 174 10/28/2017    Assessment / Plan: Spontaneous labor, progressing normally, now AROM'd Pt reports h/o PPH x 2, will have cytotec in room at birth  Labor: Progressing normally Fetal Wellbeing:  Category I Pain Control:  Epidural Pre-eclampsia: n/a I/D:  n/a Anticipated MOD:  NSVD  Cheral MarkerKimberly R Nadiyah Zeis CNM, WHNP-BC 10/28/2017, 11:16 AM

## 2017-10-28 NOTE — H&P (Signed)
Natasha Smith is a 27 y.o. female [phone] @ 37.3wks by 6wk U/S presenting for reg ctx. Denies current leaking or bldg.  No s/s HSV outbreak. 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  OB History    Gravida  9   Para  4   Term  4   Preterm  0   AB  4   Living  4     SAB  3   TAB  1   Ectopic  0   Multiple  0   Live Births  4          Past Medical History:  Diagnosis Date  . Abnormal Pap smear   . Headache   . Herpes simplex type II infection   . History of chlamydia infection   . Infection    UTI  . Pregnancy induced hypertension    hx  . Sickle cell trait [facility](HCC)    Past Surgical History:  Procedure Laterality Date  . KNEE SURGERY     Family History: family history includes Asthma in her brother, mother, and sister; Cancer in her maternal aunt; Diabetes in her maternal grandmother; Heart disease in her maternal grandfather; Hypertension in her mother. Social History:  reports that she has quit smoking. She has never used smokeless tobacco. She reports that she does not drink alcohol or use drugs.     Maternal Diabetes: No Genetic Screening: Declined Maternal Ultrasounds/Referrals: Normal Fetal Ultrasounds or other Referrals:  None Maternal Substance Abuse:  No Significant Maternal Medications:  None Significant Maternal Lab Results:  Lab values include: Group B Strep negative Other Comments:  None  ROS History Dilation: 6 Effacement (%): 70 Station: -2 Exam by:: ansah-mensah, rnc  Blood pressure 129/82, pulse 96, temperature 98.2 F (36.8 C), temperature source Oral, resp. rate 17, SpO2 99 %, unknown if currently breastfeeding. Exam Physical Exam  Constitutional: She is oriented to person, place, and time. She appears  well-developed.  HENT:  Head: Normocephalic.  Neck: Normal range of motion.  Cardiovascular: Normal rate.  Respiratory: Effort normal.  GI:  EFM 130s, +accels, no decels, occ variables Ctx q 3-6 mins, spont  Musculoskeletal: Normal range of motion.  Neurological: She is alert and oriented to person, place, and time.  Skin: Skin is warm and dry.  Psychiatric: She has a normal mood and affect. Her behavior is normal. Thought content normal.    Prenatal labs: ABO, Rh: A/Positive/-- (09/04 1046) Antibody: Negative (09/04 1046) Rubella: 20.00 (09/04 1046) RPR: Non Reactive (09/04 1046)  HBsAg: Negative (09/04 1046)  HIV: Non Reactive (09/04 1046)  GBS:   neg (10/13/17)  Assessment/Plan: IUP@term  Early active labor GBS neg  [facility] Anticipate SVD Plan SW consult pp for DV concerns BTL inpt if pt desires   Cam HaiSHAW, KIMBERLY CNM 10/28/2017, 3:22 AM

## 2017-10-28 NOTE — Anesthesia Preprocedure Evaluation (Signed)
Anesthesia Evaluation  Patient identified by MRN, date of birth, ID band Patient awake    Reviewed: Allergy & Precautions, H&P , Patient's Chart, lab work & pertinent test results  History of Anesthesia Complications Negative for: history of anesthetic complications  Airway Mallampati: I  TM Distance: >3 FB Neck ROM: full    Dental no notable dental hx. (+) Teeth Intact   Pulmonary neg pulmonary ROS, former smoker,    Pulmonary exam normal breath sounds clear to auscultation       Cardiovascular Exercise Tolerance: Good hypertension, negative cardio ROS   Rhythm:Regular Rate:Normal     Neuro/Psych  Headaches, negative psych ROS   GI/Hepatic negative GI ROS, Neg liver ROS,   Endo/Other  negative endocrine ROS  Renal/GU negative Renal ROS  negative genitourinary   Musculoskeletal negative musculoskeletal ROS (+)   Abdominal   Peds negative pediatric ROS (+)  Hematology negative hematology ROS (+)   Anesthesia Other Findings       Reproductive/Obstetrics (+) Pregnancy                             Anesthesia Physical  Anesthesia Plan  ASA: II  Anesthesia Plan: Epidural   Post-op Pain Management:    Induction:   PONV Risk Score and Plan:   Airway Management Planned:   Additional Equipment:   Intra-op Plan:   Post-operative Plan:   Informed Consent: I have reviewed the patients History and Physical, chart, labs and discussed the procedure including the risks, benefits and alternatives for the proposed anesthesia with the patient or authorized representative who has indicated his/her understanding and acceptance.     Plan Discussed with:   Anesthesia Plan Comments:         Anesthesia Quick Evaluation

## 2017-10-28 NOTE — Transfer of Care (Addendum)
Immediate Anesthesia Transfer of Care Note  Patient: Natasha Smith  Procedure(s) Performed: POST PARTUM TUBAL LIGATION (Bilateral Abdomen)  Patient Location: PACU  Anesthesia Type:Epidural  Level of Consciousness: awake, alert  and oriented  Airway & Oxygen Therapy: Patient Spontanous Breathing  Post-op Assessment: Report given to RN and Post -op Vital signs reviewed and stable  Post vital signs: Reviewed and stable  Last Vitals:  Vitals Value Taken Time  BP 157/90 10/28/2017  7:11 PM  Temp    Pulse 76 10/28/2017  7:12 PM  Resp    SpO2 100 % 10/28/2017  7:12 PM  Vitals shown include unvalidated device data.  Last Pain:  Vitals:   10/28/17 1800  TempSrc:   PainSc: 0-No pain         Complications: No apparent anesthesia complications

## 2017-10-28 NOTE — MAU Provider Note (Signed)
  S: Ms. Natasha Smith is a 27 y.o. [phone] at 8557w3d  who presents to MAU today complaining of leaking of fluid for a couple of days. She reports losing her mucus plug 2 days ago then having watery discharge since then. She denies vaginal bleeding. She endorses contractions. She reports normal fetal movement.    O: SpO2 99%  GENERAL: Well-developed, well-nourished female in no acute distress.  HEAD: Normocephalic, atraumatic.  CHEST: Normal effort of breathing, regular heart rate ABDOMEN: Soft, nontender, gravid PELVIC: Normal external female genitalia. Vagina is pink and rugated. Cervix with normal contour, no lesions. Moderate white thin discharge.  Negative pooling.   Cervical exam:  Dilation: 4 Effacement (%): 70 Station: -2 Presentation: Vertex Exam by:: ansah-mensah, cnc   Fetal Monitoring: Baseline: 135 Variability: moderate Accelerations: 15x15 Decelerations: none Contractions: 2-4 minutes   Results for orders placed or performed during the hospital encounter of 10/28/17 (from the past 24 hour(s))  Amnisure rupture of membrane (rom)[facility]     Status: None   Collection Time: 10/28/17  1:07 AM  Result Value Ref Range   Amnisure ROM NEGATIVE      A: SIUP at 4957w3d  Membranes intact  P: RN labor evaluation  Sharyon CableRogers, Natasha Smith C, CNM 10/28/2017 1:11 AM

## 2017-10-29 ENCOUNTER — Encounter (HOSPITAL_COMMUNITY): Payer: Self-pay | Admitting: Family Medicine

## 2017-10-29 LAB — CBC
HCT: 28.6 % — ABNORMAL LOW (ref 36.0–46.0)
HEMOGLOBIN: 9.2 g/dL — AB (ref 12.0–15.0)
MCH: 22.9 pg — ABNORMAL LOW (ref 26.0–34.0)
MCHC: 32.2 g/dL (ref 30.0–36.0)
MCV: 71.3 fL — ABNORMAL LOW (ref 78.0–100.0)
Platelets: 205 10*3/uL (ref 150–400)
RBC: 4.01 MIL/uL (ref 3.87–5.11)
RDW: 17.9 % — ABNORMAL HIGH (ref 11.5–15.5)
WBC: 15.8 10*3/uL — ABNORMAL HIGH (ref 4.0–10.5)

## 2017-10-29 MED ORDER — SIMETHICONE 80 MG PO CHEW: 80.0000 mg | CHEWABLE_TABLET | ORAL | Status: DC | PRN

## 2017-10-29 MED ORDER — PRENATAL MULTIVITAMIN CH: 1.0000 | ORAL_TABLET | Freq: Every day | ORAL | Status: DC

## 2017-10-29 MED ORDER — ACETAMINOPHEN 325 MG PO TABS
650.0000 mg | ORAL_TABLET | ORAL | Status: DC | PRN
  Administered 2017-10-30: 325 mg via ORAL
  Filled 2017-10-29: qty 2

## 2017-10-29 MED ORDER — BENZOCAINE-MENTHOL 20-0.5 % EX AERO: 1.0000 "application " | INHALATION_SPRAY | CUTANEOUS | Status: DC | PRN

## 2017-10-29 MED ORDER — ONDANSETRON HCL 4 MG/2ML IJ SOLN: 4.0000 mg | INTRAMUSCULAR | Status: DC | PRN

## 2017-10-29 MED ORDER — DIPHENHYDRAMINE HCL 25 MG PO CAPS: 25.0000 mg | ORAL_CAPSULE | Freq: Four times a day (QID) | ORAL | Status: DC | PRN

## 2017-10-29 MED ORDER — ONDANSETRON HCL 4 MG PO TABS: 4.0000 mg | ORAL_TABLET | ORAL | Status: DC | PRN

## 2017-10-29 MED ORDER — COCONUT OIL OIL: 1.0000 "application " | TOPICAL_OIL | Status: DC | PRN

## 2017-10-29 MED ORDER — LACTATED RINGERS IV BOLUS
1000.0000 mL | Freq: Once | INTRAVENOUS | Status: AC
  Administered 2017-10-29: 1000 mL via INTRAVENOUS

## 2017-10-29 MED ORDER — TETANUS-DIPHTH-ACELL PERTUSSIS 5-2.5-18.5 LF-MCG/0.5 IM SUSP: 0.5000 mL | Freq: Once | INTRAMUSCULAR | Status: DC

## 2017-10-29 MED ORDER — SENNOSIDES-DOCUSATE SODIUM 8.6-50 MG PO TABS: 2.0000 | ORAL_TABLET | ORAL | Status: DC

## 2017-10-29 MED ORDER — DIBUCAINE 1 % RE OINT: 1.0000 "application " | TOPICAL_OINTMENT | RECTAL | Status: DC | PRN

## 2017-10-29 MED ORDER — WITCH HAZEL-GLYCERIN EX PADS: 1.0000 "application " | MEDICATED_PAD | CUTANEOUS | Status: DC | PRN

## 2017-10-29 MED ORDER — ZOLPIDEM TARTRATE 5 MG PO TABS: 5.0000 mg | ORAL_TABLET | Freq: Every evening | ORAL | Status: DC | PRN

## 2017-10-29 MED ORDER — IBUPROFEN 600 MG PO TABS: 600.0000 mg | ORAL_TABLET | Freq: Four times a day (QID) | ORAL | Status: DC

## 2017-10-29 NOTE — Progress Notes (Signed)
Pt called out while sitting on the toilet that she was feeling dizzy and bleeding a lot around 0430. Dr. Adrian BlackwaterStinson was contacted to assess the bleeding. Her pad was completely saturated, and the final weight was 313 grams. Her fundus was firm, midline, and at U. Her blood pressures were 137/100 and 130/85 with a pulse of 67. Dr. Adrian BlackwaterStinson gave a verbal order for a 1000 mL bolus of lactated ringer's. No other orders given.

## 2017-10-29 NOTE — Progress Notes (Signed)
Patient stated she is doing ok, feels a little restless.

## 2017-10-29 NOTE — Progress Notes (Signed)
Post Partum Day 1 Subjective: no complaints, up ad lib, voiding and tolerating PO  Objective: Blood pressure 120/88, pulse 63, temperature 98.4 F (36.9 C), temperature source Oral, resp. rate 18, height 5\' 4"  (1.626 m), weight 189 lb (85.7 kg), SpO2 100 %, unknown if currently breastfeeding.  Physical Exam:  General: alert, cooperative, appears stated age and no distress Lochia: appropriate Uterine Fundus: firm Incision: healing well, no significant drainage, no dehiscence, no significant erythema DVT Evaluation: No evidence of DVT seen on physical exam.  Recent Labs    10/28/17 1640 10/29/17 0507  HGB 10.0* 9.2*  HCT 31.0* 28.6*    Assessment/Plan: Plan for discharge tomorrow   LOS: 1 day   Wyvonnia DuskyMarie Lawson 10/29/2017, 9:21 AM

## 2017-10-29 NOTE — Progress Notes (Signed)
Patient called RN, not feeling well, likely due to 2 percocet. Infant taken to nursery for patient to rest. Encouraged patient to not ambulate without assistance.

## 2017-10-29 NOTE — Progress Notes (Signed)
   10/29/17 0133 10/29/17 0135  Vital Signs  BP (!) 113/97 (!) 131/96  BP Location Right Arm Left Arm  Patient Position (if appropriate) Lying Lying  BP Method Automatic Automatic  Pulse Rate 73 69   Certified nurse midwife, Geraldine SolarVeronica Rodgers, notified of elevated blood pressures. No headache, epigastric pain, or visual disturbance. Cramping pain 9/10 in lower abdomen. Midwife stated to give her pain medication, and to continue to check blood pressures with assessments. No new orders or new parameters per midwife.

## 2017-10-29 NOTE — Anesthesia Postprocedure Evaluation (Signed)
Anesthesia Post Note  Patient: Natasha Smith  Procedure(s) Performed: AN AD HOC LABOR EPIDURAL     Patient location during evaluation: Mother Baby Anesthesia Type: Epidural Level of consciousness: awake Pain management: satisfactory to patient Vital Signs Assessment: post-procedure vital signs reviewed and stable Respiratory status: spontaneous breathing Cardiovascular status: stable Anesthetic complications: no    Last Vitals:  Vitals:   10/29/17 0447 10/29/17 0544  BP: 130/85 120/88  Pulse:  63  Resp: 18 18  Temp:  36.9 C  SpO2:  100%    Last Pain:  Vitals:   10/29/17 0803  TempSrc:   PainSc: 5    Pain Goal:                 Cephus ShellingBURGER,Kimble Delaurentis

## 2017-10-29 NOTE — Anesthesia Postprocedure Evaluation (Signed)
Anesthesia Post Note  Patient: Natasha Smith  Procedure(s) Performed: POST PARTUM TUBAL LIGATION (Bilateral Abdomen)     Patient location during evaluation: Mother Baby Anesthesia Type: Epidural Level of consciousness: awake Pain management: satisfactory to patient Vital Signs Assessment: post-procedure vital signs reviewed and stable Respiratory status: spontaneous breathing Cardiovascular status: stable Anesthetic complications: no    Last Vitals:  Vitals:   10/29/17 0447 10/29/17 0544  BP: 130/85 120/88  Pulse:  63  Resp: 18 18  Temp:  36.9 C  SpO2:  100%    Last Pain:  Vitals:   10/29/17 0803  TempSrc:   PainSc: 5    Pain Goal:                 Cephus ShellingBURGER,[name]

## 2017-10-30 ENCOUNTER — Telehealth: Payer: Self-pay | Admitting: *Deleted

## 2017-10-30 ENCOUNTER — Encounter (HOSPITAL_COMMUNITY): Payer: Self-pay

## 2017-10-30 MED ORDER — IBUPROFEN 600 MG PO TABS: 600.0000 mg | ORAL_TABLET | Freq: Four times a day (QID) | ORAL | 0 refills | Status: DC | PRN

## 2017-10-30 MED ORDER — OXYCODONE-ACETAMINOPHEN 5-325 MG PO TABS: 1.0000 | ORAL_TABLET | ORAL | 0 refills | Status: DC | PRN

## 2017-10-30 NOTE — Telephone Encounter (Signed)
Covering inbox for Dr. Natale MilchLancaster We would be happy to take care of that here. Could patient come in for appointment to discuss current mood, fill out PHQ9 etc? Thanks.  Dolores PattyAngela Bailey Faiella, DO PGY-2, [facility] 10/30/2017 2:49 PM

## 2017-10-30 NOTE — Telephone Encounter (Signed)
Natasha CuminsCollen called to inform Natasha Smith that pt is interested in restarting her zoloft now that she has delivered.  She wanted to know if Dr. Natale MilchLancaster will restart or if she should speak with the OB there. Chinmayi Rumer, [name], CMA

## 2017-10-30 NOTE — Discharge Summary (Signed)
OB Discharge Summary     Patient Name: Natasha Smith DOB: [birth year] MRN: [id number]  Date of admission: 10/28/2017 Delivering MD: Shawna Clamp R   Date of discharge: 10/30/2017  Admitting diagnosis: 37 wks leaking and having lower back pain and abd pain told to come yesterday but did not Intrauterine pregnancy: [redacted]w[redacted]d     Secondary diagnosis:  Active Problems:   OBESITY   HSV-2 (herpes simplex virus 2) infection   Gestational thrombocytopenia (HCC)   Elevated BP without diagnosis of hypertension   Labor and delivery indication for care or intervention   Gestational hypertension  Additional problems: desires sterilization     Discharge diagnosis: Term Pregnancy Delivered and Gestational Hypertension                                                                                                Post partum procedures:postpartum tubal ligation  Augmentation: AROM  Complications: None  Hospital course:  Onset of Labor With Vaginal Delivery     27 y.o. yo [id number] at [redacted]w[redacted]d was admitted in Active Labor on 10/28/2017. Patient had an labor course remarkable for some elevated BPs, none in the severe range. Pre-e labs were normal.  Membrane Rupture Time/Date: 10:21 AM ,10/28/2017   Intrapartum Procedures: Episiotomy: None [1]                                         Lacerations:  None [1]  Patient had a delivery of a Viable infant. 10/28/2017  Information for the patient's newborn:  Madalynn, [place] [[id number]]       Pateint had an uncomplicated postpartum course.  She had a BTL a few hours after delivery and tolerated the procedure well.  She is ambulating, tolerating a regular diet, passing flatus, and urinating well. During her PP stay she had some borderline BPs but was not started on medication. Patient is discharged home in stable condition on 10/30/17 and will have a BP check within the week.   Physical exam  Vitals:   10/29/17 1357 10/29/17 1742 10/29/17 2135 10/30/17 0639   BP: 127/75 (!) 141/82 127/83 133/88  Pulse:  68 70 62  Resp: 18 18 18 18   Temp: 98.3 F (36.8 C) 98 F (36.7 C) 98.4 F (36.9 C) 97.8 F (36.6 C)  TempSrc: Oral Oral Oral Oral  SpO2:   100%   Weight:      Height:       General: alert and cooperative Lochia: appropriate Uterine Fundus: firm Incision: umbilical honeycomb intact, sm area unchanged DVT Evaluation: No evidence of DVT seen on physical exam. Labs: Lab Results  Component Value Date   WBC 15.8 (H) 10/29/2017   HGB 9.2 (L) 10/29/2017   HCT 28.6 (L) 10/29/2017   MCV 71.3 (L) 10/29/2017   PLT 205 10/29/2017   CMP Latest Ref Rng & Units 10/28/2017  Glucose 65 - 99 mg/dL 73  BUN 6 - 20 mg/dL <9(B)  Creatinine 2.84 - 1.00 mg/dL 1.32  Sodium 440 -  145 mmol/L 135  Potassium 3.5 - 5.1 mmol/L 3.7  Chloride 101 - 111 mmol/L 106  CO2 22 - 32 mmol/L 22  Calcium 8.9 - 10.3 mg/dL 1.6(X8.6(L)  Total Protein 6.5 - 8.1 g/dL 6.5  Total Bilirubin 0.3 - 1.2 mg/dL 0.9  Alkaline Phos 38 - 126 U/L 168(H)  AST 15 - 41 U/L 14(L)  ALT 14 - 54 U/L 9(L)    Discharge instruction: per After Visit Summary and "Baby and Me Booklet".  After visit meds:  Allergies as of 10/30/2017   No Known Allergies     Medication List    STOP taking these medications   acetaminophen 325 MG tablet Commonly known as:  TYLENOL     TAKE these medications   ibuprofen 600 MG tablet Commonly known as:  ADVIL,MOTRIN Take 1 tablet (600 mg total) by mouth every 6 (six) hours as needed.   oxyCODONE-acetaminophen 5-325 MG tablet Commonly known as:  PERCOCET/ROXICET Take 1 tablet by mouth every 4 (four) hours as needed (pain scale 4-7).       Diet: routine diet  Activity: Advance as tolerated. Pelvic rest for 6 weeks.   Outpatient follow up:1 week BP check, then 4 wk PP visit Follow up Appt:No future appointments. Follow up Visit:No follow-ups on file.  Postpartum contraception: Tubal Ligation  Newborn Data: Live born female  Birth Weight: 7 lb  10.6 oz (3476 g) APGAR: 8, 9  Newborn Delivery   Birth date/time:  10/28/2017 15:26:00 Delivery type:  Vaginal, Spontaneous     Baby Feeding: Bottle Disposition:home with mother   10/30/2017 Cam HaiSHAW, Jaasiel [place], CNM 9:46 AM

## 2017-10-30 NOTE — Discharge Instructions (Signed)
Laparoscopic Tubal Ligation, Care After Refer to this sheet in the next few weeks. These instructions provide you with information about caring for yourself after your procedure. Your health care provider may also give you more specific instructions. Your treatment has been planned according to current medical practices, but problems sometimes occur. Call your health care provider if you have any problems or questions after your procedure. What can I expect after the procedure? After the procedure, it is common to have:  A sore throat.  Discomfort in your shoulder.  Mild discomfort or cramping in your abdomen.  Gas pains.  Pain or soreness in the area where the surgical cut (incision) was made.  A bloated feeling.  Tiredness.  Nausea.  Vomiting.  Follow these instructions at home: Medicines  Take over-the-counter and prescription medicines only as told by your health care provider.  Do not take aspirin because it can cause bleeding.  Do not drive or operate heavy machinery while taking prescription pain medicine. Activity  Rest for the rest of the day.  Return to your normal activities as told by your health care provider. Ask your health care provider what activities are safe for you. Incision care   Follow instructions from your health care provider about how to take care of your incision. Make sure you: ? Wash your hands with soap and water before you change your bandage (dressing). If soap and water are not available, use hand sanitizer. ? Change your dressing as told by your health care provider. ? Leave stitches (sutures) in place. They may need to stay in place for 2 weeks or longer.  Check your incision area every day for signs of infection. Check for: ? More redness, swelling, or pain. ? More fluid or blood. ? Warmth. ? Pus or a bad smell. Other Instructions  Do not take baths, swim, or use a hot tub until your health care provider approves. You may take  showers.  Keep all follow-up visits as told by your health care provider. This is important.  Have someone help you with your daily household tasks for the first few days. Contact a health care provider if:  You have more redness, swelling, or pain around your incision.  Your incision feels warm to the touch.  You have pus or a bad smell coming from your incision.  The edges of your incision break open after the sutures have been removed.  Your pain does not improve after 2-3 days.  You have a rash.  You repeatedly become dizzy or light-headed.  Your pain medicine is not helping.  You are constipated. Get help right away if:  You have a fever.  You faint.  You have increasing pain in your abdomen.  You have severe pain in one or both of your shoulders.  You have fluid or blood coming from your sutures or from your vagina.  You have shortness of breath or difficulty breathing.  You have chest pain or leg pain.  You have ongoing nausea, vomiting, or diarrhea. This information is not intended to replace advice given to you by your health care provider. Make sure you discuss any questions you have with your health care provider. Document Released: 01/14/2005 Document Revised: 11/30/2015 Document Reviewed: 06/07/2015 Elsevier Interactive Patient Education  2018 [employer] Vaginal Delivery, Care After Refer to this sheet in the next few weeks. These instructions provide you with information about caring for yourself after vaginal delivery. Your health care provider may also give you more  specific instructions. Your treatment has been planned according to current medical practices, but problems sometimes occur. Call your health care provider if you have any problems or questions. What can I expect after the procedure? After vaginal delivery, it is common to have:  Some bleeding from your vagina.  Soreness in your abdomen, your vagina, and the area of skin between your  vaginal opening and your anus (perineum).  Pelvic cramps.  Fatigue.  Follow these instructions at home: Medicines  Take over-the-counter and prescription medicines only as told by your health care provider.  If you were prescribed an antibiotic medicine, take it as told by your health care provider. Do not stop taking the antibiotic until it is finished. Driving   Do not drive or operate heavy machinery while taking prescription pain medicine.  Do not drive for 24 hours if you received a sedative. Lifestyle  Do not drink alcohol. This is especially important if you are breastfeeding or taking medicine to relieve pain.  Do not use tobacco products, including cigarettes, chewing tobacco, or e-cigarettes. If you need help quitting, ask your health care provider. Eating and drinking  Drink at least 8 eight-ounce glasses of water every day unless you are told not to by your health care provider. If you choose to breastfeed your baby, you may need to drink more water than this.  Eat high-fiber foods every day. These foods may help prevent or relieve constipation. High-fiber foods include: ? Whole grain cereals and breads. ? Brown rice. ? Beans. ? Fresh fruits and vegetables. Activity  Return to your normal activities as told by your health care provider. Ask your health care provider what activities are safe for you.  Rest as much as possible. Try to rest or take a nap when your baby is sleeping.  Do not lift anything that is heavier than your baby or 10 lb (4.5 kg) until your health care provider says that it is safe.  Talk with your health care provider about when you can engage in sexual activity. This may depend on your: ? Risk of infection. ? Rate of healing. ? Comfort and desire to engage in sexual activity. Vaginal Care  If you have an episiotomy or a vaginal tear, check the area every day for signs of infection. Check for: ? More redness, swelling, or pain. ? More  fluid or blood. ? Warmth. ? Pus or a bad smell.  Do not use tampons or douches until your health care provider says this is safe.  Watch for any blood clots that may pass from your vagina. These may look like clumps of dark red, brown, or black discharge. General instructions  Keep your perineum clean and dry as told by your health care provider.  Wear loose, comfortable clothing.  Wipe from front to back when you use the toilet.  Ask your health care provider if you can shower or take a bath. If you had an episiotomy or a perineal tear during labor and delivery, your health care provider may tell you not to take baths for a certain length of time.  Wear a bra that supports your breasts and fits you well.  If possible, have someone help you with household activities and help care for your baby for at least a few days after you leave the hospital.  Keep all follow-up visits for you and your baby as told by your health care provider. This is important. Contact a health care provider if:  You have: ?  Vaginal discharge that has a bad smell. ? Difficulty urinating. ? Pain when urinating. ? A sudden increase or decrease in the frequency of your bowel movements. ? More redness, swelling, or pain around your episiotomy or vaginal tear. ? More fluid or blood coming from your episiotomy or vaginal tear. ? Pus or a bad smell coming from your episiotomy or vaginal tear. ? A fever. ? A rash. ? Little or no interest in activities you used to enjoy. ? Questions about caring for yourself or your baby.  Your episiotomy or vaginal tear feels warm to the touch.  Your episiotomy or vaginal tear is separating or does not appear to be healing.  Your breasts are painful, hard, or turn red.  You feel unusually sad or worried.  You feel nauseous or you vomit.  You pass large blood clots from your vagina. If you pass a blood clot from your vagina, save it to show to your health care provider. Do  not flush blood clots down the toilet without having your health care provider look at them.  You urinate more than usual.  You are dizzy or light-headed.  You have not breastfed at all and you have not had a menstrual period for 12 weeks after delivery.  You have stopped breastfeeding and you have not had a menstrual period for 12 weeks after you stopped breastfeeding. Get help right away if:  You have: ? Pain that does not go away or does not get better with medicine. ? Chest pain. ? Difficulty breathing. ? Blurred vision or spots in your vision. ? Thoughts about hurting yourself or your baby.  You develop pain in your abdomen or in one of your legs.  You develop a severe headache.  You faint.  You bleed from your vagina so much that you fill two sanitary pads in one hour. This information is not intended to replace advice given to you by your health care provider. Make sure you discuss any questions you have with your health care provider. Document Released: 06/24/2000 Document Revised: 12/09/2015 Document Reviewed: 07/12/2015 Elsevier Interactive Patient Education  2018 [employer]

## 2017-10-30 NOTE — Telephone Encounter (Signed)
Pt has appt on Monday (11/06/17) to discuss medication. [name], [name], CMA

## 2017-10-31 NOTE — Telephone Encounter (Signed)
Great thank you!

## 2017-11-01 ENCOUNTER — Telehealth: Payer: Self-pay | Admitting: *Deleted

## 2017-11-01 NOTE — Telephone Encounter (Signed)
-----   Message from Tillman SersAngela C Riccio, DO sent at 10/31/2017 11:35 PM EDT ----- Regarding: FW: BP check Covering inbox for Dr. Natale MilchLancaster- this OB pt had borderline elevated bp.  Can we make an appointment for her to come in for nurse BP check? Thank you !  ----- Message ----- From: Arabella MerlesShaw, Kimberly D, CNM Sent: 10/30/2017   9:39 AM To: Marquette SaaAbigail Joseph Lancaster, MD Subject: BP check                                       Hi Abby,  This pt needs a BP check this week or early next week- she had some borderline pressures at d/c.  Thanks! Selena BattenKim
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
## 2017-11-01 NOTE — Telephone Encounter (Signed)
LMOVM for pt to call back and make an appt for a nurse bp check Maree Erieeseree Blount, CMA

## 2017-11-06 ENCOUNTER — Ambulatory Visit: Payer: Medicaid Other | Admitting: Internal Medicine

## 2017-11-07 ENCOUNTER — Telehealth: Payer: Self-pay

## 2017-11-07 NOTE — Telephone Encounter (Signed)
Merita Norton- RN with [facility] calling regarding patient. Saw patient at home today and calling to update MD. Pt's BP 146/102, denies any HA, blurred vision or swelling. Also wanting to inform MD pt has edinburgh score of 17 and needs a refill of her zoloft. Pt's phone number [phone] RN is scheduled to go back next week to see patient and recheck Edinburgh score. RN call back [phone] Shawna Orleans, RN

## 2017-11-08 MED ORDER — LISINOPRIL 5 MG PO TABS: 5.0000 mg | ORAL_TABLET | Freq: Every day | ORAL | 1 refills | Status: DC

## 2017-11-08 MED ORDER — SERTRALINE HCL 50 MG PO TABS: 50.0000 mg | ORAL_TABLET | Freq: Every day | ORAL | 1 refills | Status: DC

## 2017-11-08 NOTE — Telephone Encounter (Signed)
Called patient regarding BP and request for Zoloft refill.  Regarding BP, patient had appt two days ago for BP check after elevated BPs at [facility]. Patient did not show for appt. Received message from [facility] RN with elevated BP measurement for patient taken yesterday. Discussed with patient that given repeatedly elevated BP, she needs to begin an anti-hypertensive. Confirmed that patient is NOT breastfeeding, so will start lisinopril. Discussed importance of taking medication daily and of following up in clinic so we can ensure her BP is under control. Patient said she would call front desk to schedule appt today.  Regarding Zoloft, confirmed with patient that she never began Zoloft during pregnancy. She confirmed that she did not, but said that she would like to begin it now. Patient says she did pick up prescription, but has lost it, and as such needs refill. Will send in refill, but stressed importance of scheduling clinic appt for this issue as well.   Tarri Abernethy, MD, MPH PGY-3 [facility] Pager [phone]

## 2017-11-13 ENCOUNTER — Other Ambulatory Visit: Payer: Self-pay

## 2017-11-13 ENCOUNTER — Ambulatory Visit (INDEPENDENT_AMBULATORY_CARE_PROVIDER_SITE_OTHER): Payer: Medicaid Other | Admitting: Internal Medicine

## 2017-11-13 ENCOUNTER — Encounter: Payer: Self-pay | Admitting: Internal Medicine

## 2017-11-13 DIAGNOSIS — O139 Gestational [pregnancy-induced] hypertension without significant proteinuria, unspecified trimester: Secondary | ICD-10-CM

## 2017-11-13 DIAGNOSIS — F329 Major depressive disorder, single episode, unspecified: Secondary | ICD-10-CM | POA: Diagnosis not present

## 2017-11-13 DIAGNOSIS — F32A Depression, unspecified: Secondary | ICD-10-CM | POA: Insufficient documentation

## 2017-11-13 NOTE — Patient Instructions (Addendum)
It was nice seeing you again today Natasha Smith!  Please continue taking Zoloft as you have been. If you begin to feel sadder or notice that you are crying more frequently, or if you have thoughts of hurting yourself or anyone else, please call our clinic or 911, or go to the emergency room right away.   Please continue taking lisinopril for your blood pressure.   For pain, you can take up to 800 mg ibuprofen every 6-8 hours as needed.   I will see you back in one month to see how you are doing.   If you have any questions or concerns, please feel free to call the clinic.   Be well,  Dr. Natale Milch

## 2017-11-13 NOTE — Progress Notes (Signed)
   Subjective:   Patient: Natasha Smith       Birthdate: Oct 14, 1990       MRN: [id number]      HPI  Natasha Smith is a 27 y.o. female presenting for post-partum f/u.   Patient says she is generally doing well today. Is still having pain at incision site from CS, and is requesting refill of Percocet today. Has not been taking anything other than this for pain. Says pain is worse at night. No bleeding from incision site. Endorses some vaginal bleeding still, but says this is getting lighter. Appetite normal. No issues with defecation or urination.  Regarding mood, patient says she still feels quite moody. Started taking Zoloft last week. Says that she is crying rather frequently, about every other night. Says she usually cries because she is "aggravated" about life stressors. Denies SI/HI.  Patient also noted to have elevated BP after delivery and when [facility] RN came to house. Started on lisinopril last week. Has taken today.   Smoking status reviewed. Patient is former smoker.   Review of Systems See HPI.     Objective:  Physical Exam  Constitutional: She is oriented to person, place, and time. She appears well-developed and well-nourished. No distress.  HENT:  Head: Normocephalic and atraumatic.  Cardiovascular: Normal rate.  Pulmonary/Chest: Effort normal. No respiratory distress.  Neurological: She is alert and oriented to person, place, and time.  Psychiatric: She has a normal mood and affect. Her behavior is normal.   Assessment & Plan:  Gestational hypertension BP improved and at goal today after beginning lisinopril. Will continue lisinopril for now. If remains at goal at subsequent appointments, could consider trial off lisinopril.  - F/u in one month  Depression Beginning prior to pregnancy. Continued during pregnancy, however patient refused Zoloft. Has resumed Zoloft post-partum. Says that mood has significantly improved yet, however only started last week. Denies SI/HI. As  already started Zoloft, will continue with current dose until f/u in one month to give med time to take effect. If no improvement in mood at f/u, can increase dose or switch agents. Strict return precautions discussed.   Natasha Abernethy, MD, MPH PGY-3 [facility] Pager [phone]

## 2017-11-13 NOTE — Assessment & Plan Note (Signed)
Beginning prior to pregnancy. Continued during pregnancy, however patient refused Zoloft. Has resumed Zoloft post-partum. Says that mood has significantly improved yet, however only started last week. Denies SI/HI. As already started Zoloft, will continue with current dose until f/u in one month to give med time to take effect. If no improvement in mood at f/u, can increase dose or switch agents. Strict return precautions discussed.

## 2017-11-13 NOTE — Assessment & Plan Note (Signed)
BP improved and at goal today after beginning lisinopril. Will continue lisinopril for now. If remains at goal at subsequent appointments, could consider trial off lisinopril.  - F/u in one month

## 2018-04-25 ENCOUNTER — Emergency Department (HOSPITAL_COMMUNITY)
Admission: EM | Admit: 2018-04-25 | Discharge: 2018-04-25 | Disposition: A | Discharge status: Home / Self Care | Payer: Medicaid Other | Attending: Emergency Medicine | Admitting: Emergency Medicine

## 2018-04-25 ENCOUNTER — Emergency Department (HOSPITAL_COMMUNITY): Payer: Medicaid Other

## 2018-04-25 ENCOUNTER — Encounter (HOSPITAL_COMMUNITY): Payer: Self-pay | Admitting: Emergency Medicine

## 2018-04-25 ENCOUNTER — Other Ambulatory Visit: Payer: Self-pay

## 2018-04-25 DIAGNOSIS — R079 Chest pain, unspecified: Secondary | ICD-10-CM | POA: Diagnosis not present

## 2018-04-25 DIAGNOSIS — Z79899 Other long term (current) drug therapy: Secondary | ICD-10-CM | POA: Diagnosis not present

## 2018-04-25 DIAGNOSIS — Z87891 Personal history of nicotine dependence: Secondary | ICD-10-CM | POA: Diagnosis not present

## 2018-04-25 DIAGNOSIS — R55 Syncope and collapse: Secondary | ICD-10-CM | POA: Diagnosis not present

## 2018-04-25 DIAGNOSIS — N3001 Acute cystitis with hematuria: Secondary | ICD-10-CM | POA: Insufficient documentation

## 2018-04-25 DIAGNOSIS — H538 Other visual disturbances: Secondary | ICD-10-CM | POA: Insufficient documentation

## 2018-04-25 DIAGNOSIS — R51 Headache: Secondary | ICD-10-CM | POA: Diagnosis present

## 2018-04-25 DIAGNOSIS — N390 Urinary tract infection, site not specified: Secondary | ICD-10-CM

## 2018-04-25 DIAGNOSIS — R0602 Shortness of breath: Secondary | ICD-10-CM | POA: Diagnosis not present

## 2018-04-25 DIAGNOSIS — I10 Essential (primary) hypertension: Secondary | ICD-10-CM | POA: Diagnosis not present

## 2018-04-25 LAB — BASIC METABOLIC PANEL
Anion gap: 12 (ref 5–15)
BUN: 8 mg/dL (ref 6–20)
CALCIUM: 10 mg/dL (ref 8.9–10.3)
CO2: 24 mmol/L (ref 22–32)
CREATININE: 0.92 mg/dL (ref 0.44–1.00)
Chloride: 105 mmol/L (ref 98–111)
GFR calc Af Amer: >60 mL/min (ref >60)
GFR calc non Af Amer: >60 mL/min (ref >60)
GLUCOSE: 91 mg/dL (ref 70–99)
Potassium: 3.2 mmol/L — ABNORMAL LOW (ref 3.5–5.1)
Sodium: 141 mmol/L (ref 135–145)

## 2018-04-25 LAB — URINALYSIS, ROUTINE W REFLEX MICROSCOPIC
Bilirubin Urine: NEGATIVE
Glucose, UA: NEGATIVE mg/dL
KETONES UR: 5 mg/dL — AB
Nitrite: NEGATIVE
PROTEIN: 30 mg/dL — AB
Specific Gravity, Urine: 1.011 (ref 1.005–1.030)
WBC, UA: >50 WBC/hpf — ABNORMAL HIGH (ref 0–5)
pH: 6 (ref 5.0–8.0)

## 2018-04-25 LAB — CBC
HCT: 36.8 % (ref 36.0–46.0)
Hemoglobin: 10.9 g/dL — ABNORMAL LOW (ref 12.0–15.0)
MCH: 19.5 pg — AB (ref 26.0–34.0)
MCHC: 29.6 g/dL — ABNORMAL LOW (ref 30.0–36.0)
MCV: 65.7 fL — AB (ref 80.0–100.0)
Platelets: 251 10*3/uL (ref 150–400)
RBC: 5.6 MIL/uL — AB (ref 3.87–5.11)
RDW: 18.8 % — AB (ref 11.5–15.5)
WBC: 16.4 10*3/uL — ABNORMAL HIGH (ref 4.0–10.5)
nRBC: 0 % (ref 0.0–0.2)

## 2018-04-25 LAB — I-STAT BETA HCG BLOOD, ED (MC, WL, AP ONLY)

## 2018-04-25 LAB — HEPATIC FUNCTION PANEL
ALK PHOS: 85 U/L (ref 38–126)
ALT: 16 U/L (ref 0–44)
AST: 17 U/L (ref 15–41)
Albumin: 4 g/dL (ref 3.5–5.0)
BILIRUBIN DIRECT: 0.3 mg/dL — AB (ref 0.0–0.2)
BILIRUBIN INDIRECT: 0.9 mg/dL (ref 0.3–0.9)
BILIRUBIN TOTAL: 1.2 mg/dL (ref 0.3–1.2)
TOTAL PROTEIN: 8.1 g/dL (ref 6.5–8.1)

## 2018-04-25 LAB — I-STAT TROPONIN, ED
Troponin i, poc: 0 ng/mL (ref 0.00–0.08)
Troponin i, poc: 0 ng/mL (ref 0.00–0.08)

## 2018-04-25 LAB — I-STAT CG4 LACTIC ACID, ED
Lactic Acid, Venous: 1.36 mmol/L (ref 0.5–1.9)
Lactic Acid, Venous: 0.85 mmol/L (ref 0.5–1.9)

## 2018-04-25 LAB — D-DIMER, QUANTITATIVE (NOT AT ARMC): D DIMER QUANT: 1.02 ug{FEU}/mL — AB (ref 0.00–0.50)

## 2018-04-25 MED ORDER — IOPAMIDOL (ISOVUE-370) INJECTION 76%
100.0000 mL | Freq: Once | INTRAVENOUS | Status: AC | PRN
  Administered 2018-04-25: 80 mL via INTRAVENOUS

## 2018-04-25 MED ORDER — SODIUM CHLORIDE 0.9 % IV BOLUS
1000.0000 mL | Freq: Once | INTRAVENOUS | Status: AC
  Administered 2018-04-25: 1000 mL via INTRAVENOUS

## 2018-04-25 MED ORDER — SODIUM CHLORIDE 0.9 % IJ SOLN
INTRAMUSCULAR | Status: AC
  Filled 2018-04-25: qty 50

## 2018-04-25 MED ORDER — ONDANSETRON HCL 4 MG/2ML IJ SOLN
4.0000 mg | Freq: Once | INTRAMUSCULAR | Status: AC
  Administered 2018-04-25: 4 mg via INTRAVENOUS
  Filled 2018-04-25: qty 2

## 2018-04-25 MED ORDER — POTASSIUM CHLORIDE CRYS ER 20 MEQ PO TBCR
40.0000 meq | EXTENDED_RELEASE_TABLET | Freq: Once | ORAL | Status: AC
  Administered 2018-04-25: 40 meq via ORAL
  Filled 2018-04-25: qty 2

## 2018-04-25 MED ORDER — IOPAMIDOL (ISOVUE-370) INJECTION 76%
INTRAVENOUS | Status: AC
  Filled 2018-04-25: qty 100

## 2018-04-25 MED ORDER — CEPHALEXIN 500 MG PO CAPS: 500.0000 mg | ORAL_CAPSULE | Freq: Two times a day (BID) | ORAL | 0 refills | Status: AC

## 2018-04-25 MED ORDER — SODIUM CHLORIDE 0.9 % IV SOLN
1.0000 g | Freq: Once | INTRAVENOUS | Status: AC
  Administered 2018-04-25: 1 g via INTRAVENOUS
  Filled 2018-04-25: qty 10

## 2018-04-25 MED ORDER — ACETAMINOPHEN 500 MG PO TABS
1000.0000 mg | ORAL_TABLET | Freq: Once | ORAL | Status: AC
  Administered 2018-04-25: 1000 mg via ORAL
  Filled 2018-04-25: qty 2

## 2018-04-25 MED ORDER — KETOROLAC TROMETHAMINE 15 MG/ML IJ SOLN
15.0000 mg | Freq: Once | INTRAMUSCULAR | Status: AC
  Administered 2018-04-25: 15 mg via INTRAVENOUS
  Filled 2018-04-25: qty 1

## 2018-04-25 MED ORDER — IOPAMIDOL (ISOVUE-370) INJECTION 76%
100.0000 mL | Freq: Once | INTRAVENOUS | Status: AC | PRN
  Administered 2018-04-25: 100 mL via INTRAVENOUS

## 2018-04-25 MED ORDER — SODIUM CHLORIDE 0.9 % IV SOLN
Freq: Once | INTRAVENOUS | Status: AC
  Administered 2018-04-25: 18:00:00 via INTRAVENOUS

## 2018-04-25 NOTE — ED Notes (Signed)
Repeat EKG given to [facility] for review.

## 2018-04-25 NOTE — ED Provider Notes (Signed)
[facility]-EMERGENCY DEPT Provider Note   CSN: [id number] Arrival date & time: 04/25/18  1604     History   Chief Complaint Chief Complaint  Patient presents with  . Headache    HPI Natasha Smith is a 27 y.o. female with history of sickle cell trait and migraine headaches presenting with 3-day history of worsening frontal headache, rates the pain at 10/10 with associated photophobia, blurry vision in both eyes and nausea.  She indicates that the pain is sharp in nature with no radiation.   She also reports of a syncopal episode yesterday that happened in the shower.  Prior to the episode she felt dizzy, experienced palpitation, substernal chest pain and shortness of breath.  She also gives a history of diaphoresis during this episode.  She also reports of a 1 day history of back pain located from the mid thorax to the lumbar area which has been worsening.  She has tried ice packs and ibuprofen with no relief.  She denies any fevers but reports of subjective chills.   Past Medical History:  Diagnosis Date  . Abnormal Pap smear   . Headache   . Herpes simplex type II infection   . History of chlamydia infection   . Infection    UTI  . Pregnancy induced hypertension    hx  . Sickle cell trait [facility])     Patient Active Problem List   Diagnosis Date Noted  . Depression 11/13/2017  . Labor and delivery indication for care or intervention 10/28/2017  . Gestational hypertension 10/28/2017  . Elevated BP without diagnosis of hypertension 10/13/2017  . Gestational thrombocytopenia (HCC) 09/03/2017  . Sickle cell trait (HCC) 04/16/2014  . 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 OBESITY 03/03/2008  . ECZEMA, ATOPIC DERMATITIS 09/07/2006    Past Surgical History:  Procedure Laterality Date  . KNEE SURGERY    . TUBAL LIGATION Bilateral 10/28/2017   Procedure: POST PARTUM TUBAL LIGATION;  Surgeon: Levie Heritage, DO;  Location: [facility];  Service: Gynecology;  Laterality: Bilateral;     OB History    Gravida  9   Para  5   Term  5   Preterm  0   AB  4   Living  5     SAB  3   TAB  1   Ectopic  0   Multiple  0   Live Births  5            Home Medications    Prior to Admission medications   Medication Sig Start Date End Date Taking? Authorizing Provider  cephALEXin (KEFLEX) 500 MG capsule Take 1 capsule (500 mg total) by mouth 2 (two) times daily for 7 days. 04/25/18 05/02/18  Yvette Rack, MD  lisinopril (PRINIVIL,ZESTRIL) 5 MG tablet Take 1 tablet (5 mg total) by mouth daily. Patient not taking: Reported on 04/25/2018 11/08/17   Marquette Saa, MD  sertraline (ZOLOFT) 50 MG tablet Take 1 tablet (50 mg total) by mouth daily. Patient not taking: Reported on 04/25/2018 11/08/17   Marquette Saa, MD    Family History Family History  Problem Relation Age of Onset  . Diabetes Maternal Grandmother   . Hypertension Mother   . Asthma Mother   . Heart disease Maternal Grandfather   . Asthma Sister   . Asthma Brother   . Cancer Maternal Aunt     Social History Social History   Tobacco Use  .  Smoking status: Former Games developer  . Smokeless tobacco: Never Used  . Tobacco comment: March 2015  Substance Use Topics  . Alcohol use: No  . Drug use: No     Allergies   Patient has no known allergies.   Review of Systems Review of Systems   Physical Exam Updated Vital Signs BP 129/66 (BP Location: Left Arm)   Pulse 97   Temp 98.2 F (36.8 C) (Oral)   Resp 12   SpO2 100%   Physical Exam   ED Treatments / Results  Labs (all labs ordered are listed, but only abnormal results are displayed) Labs Reviewed  BASIC METABOLIC PANEL - Abnormal; Notable for the following components:      Result Value   Potassium 3.2 (*)    All other components within normal limits  CBC - Abnormal; Notable for the following components:   WBC 16.4 (*)    RBC 5.60 (*)     Hemoglobin 10.9 (*)    MCV 65.7 (*)    MCH 19.5 (*)    MCHC 29.6 (*)    RDW 18.8 (*)    All other components within normal limits  URINALYSIS, ROUTINE W REFLEX MICROSCOPIC - Abnormal; Notable for the following components:   APPearance CLOUDY (*)    Hgb urine dipstick MODERATE (*)    Ketones, ur 5 (*)    Protein, ur 30 (*)    Leukocytes, UA LARGE (*)    WBC, UA >50 (*)    Bacteria, UA MANY (*)    All other components within normal limits  HEPATIC FUNCTION PANEL - Abnormal; Notable for the following components:   Bilirubin, Direct 0.3 (*)    All other components within normal limits  D-DIMER, QUANTITATIVE (NOT AT [facility]) - Abnormal; Notable for the following components:   D-Dimer, Quant 1.02 (*)    All other components within normal limits  I-STAT TROPONIN, ED  I-STAT BETA HCG BLOOD, ED ([facility], [facility], [facility] ONLY)  I-STAT CG4 LACTIC ACID, ED  I-STAT CG4 LACTIC ACID, ED  I-STAT TROPONIN, ED    EKG EKG Interpretation  Date/Time:  Wednesday April 25 2018 16:58:49 EDT Ventricular Rate:  117 PR Interval:    QRS Duration: 90 QT Interval:  296 QTC Calculation: 413 R Axis:   79 Text Interpretation:  Sinus tachycardia Biatrial enlargement Repol abnrm suggests ischemia, anterolateral Confirmed by Tilden Fossa [id number]) on 04/25/2018 5:45:01 PM   Radiology Ct Head Wo Contrast  Result Date: 04/25/2018 CLINICAL DATA:  Syncopal episode yesterday EXAM: CT HEAD WITHOUT CONTRAST TECHNIQUE: Contiguous axial images were obtained from the base of the skull through the vertex without intravenous contrast. COMPARISON:  06/13/2010 FINDINGS: BRAIN: The ventricles and sulci are normal. No intraparenchymal hemorrhage, mass effect nor midline shift. No acute large vascular territory infarcts. Grey-white matter distinction is maintained. The basal ganglia are unremarkable. No abnormal extra-axial fluid collections. Basal cisterns are not effaced and midline. The brainstem and cerebellar hemispheres are without  acute abnormalities. VASCULAR: Unremarkable. SKULL/SOFT TISSUES: No skull fracture. No significant soft tissue swelling. ORBITS/SINUSES: The included ocular globes and orbital contents are normal.The mastoid air cells are clear. The included paranasal sinuses are well-aerated. OTHER: None. IMPRESSION: Normal head CT Electronically Signed   By: Tollie Eth M.D.   On: 04/25/2018 19:04   Ct Angio Chest Pe W And/or Wo Contrast  Result Date: 04/25/2018 CLINICAL DATA:  Headache with bilateral blurred vision and back pain for 3 days. Shortness of breath. EXAM: CT ANGIOGRAPHY CHEST WITH  CONTRAST TECHNIQUE: Multidetector CT imaging of the chest was performed using the standard protocol during bolus administration of intravenous contrast. Multiplanar CT image reconstructions and MIPs were obtained to evaluate the vascular anatomy. CONTRAST:  ISOVUE-370 IOPAMIDOL (ISOVUE-370) INJECTION 76%, 80mL ISOVUE-370 IOPAMIDOL (ISOVUE-370) INJECTION 76% COMPARISON:  None. FINDINGS: Cardiovascular: Initial contrast injection was suboptimal and the patient was reinjected period reinjection images demonstrate good opacification of the central and segmental pulmonary arteries. No focal filling defects are demonstrated. No evidence of significant pulmonary embolus. Normal caliber thoracic aorta. No aortic dissection. Normal heart size. No pericardial effusion. Mediastinum/Nodes: Esophagus is decompressed. No significant lymphadenopathy in the chest. Residual thymic tissue in the anterior mediastinum. Lungs/Pleura: Tiny right apical bleb. Lungs are otherwise clear and expanded. No consolidation or edema. No pleural effusions. No pneumothorax. Upper Abdomen: No acute abnormalities demonstrated in the visualized upper abdomen. Musculoskeletal: No chest wall abnormality. No acute or significant osseous findings. Review of the MIP images confirms the above findings. IMPRESSION: No evidence of significant pulmonary embolus. No evidence  of active pulmonary disease. Electronically Signed   By: Burman Nieves M.D.   On: 04/25/2018 21:07   Dg Chest Portable 1 View  Result Date: 04/25/2018 CLINICAL DATA:  Chest pain EXAM: PORTABLE CHEST 1 VIEW COMPARISON:  11/20/2008 FINDINGS: The heart size and mediastinal contours are within normal limits. Both lungs are clear. The visualized skeletal structures are unremarkable. IMPRESSION: No active disease. Electronically Signed   By: Tollie Eth M.D.   On: 04/25/2018 19:04    Procedures Procedures (including critical care time)  Medications Ordered in ED Medications  iopamidol (ISOVUE-370) 76 % injection (has no administration in time range)  sodium chloride 0.9 % injection (has no administration in time range)  iopamidol (ISOVUE-370) 76 % injection (has no administration in time range)  sodium chloride 0.9 % injection (has no administration in time range)  ondansetron (ZOFRAN) injection 4 mg (4 mg Intravenous Given 04/25/18 1828)  0.9 %  sodium chloride infusion ( Intravenous New Bag/Given 04/25/18 1827)  acetaminophen (TYLENOL) tablet 1,000 mg (1,000 mg Oral Given 04/25/18 1828)  potassium chloride SA (K-DUR,KLOR-CON) CR tablet 40 mEq (40 mEq Oral Given 04/25/18 1828)  sodium chloride 0.9 % bolus 1,000 mL (0 mLs Intravenous Stopped 04/25/18 2144)  iopamidol (ISOVUE-370) 76 % injection 100 mL (100 mLs Intravenous Contrast Given 04/25/18 2023)  iopamidol (ISOVUE-370) 76 % injection 100 mL (80 mLs Intravenous Contrast Given 04/25/18 2032)  cefTRIAXone (ROCEPHIN) 1 g in sodium chloride 0.9 % 100 mL IVPB (0 g Intravenous Stopped 04/25/18 2301)  ketorolac (TORADOL) 15 MG/ML injection 15 mg (15 mg Intravenous Given 04/25/18 2204)     Initial Impression / Assessment and Plan / ED Course  I have reviewed the triage vital signs and the nursing notes.  Pertinent labs & imaging results that were available during my care of the patient were reviewed by me and considered in my medical  decision making (see chart for details).   27 year old woman with history of migraine presenting with multiple complaints.  1.  Complains of a 3-day history of worsening frontal headache with associated photophobia, blurry vision in both eyes and nausea. CT brain was unremarkable. She received Zofran, Tylenol and maintenance normal saline as well as Toradol. On reevaluation, she reported of significant improvement to her headaches  2.  Reports of a syncopal episode in the shower yesterday that was preceded by dizziness, substernal chest pain, palpitation.  She had an abnormal EKG which showed sinus tachycardia, by atrial  enlargement, ST depression in anterior lateral leads suggesting ischemia repeat EKG still showed T wave abnormalities however this was also discovered on old EKGs. In addition, patient did not report of chest pain, chest tightness, palpitation, diaphoresis, SOB, nausea or vomiting. Stat troponin was normal.  Repeat troponin was unremarkable.  Urinalysis showed >50 CBC, large leukocytes, bacteria and moderate hemoglobin for which she was started on ceftriaxone.   Labs are as follows: CBC revealed leukocytosis 16.4 however she was noted to have leukocytosis of 15.8 about 5 months ago during delivery of term pregnancy, BMP showed hypokalemia of 3.2,  i-STAT troponin was negative, LFTs were essentially unremarkable, i-STAT beta-hCG unremarkable.  D-dimer was measured at 1.02 and follow-up CT angiography did not show any evidence of pulmonary embolism.  She is safe for discharge and will be discharged on Keflex 500mg  BID for 7 days as tachycardia had resolved and she appeared comfortable.   Final Clinical Impressions(s) / ED Diagnoses   Final diagnoses:  Lower urinary tract infectious disease  Acute cystitis with hematuria    ED Discharge Orders         Ordered    cephALEXin (KEFLEX) 500 MG capsule  2 times daily     04/25/18 2306           Yvette Rack, MD 04/25/18 1610     Tilden Fossa, MD 04/29/18 9604    Tilden Fossa, MD 05/01/18 [id number]

## 2018-04-25 NOTE — ED Triage Notes (Addendum)
Patient c/o headache with bilateral blurred vision and back pain x3 days. Denies N/V. Reports light sensitivity. Denies weakness.

## 2018-04-25 NOTE — ED Notes (Signed)
Pt requested something else for pain. Resident, Obed made aware.

## 2018-04-25 NOTE — Discharge Instructions (Signed)
Natasha Smith,   You were found to have a urinary tract infection and I am discharging you with an antibiotic called Keflex which you will take twice a day and 7 days.   ~Take Care

## 2018-08-04 NOTE — Progress Notes (Deleted)
   Subjective:    Patient ID: Natasha Smith, female    DOB: [birth year], 28 y.o.   MRN: [id number]   CC:  HPI:  Abnormal Uterine Bleeding Labs: Upreg, CBC, *Screening for bleeding disorder, TSH, GC/Chlamydia (high risk)  Imaging: TVUS, MRI, sonohysterography, hysteroscopy  Endometrial Bx: Do it in clinic that day Treatment: NSAIDs, TXA, Hormonal (IUD, cyclic progestin-Megase 40bid x2 week>daily until f/u 1 month, leuprolide)  Smoking status reviewed  Review of Systems   Objective:  There were no vitals taken for this visit. Vitals and nursing note reviewed  General: well nourished, in no acute distress HEENT: normocephalic, TM's visualized bilaterally, no scleral icterus or conjunctival pallor, no nasal discharge, moist mucous membranes, good dentition without erythema or discharge noted in posterior oropharynx Neck: supple, non-tender, without lymphadenopathy Cardiac: RRR, clear S1 and S2, no murmurs, rubs, or gallops Respiratory: clear to auscultation bilaterally, no increased work of breathing Abdomen: soft, nontender, nondistended, no masses or organomegaly. Bowel sounds present Extremities: no edema or cyanosis. Warm, well perfused. 2+ radial and PT pulses bilaterally Skin: warm and dry, no rashes noted Neuro: alert and oriented, no focal deficits   Assessment & Plan:    No problem-specific Assessment & Plan notes found for this encounter.    No follow-ups on file.   [name], DO, PGY-2

## 2018-08-09 ENCOUNTER — Ambulatory Visit: Payer: Medicaid Other | Admitting: Family Medicine

## 2018-08-13 ENCOUNTER — Ambulatory Visit: Payer: Medicaid Other | Admitting: Family Medicine

## 2018-08-16 ENCOUNTER — Ambulatory Visit: Payer: Self-pay

## 2019-01-22 ENCOUNTER — Other Ambulatory Visit (HOSPITAL_COMMUNITY)
Admission: RE | Admit: 2019-01-22 | Discharge: 2019-01-22 | Disposition: A | Discharge status: Home / Self Care | Payer: Self-pay | Source: Ambulatory Visit | Attending: Family Medicine | Admitting: Family Medicine

## 2019-01-22 ENCOUNTER — Ambulatory Visit (INDEPENDENT_AMBULATORY_CARE_PROVIDER_SITE_OTHER): Payer: Self-pay | Admitting: Family Medicine

## 2019-01-22 ENCOUNTER — Other Ambulatory Visit: Payer: Self-pay

## 2019-01-22 VITALS — BP 130/80 | HR 91

## 2019-01-22 DIAGNOSIS — Z202 Contact with and (suspected) exposure to infections with a predominantly sexual mode of transmission: Secondary | ICD-10-CM

## 2019-01-22 DIAGNOSIS — A599 Trichomoniasis, unspecified: Secondary | ICD-10-CM

## 2019-01-22 DIAGNOSIS — Z113 Encounter for screening for infections with a predominantly sexual mode of transmission: Secondary | ICD-10-CM

## 2019-01-22 DIAGNOSIS — B009 Herpesviral infection, unspecified: Secondary | ICD-10-CM

## 2019-01-22 DIAGNOSIS — N939 Abnormal uterine and vaginal bleeding, unspecified: Secondary | ICD-10-CM | POA: Insufficient documentation

## 2019-01-22 HISTORY — DX: Trichomoniasis, unspecified: A59.9

## 2019-01-22 HISTORY — DX: Contact with and (suspected) exposure to infections with a predominantly sexual mode of transmission: Z20.2

## 2019-01-22 LAB — POCT WET PREP (WET MOUNT): Clue Cells Wet Prep Whiff POC: NEGATIVE

## 2019-01-22 LAB — POCT HEMOGLOBIN: Hemoglobin: 10.7 g/dL — AB (ref 11–14.6)

## 2019-01-22 MED ORDER — METRONIDAZOLE 500 MG PO TABS: 2000.0000 mg | ORAL_TABLET | Freq: Once | ORAL | 0 refills | Status: AC

## 2019-01-22 MED ORDER — MEDROXYPROGESTERONE ACETATE 10 MG PO TABS: 10.0000 mg | ORAL_TABLET | Freq: Every day | ORAL | 0 refills | Status: DC

## 2019-01-22 MED ORDER — VALACYCLOVIR HCL 1 G PO TABS: 1000.0000 mg | ORAL_TABLET | Freq: Every day | ORAL | 1 refills | Status: AC

## 2019-01-22 NOTE — Assessment & Plan Note (Signed)
Refilled Valtrex for as needed use

## 2019-01-22 NOTE — Assessment & Plan Note (Signed)
Patient with likely STD exposure.  Difficult to assess vaginal discharge as patient is heavily bleeding at this time.  Will obtain wet prep, gonorrhea and chlamydia, was hopeful to obtain RPR and HIV however due to financial burden will not obtain these at this time.  Once insurance is reinstated will obtain RPR, HIV, and hepatitis C testing.  Wet prep did show trichomonas so 2 g metronidazole was sent to pharmacy.  Strict return precautions given.  Advised that this all sexual partners also be treated and to not have intercourse until her partners are treated.

## 2019-01-22 NOTE — Patient Instructions (Signed)
It was a pleasure seeing you today.   Today we discussed your bleeding  For your bleeding: I have gotten labs and a pelvic US. Please follow up in 2 weeks. Take provera 10mg  daily x2 weeks.   For your STD check: I will call/send a letter with results. I have sent in a refill for valacyclovir.   Please follow up in 2 weeks or sooner if symptoms persist or worsen. Please call the clinic immediately if you have any concerns.   Our clinic's number is [phone]. Please call with questions or concerns.   Please go to the emergency room if you have further black out spells, continued dizziness, or shortness of breath   Thank you,  Caroline More, DO

## 2019-01-22 NOTE — Progress Notes (Addendum)
Subjective:    Patient ID: Natasha Smith, female    DOB: [birth year], 28 y.o.   MRN: [id number]   CC: Blackout spells, STD check  HPI: Black out spells Patient reports "blackout spells".  These occur occasionally but only around her period.  Normally sees spots then gets dizzy and then loses consciousness.  Does have migraines associated with her period as well.  Does note that she has very heavy periods.  Has had a tubal ligation in April and a couple months after that she started having this heavy period.  Periods last for full 7 days and occur monthly.  Prior to her tubal ligation she was not on any birth control.  Was on Depakote and NuvaRing for several years in the past.  Patient is G5, P5.  STD testing Patient desires to have STD testing.  Recently became sexually active with the father of her child.  States that he told her she may have to get STD testing so she thinks he has an STD.  Does note white discharge.  Denies any bumps or reactivation of herpes.  Denies any pain.  Objective:  BP 130/80   Pulse 91   SpO2 100%  Vitals and nursing note reviewed  General: well nourished, in no acute distress HEENT: normocephalic, no scleral icterus or conjunctival pallor, no nasal discharge, moist mucous membranes Cardiac: RRR, clear S1 and S2, no murmurs, rubs, or gallops Respiratory: clear to auscultation bilaterally, no increased work of breathing Abdomen: soft, nontender, nondistended, no masses or organomegaly. Bowel sounds present Extremities: no edema or cyanosis. Warm, well perfused.  Skin: warm and dry, no rashes noted Neuro: alert and oriented, no focal deficits Female genitalia: normal external genitalia, vulva, vagina, cervix, uterus and adnexa. Bleeding noted so difficult to assess cervical characteristic or discharge   Assessment & Plan:    Abnormal uterine bleeding (AUB) Unclear etiology at this time.  Patient has tubal ligation so unlikely pregnancy.  Plan initially was  to obtain CBC, TSH, urine pregnancy, anemia panel, as well as pelvic ultrasound.  However patient does not have any insurance and this will be a financial burden on her.  Will obtain a hemoglobin to ensure no severe anemia.  Pelvic ultrasound will be deferred until patient can get either orange card or Medicaid reinstated.  We will also wait to do further lab work including including TSH, anemia panel, and pelvic ultrasound.  Will prescribe Provera 10 mg daily x2 weeks to help stop current bleeding.  Strict return precautions given.  Hemoglobin appeared stable from previous.  Advised to take over-the-counter multivitamin with iron.  Follow-up in 2 weeks.  STD exposure Patient with likely STD exposure.  Difficult to assess vaginal discharge as patient is heavily bleeding at this time.  Will obtain wet prep, gonorrhea and chlamydia, was hopeful to obtain RPR and HIV however due to financial burden will not obtain these at this time.  Once insurance is reinstated will obtain RPR, HIV, and hepatitis C testing.  Wet prep did show trichomonas so 2 g metronidazole was sent to pharmacy.  Strict return precautions given.  Advised that this all sexual partners also be treated and to not have intercourse until her partners are treated.  Trichomoniasis Wet prep did show trichomonas so 2 g metronidazole was sent to pharmacy.  Strict return precautions given.  Advised that this all sexual partners also be treated and to not have intercourse until her partners are treated.  HSV-2 (herpes simplex virus 2) infection  Refilled Valtrex for as needed use  Discussed with Dr. Lum BabeEniola   Return in about 2 weeks (around 02/05/2019).   Oralia ManisSherin Taylinn Brabant, DO, PGY-3

## 2019-01-22 NOTE — Assessment & Plan Note (Signed)
Unclear etiology at this time.  Patient has tubal ligation so unlikely pregnancy.  Plan initially was to obtain CBC, TSH, urine pregnancy, anemia panel, as well as pelvic ultrasound.  However patient does not have any insurance and this will be a financial burden on her.  Will obtain a hemoglobin to ensure no severe anemia.  Pelvic ultrasound will be deferred until patient can get either orange card or Medicaid reinstated.  We will also wait to do further lab work including including TSH, anemia panel, and pelvic ultrasound.  Will prescribe Provera 10 mg daily x2 weeks to help stop current bleeding.  Strict return precautions given.  Hemoglobin appeared stable from previous.  Advised to take over-the-counter multivitamin with iron.  Follow-up in 2 weeks.

## 2019-01-22 NOTE — Assessment & Plan Note (Signed)
Wet prep did show trichomonas so 2 g metronidazole was sent to pharmacy.  Strict return precautions given.  Advised that this all sexual partners also be treated and to not have intercourse until her partners are treated.

## 2019-01-24 LAB — CERVICOVAGINAL ANCILLARY ONLY
Chlamydia: POSITIVE — AB
Neisseria Gonorrhea: NEGATIVE

## 2019-01-25 ENCOUNTER — Encounter: Payer: Self-pay | Admitting: Family Medicine

## 2019-01-25 ENCOUNTER — Telehealth: Payer: Self-pay | Admitting: Family Medicine

## 2019-01-25 DIAGNOSIS — A749 Chlamydial infection, unspecified: Secondary | ICD-10-CM

## 2019-01-25 MED ORDER — AZITHROMYCIN 500 MG PO TABS
1000.0000 mg | ORAL_TABLET | Freq: Once | ORAL | Status: AC
  Administered 2019-01-30: 1000 mg via ORAL

## 2019-01-25 NOTE — Telephone Encounter (Signed)
Called patient to inform her of appointment with nurse clinic.  Patient did not answer.  Left message for patient to call office to get appointment.  Ozella Almond, Ronald

## 2019-01-25 NOTE — Telephone Encounter (Signed)
Attempted to call patient x3 with no response. Patient with positive chlamydia test. Will need to be treated as well as all sexual partners need treatment. Nursing appointment made for 01/29/19 @ 10:30AM for treatment. I will place orders for RN visit. I will also send a mychart message informing patient of this.   RN team: When patient arrives please also ensure that her partners be treated and to not have intercourse until all partners have had treatment.   Red team: Please call patient and inform her of this appointment date and time. I am happy to answer any questions she may have, she may mychart message me if this is more convenient for her.   Dalphine Handing, PGY-3 [facility] 01/25/2019 9:09 AM

## 2019-01-29 ENCOUNTER — Ambulatory Visit: Payer: Self-pay

## 2019-01-30 ENCOUNTER — Other Ambulatory Visit: Payer: Self-pay

## 2019-01-30 ENCOUNTER — Ambulatory Visit (INDEPENDENT_AMBULATORY_CARE_PROVIDER_SITE_OTHER): Payer: Self-pay

## 2019-01-30 DIAGNOSIS — A749 Chlamydial infection, unspecified: Secondary | ICD-10-CM

## 2019-01-30 NOTE — Progress Notes (Signed)
Patient presents in nurse clinic for treatment of Chlamydia. 1gram Azithromycin given PO, per Tammi Klippel. Patient tolerated well. Patient informed to abstain from sexual activity for 10 days and to make sure partner has been treated.   Patient informed her treatment for Dalbert Batman is waiting for her at the pharmacy. Instructions for use given.

## 2019-05-20 ENCOUNTER — Other Ambulatory Visit: Payer: Self-pay | Admitting: Family Medicine

## 2019-05-20 DIAGNOSIS — N939 Abnormal uterine and vaginal bleeding, unspecified: Secondary | ICD-10-CM

## 2019-05-20 NOTE — Telephone Encounter (Signed)
This was a 2 week course. Patient was advised to follow up to ensure resolution of AUB. Please schedule patient for follow up visit. Would prefer in person if possible as she may need a Hgb check if she is still bleeding  Caroline More, DO, PGY-3 [facility] 05/20/2019 3:21 PM

## 2019-05-21 NOTE — Telephone Encounter (Signed)
Looks like pt already scheduled an appt to come see you. Deseree Kennon Holter, CMA

## 2019-06-04 NOTE — Progress Notes (Deleted)
   Subjective:    Patient ID: Natasha Smith, female    DOB: [birth year], 28 y.o.   MRN: [id number]   CC:  HPI: AUB Seen on 7/14 for AUB. At that time pelvic US was deffered for lack of insurance. Lab work was also defferred. Hemoglobin was stable at 10.7. Was advised to take OTC MVN with iron.   Smoking status reviewed  Review of Systems   Objective:  There were no vitals taken for this visit. Vitals and nursing note reviewed  General: well nourished, in no acute distress HEENT: normocephalic, TM's visualized bilaterally, no scleral icterus or conjunctival pallor, no nasal discharge, moist mucous membranes, good dentition without erythema or discharge noted in posterior oropharynx Neck: supple, non-tender, without lymphadenopathy Cardiac: RRR, clear S1 and S2, no murmurs, rubs, or gallops Respiratory: clear to auscultation bilaterally, no increased work of breathing Abdomen: soft, nontender, nondistended, no masses or organomegaly. Bowel sounds present Extremities: no edema or cyanosis. Warm, well perfused. 2+ radial and PT pulses bilaterally Skin: warm and dry, no rashes noted Neuro: alert and oriented, no focal deficits   Assessment & Plan:    No problem-specific Assessment & Plan notes found for this encounter.    No follow-ups on file.   Caroline More, DO, PGY-3

## 2019-06-05 ENCOUNTER — Ambulatory Visit: Payer: Self-pay | Admitting: Family Medicine

## 2019-08-21 ENCOUNTER — Other Ambulatory Visit: Payer: Self-pay

## 2019-08-21 ENCOUNTER — Ambulatory Visit (INDEPENDENT_AMBULATORY_CARE_PROVIDER_SITE_OTHER): Payer: Medicaid Other | Admitting: Family Medicine

## 2019-08-21 VITALS — BP 130/70 | HR 74 | Wt 205.6 lb

## 2019-08-21 DIAGNOSIS — H05331 Deformity of right orbit due to trauma or surgery: Secondary | ICD-10-CM

## 2019-08-21 DIAGNOSIS — Z9851 Tubal ligation status: Secondary | ICD-10-CM

## 2019-08-21 DIAGNOSIS — H1131 Conjunctival hemorrhage, right eye: Secondary | ICD-10-CM | POA: Diagnosis not present

## 2019-08-21 DIAGNOSIS — S0590XA Unspecified injury of unspecified eye and orbit, initial encounter: Secondary | ICD-10-CM | POA: Diagnosis not present

## 2019-08-21 DIAGNOSIS — N939 Abnormal uterine and vaginal bleeding, unspecified: Secondary | ICD-10-CM | POA: Diagnosis not present

## 2019-08-21 DIAGNOSIS — B009 Herpesviral infection, unspecified: Secondary | ICD-10-CM

## 2019-08-21 LAB — POCT URINE PREGNANCY: Preg Test, Ur: NEGATIVE

## 2019-08-21 MED ORDER — VALACYCLOVIR HCL 1 G PO TABS: 1000.0000 mg | ORAL_TABLET | Freq: Every day | ORAL | 3 refills | Status: AC

## 2019-08-21 MED ORDER — NORGESTIMATE-ETH ESTRADIOL 0.25-35 MG-MCG PO TABS: 1.0000 | ORAL_TABLET | Freq: Every day | ORAL | 11 refills | Status: DC

## 2019-08-21 NOTE — Assessment & Plan Note (Signed)
Now desires children. Referral to Ob placed

## 2019-08-21 NOTE — Assessment & Plan Note (Signed)
Patient with recent eye trauma.  There appears to be significant subconjunctival hemorrhage on the right side.  No hyphema noted.  Pain with extraocular movement to the right.  Urgent referral to ophthalmology placed.  I also called Dr. Alver Fisher office and was able to set her up an appointment at 2:10 this afternoon.  They will accept her Medicaid.  Will also obtain CT without contrast of the orbits, this is what Dr. Sherryll Burger recommends per his office.  Strict return precautions given.  Follow-up in 1 week after she sees Dr. Sherryll Burger.  [facility] given to patient.  She states she feels safe at home currently but will call if she needs anything.

## 2019-08-21 NOTE — Assessment & Plan Note (Signed)
Valtrex RX given

## 2019-08-21 NOTE — Assessment & Plan Note (Signed)
Patient has insurance we are able to do full work-up.  Will obtain CBC, TSH, urine pregnancy, anemia panel, pelvic ultrasound.  Urine pregnancy was negative in office.  Will give trial of Sprintec to help regulate her periods more.  Gust different forms of birth control patient chose OCPs.  Strict return precautions given.  Follow-up in 1 month.

## 2019-08-21 NOTE — Patient Instructions (Signed)
It was a pleasure seeing you today.   Today we discussed your eye and abnormal uterine bleeding  For your eye: I have scheduled you with Dr. Sherryll Burger TODAY 2/10 @2 :10PM. IT IS VERY IMPORTANT YOU ATTEND THIS APPOINTMENT SINCE YOU HAVE SOME LOSS OF VISION. DO NOT SKIP THIS APPOINTMENT. Arrive 15 min early.  I have also scheduled a CT of your eye.   For your bleeding: I have prescribed you sprintec. Take this daily. I have also ordered labs. I have also ordered an ultrasound of your pelvis.   Please follow up in 2 weeks or sooner if symptoms persist or worsen. Please call the clinic immediately if you have any concerns.   Our clinic's number is [phone]. Please call with questions or concerns.   Please go to the emergency room if you have worsening vision changes, worsening pain  Thank you,  [phone], DO
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
## 2019-08-21 NOTE — Addendum Note (Signed)
Addended by: Ellender Hose on: 08/21/2019 12:17 PM   Modules accepted: Orders, Level of Service

## 2019-08-22 ENCOUNTER — Encounter: Payer: Self-pay | Admitting: Family Medicine

## 2019-08-22 LAB — ANEMIA PANEL
Ferritin: 6 ng/mL — ABNORMAL LOW (ref 15–150)
Folate, Hemolysate: 359 ng/mL
Folate, RBC: 928 ng/mL (ref >498)
Hematocrit: 38.7 % (ref 34.0–46.6)
Iron Saturation: 8 % — CL (ref 15–55)
Iron: 36 ug/dL (ref 27–159)
Retic Ct Pct: 1 % (ref 0.6–2.6)
Total Iron Binding Capacity: 473 ug/dL — ABNORMAL HIGH (ref 250–450)
UIBC: 437 ug/dL — ABNORMAL HIGH (ref 131–425)
Vitamin B-12: 546 pg/mL (ref 232–1245)

## 2019-08-22 LAB — CBC
Hemoglobin: 12.3 g/dL (ref 11.1–15.9)
MCH: 25.3 pg — ABNORMAL LOW (ref 26.6–33.0)
MCHC: 31.8 g/dL (ref 31.5–35.7)
MCV: 80 fL (ref 79–97)
Platelets: 256 10*3/uL (ref 150–450)
RBC: 4.86 x10E6/uL (ref 3.77–5.28)
RDW: 13.8 % (ref 11.7–15.4)
WBC: 9.7 10*3/uL (ref 3.4–10.8)

## 2019-08-22 LAB — TSH: TSH: 0.459 u[IU]/mL (ref 0.450–4.500)

## 2019-08-28 ENCOUNTER — Ambulatory Visit (HOSPITAL_COMMUNITY)
Admission: RE | Admit: 2019-08-28 | Discharge: 2019-08-28 | Disposition: A | Discharge status: Home / Self Care | Payer: Medicaid Other | Source: Ambulatory Visit | Attending: Family Medicine | Admitting: Family Medicine

## 2019-08-28 ENCOUNTER — Other Ambulatory Visit: Payer: Self-pay

## 2019-08-28 DIAGNOSIS — S0591XA Unspecified injury of right eye and orbit, initial encounter: Secondary | ICD-10-CM | POA: Diagnosis not present

## 2019-08-28 DIAGNOSIS — N939 Abnormal uterine and vaginal bleeding, unspecified: Secondary | ICD-10-CM | POA: Insufficient documentation

## 2019-08-28 DIAGNOSIS — H05331 Deformity of right orbit due to trauma or surgery: Secondary | ICD-10-CM | POA: Diagnosis not present

## 2019-09-09 ENCOUNTER — Ambulatory Visit: Payer: Medicaid Other | Admitting: Family Medicine

## 2019-09-09 NOTE — Progress Notes (Deleted)
    SUBJECTIVE:   CHIEF COMPLAINT / HPI:   *** Eye concern Last seen on 2/10.  At that time patient was advised to see Dr. Sherryll Burger that afternoon.  Recommendations were for***.  CT orbits was normal.  AUB Lab work showed normal CBC, normal TSH, negative pregnancy test.  Pelvic ultrasound showed normal-appearing uterus and ovaries. PERTINENT  PMH / PSH: ***  OBJECTIVE:   There were no vitals taken for this visit.  ***  ASSESSMENT/PLAN:   No problem-specific Assessment & Plan notes found for this encounter.     [name], DO [facility]

## 2019-10-24 IMAGING — DX DG CHEST 1V PORT
1 series · 1 of 1 positions shown · non-contrast
Comparison: 11/20/2008

CLINICAL DATA: Chest pain

EXAM:
PORTABLE CHEST 1 VIEW

[chest ap]
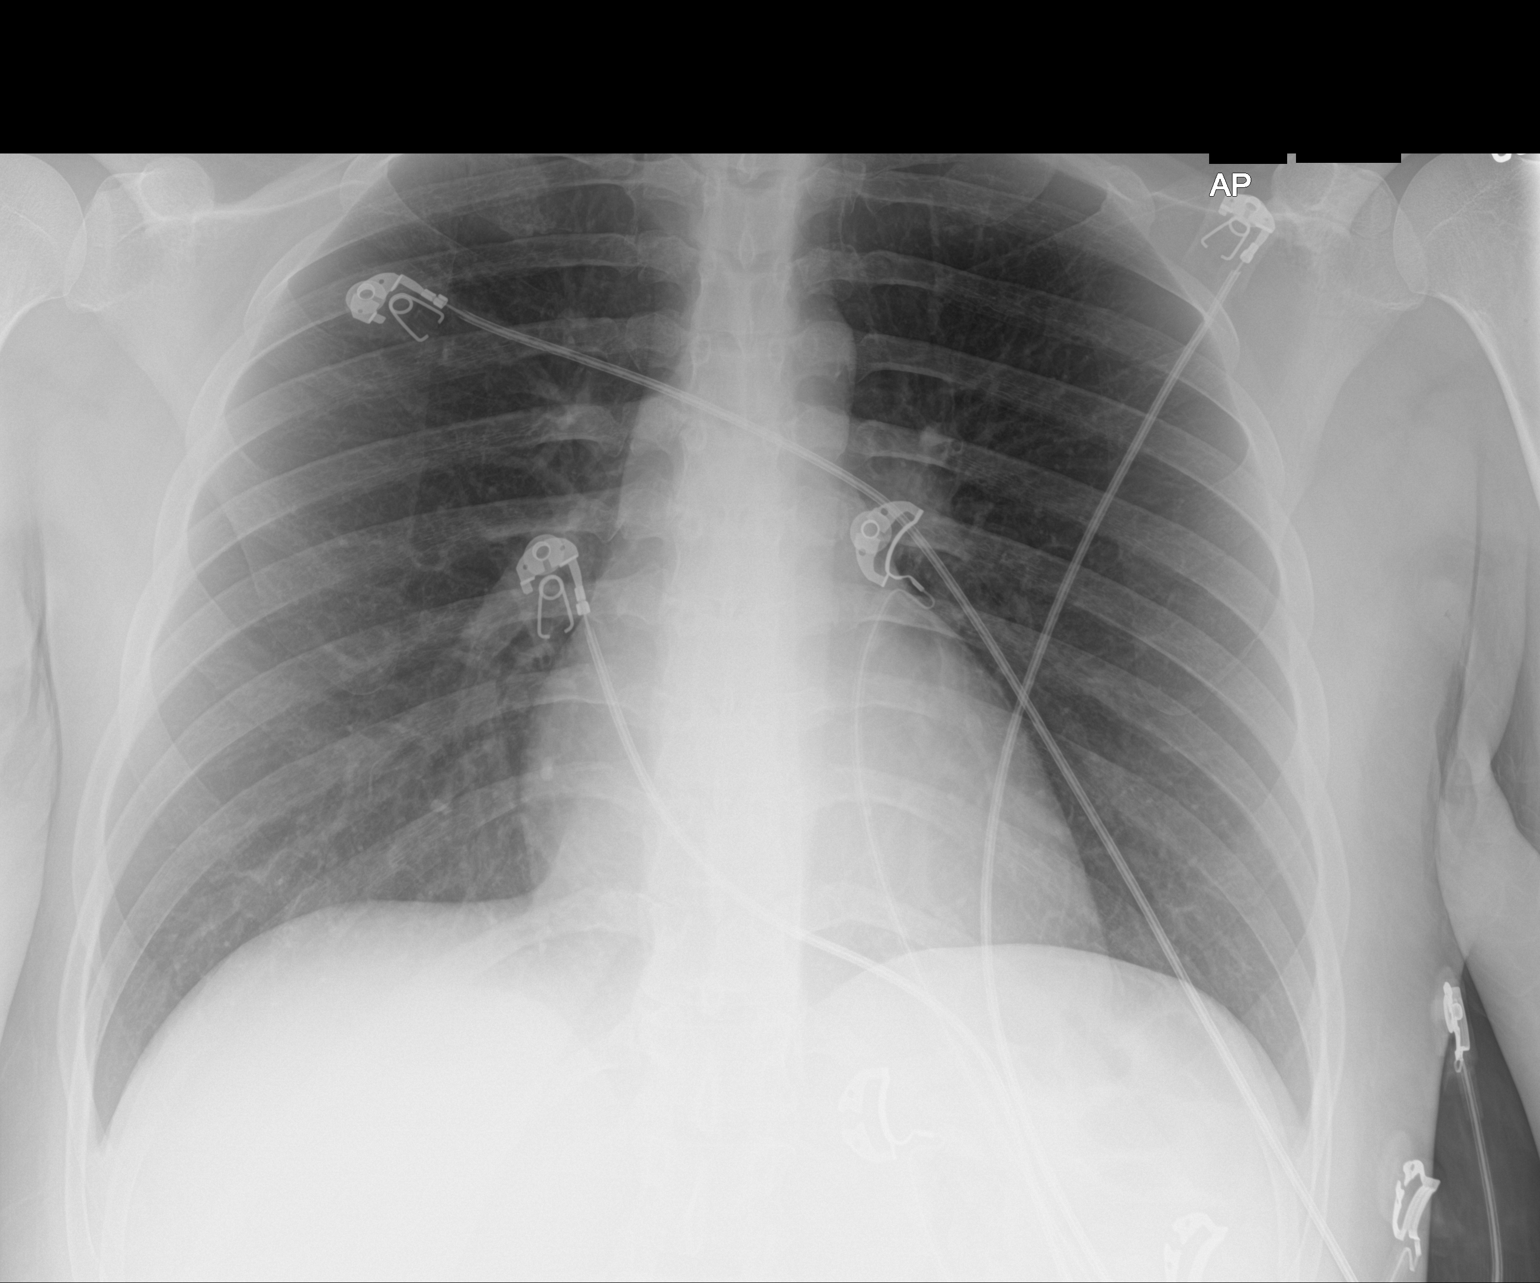

[1 of 1 positions shown; findings below may reference images not displayed]

FINDINGS: The heart size and mediastinal contours are within normal limits.
Both lungs are clear. The visualized skeletal structures are
unremarkable.
IMPRESSION: No active disease.

## 2019-10-24 IMAGING — CT CT ANGIO CHEST
2 of 8 series · 13 of 46 positions shown · IV contrast (ISOVUE)
Comparison: None.

CLINICAL DATA: Headache with bilateral blurred vision and back pain
for 3 days. Shortness of breath.

EXAM:
CT ANGIOGRAPHY CHEST WITH CONTRAST
TECHNIQUE: Multidetector CT imaging of the chest was performed using the
standard protocol during bolus administration of intravenous
contrast. Multiplanar CT image reconstructions and MIPs were
obtained to evaluate the vascular anatomy.
CONTRAST:  100mL 598NIQ-GVV IOPAMIDOL (598NIQ-GVV) INJECTION 76%,
80mL 598NIQ-GVV IOPAMIDOL (598NIQ-GVV) INJECTION 76%

[Series 12: thins · axial · 0.69mm/px · z∈[-150,+74]mm · 10 of 274 slices shown]
[im 25/274  lung]
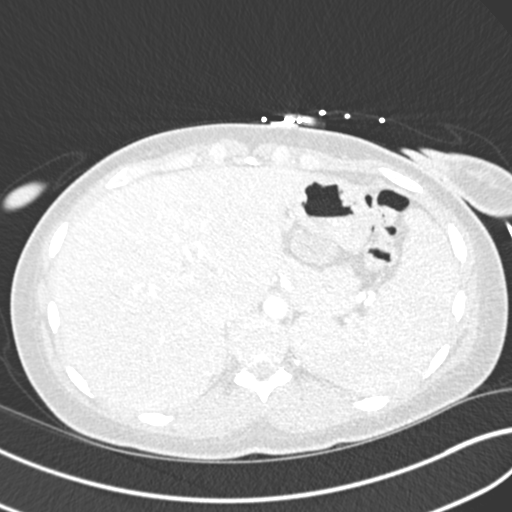
[im 50/274  soft-tissue]
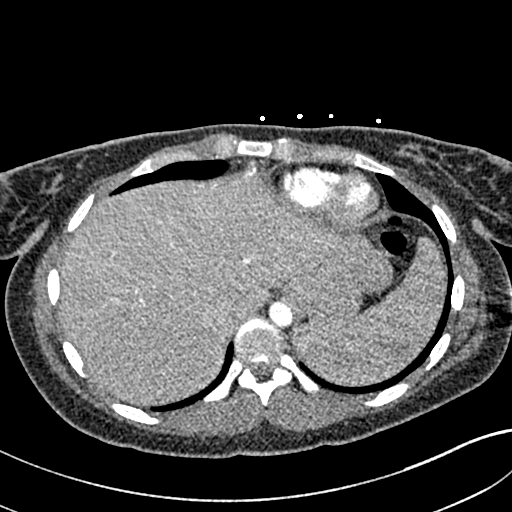
[im 75/274  lung]
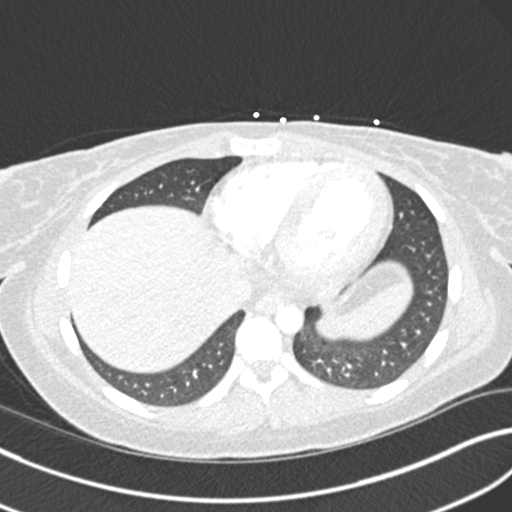
[im 100/274  soft-tissue]
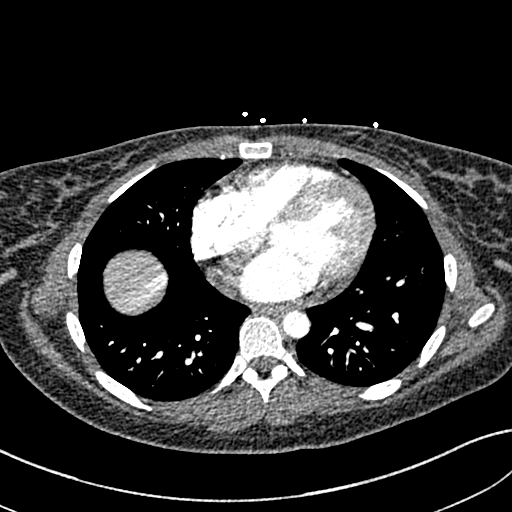
[im 125/274  lung]
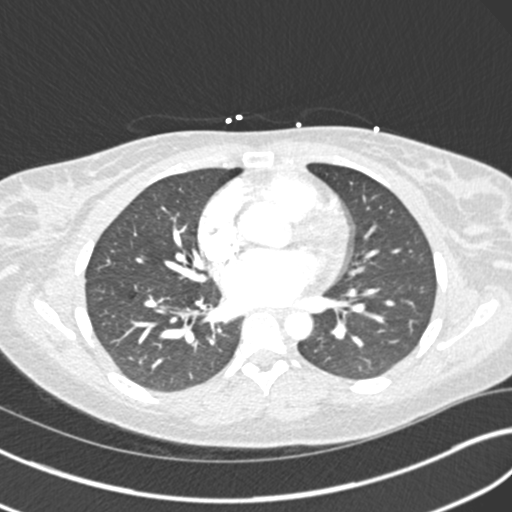
[im 149/274  soft-tissue]
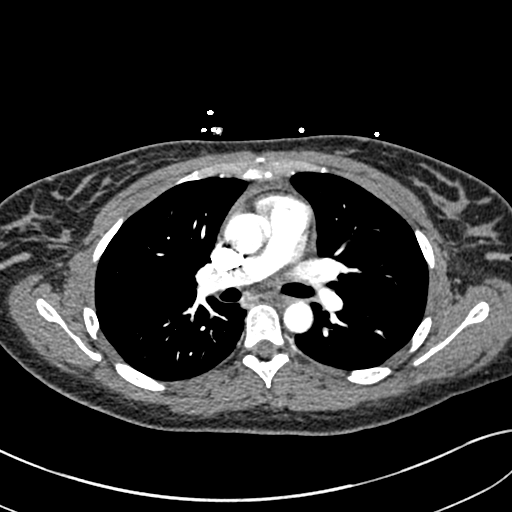
[im 174/274  lung]
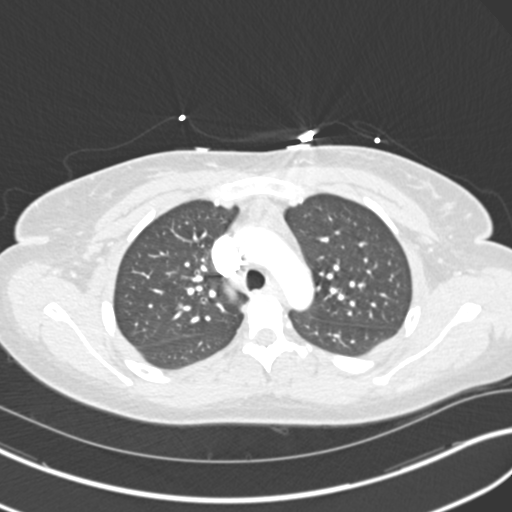
[im 199/274  soft-tissue]
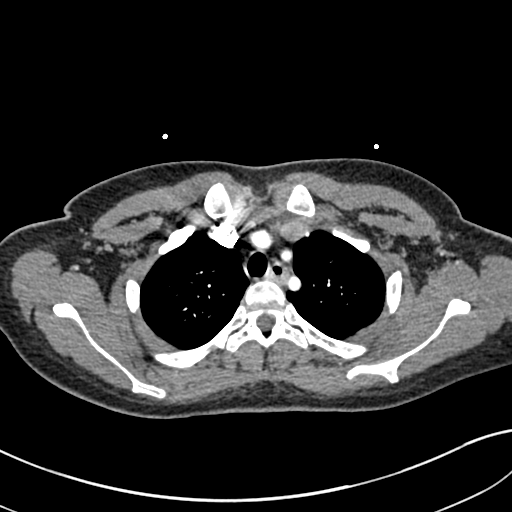
[im 224/274  lung]
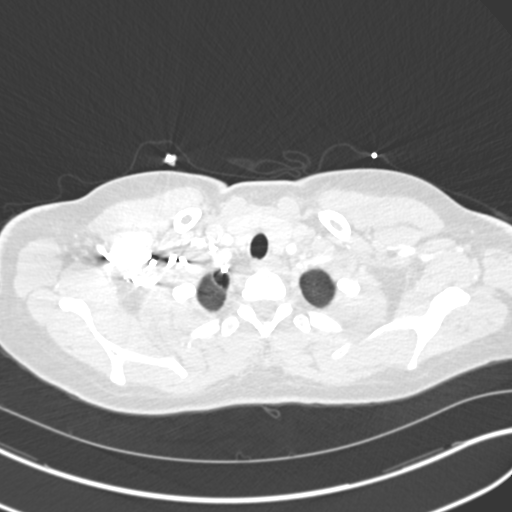
[im 249/274  soft-tissue]
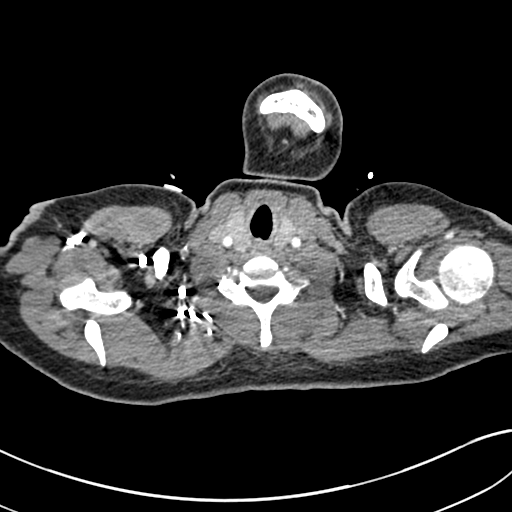

[Series 14: coronal mpr · coronal · 0.54mm/px · 3 of 123 slices shown]
[im 31/123  soft-tissue]
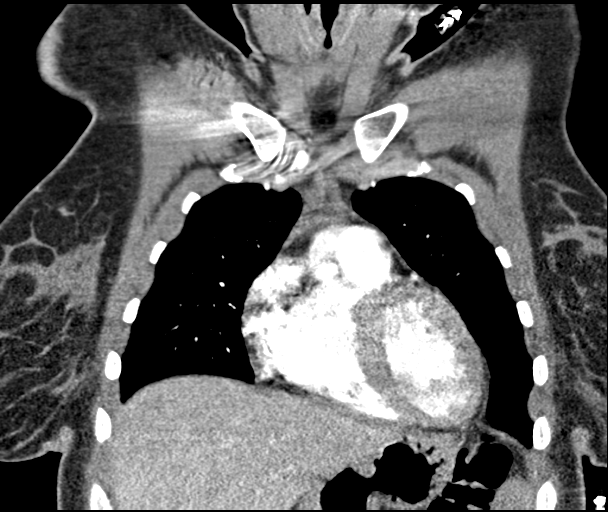
[im 62/123  soft-tissue]
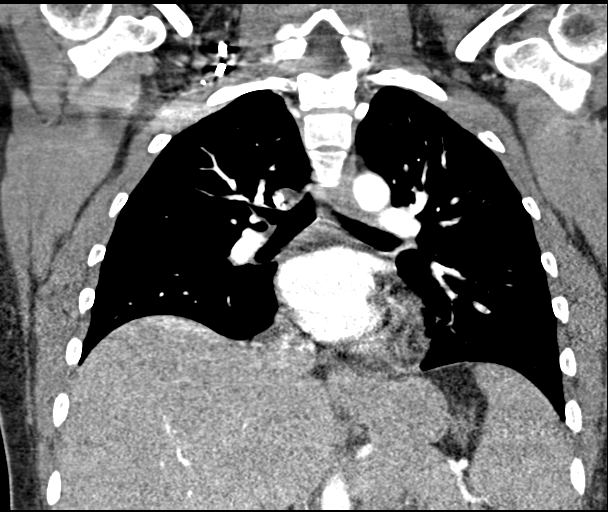
[im 92/123  soft-tissue]
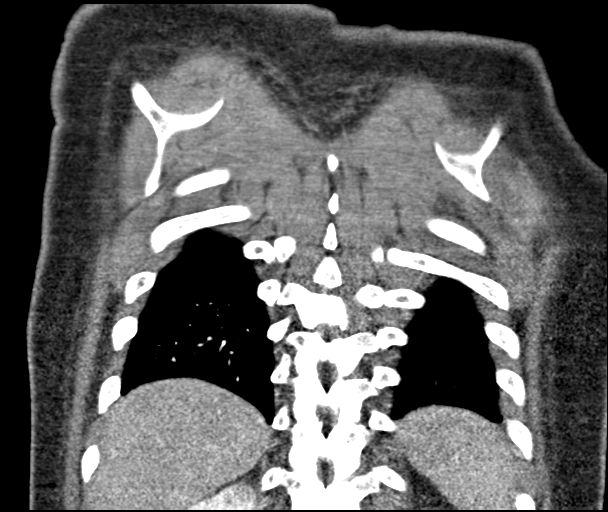

[13 of 46 positions shown; findings below may reference images not displayed]

FINDINGS: Cardiovascular: Initial contrast injection was suboptimal and the
patient was reinjected period reinjection images demonstrate good
opacification of the central and segmental pulmonary arteries. No
focal filling defects are demonstrated. No evidence of significant
pulmonary embolus. Normal caliber thoracic aorta. No aortic
dissection. Normal heart size. No pericardial effusion.

Mediastinum/Nodes: Esophagus is decompressed. No significant
lymphadenopathy in the chest. Residual thymic tissue in the anterior
mediastinum.

Lungs/Pleura: Tiny right apical bleb. Lungs are otherwise clear and
expanded. No consolidation or edema. No pleural effusions. No
pneumothorax.

Upper Abdomen: No acute abnormalities demonstrated in the visualized
upper abdomen.

Musculoskeletal: No chest wall abnormality. No acute or significant
osseous findings.

Review of the MIP images confirms the above findings.
IMPRESSION: No evidence of significant pulmonary embolus. No evidence of active
pulmonary disease.

## 2019-10-25 ENCOUNTER — Ambulatory Visit: Payer: Medicaid Other | Admitting: Family Medicine

## 2019-10-31 ENCOUNTER — Ambulatory Visit (INDEPENDENT_AMBULATORY_CARE_PROVIDER_SITE_OTHER): Payer: Medicaid Other | Admitting: Family Medicine

## 2019-10-31 ENCOUNTER — Other Ambulatory Visit: Payer: Self-pay

## 2019-10-31 ENCOUNTER — Encounter: Payer: Self-pay | Admitting: Family Medicine

## 2019-10-31 VITALS — BP 100/60 | HR 76 | Ht 64.0 in | Wt 206.4 lb

## 2019-10-31 DIAGNOSIS — N939 Abnormal uterine and vaginal bleeding, unspecified: Secondary | ICD-10-CM

## 2019-10-31 DIAGNOSIS — Z9851 Tubal ligation status: Secondary | ICD-10-CM

## 2019-10-31 DIAGNOSIS — Z319 Encounter for procreative management, unspecified: Secondary | ICD-10-CM

## 2019-10-31 MED ORDER — NORGESTIMATE-ETH ESTRADIOL 0.25-35 MG-MCG PO TABS: 1.0000 | ORAL_TABLET | Freq: Every day | ORAL | 11 refills | Status: DC

## 2019-10-31 MED ORDER — FERROUS SULFATE 134 MG PO TABS: 1.0000 | ORAL_TABLET | ORAL | 0 refills | Status: DC

## 2019-10-31 NOTE — Patient Instructions (Addendum)
Thank you for coming to see me today. It was a pleasure! Today we talked about:   I have sent in Sprintec or an oral contraceptive to your pharmacy that you may use in order to help decrease your bleeding and regulate your period.  It is important that you take this every day at the same time.  If you decide not to take the oral contraceptives then you may use an anti-inflammatory medication such as naproxen or ibuprofen scheduled during your periods to decrease cramping and bleeding.  I will place a referral to an infertility doctor to discuss tubal ligation reversal.  You should hear a call from them within 2 weeks.  If you do not receive a phone call then please call our office back to ensure that the referral has gone through.  I have provided some resources below for therapy and counseling services.  Please follow-up as needed.  If you have any questions or concerns, please do not hesitate to call the office at [phone].  Take Care,   [place] Tabitha Riggins, DO  Therapy and Counseling Resources Most providers on this list will take Medicaid. Patients with commercial insurance or Medicare should contact their insurance company to get a list of in network providers.  [facility] [address]  [place], [place] [address]       [phone]     [facility] [address] [place], [place]  [phone]    [facility] [address] [place], [place] [phone]  [facility]:  [address] [place] [phone]  [facility]:  [address] [place], [place] [phone]  [facility] (under & uninsured) [address]   [place] [phone]    [email or web address]    [facility] [place] -[address]     Phone:  [phone]     [facility]-  [address]  [phone]   [facility] [address]      [place], [place] [phone]  [facility]: [address] [place], [place], [place]  [phone]   [facility] (Spanish therapist) [address] [place]  [address]   [place] [address]    [phone]    [facility]   [address],  [place], [place]  [phone]   [facility]  [address] [place]  [phone]  [facility]  [address] [place], [place]        [phone]  [facility] under & uninsured [place],  [address], [place] [phone]):  Mon - Fri from 8 AM - 3 PM  [facility] [address],  (Spanish)   [address], [place]: [phone]) 8:30 - 12; 1 - 2:30  [facility] [employer],  [address] [place], [place]    [phone]):8:30 - 12; 2 - 3PM  [facility] [employer],  [address]  [place]; [phone]):   Mon - Fri 8 AM - 5 PM  [facility] [address] [place]  MWF 12:30 to 3:00 or call to schedule an appointment  [phone]  Specific Provider options Psychology Today  [email or web address] 1. click on find a therapist  2. enter your zip code 3. left side and select or tailor a therapist for  your specific need.   [facility] Provider Directory [email or web address]  (Medicaid)   Follow all drop down to find a provider  [place] or [email or web address] [address], [place], [place] Recovery support and educational   In home counseling [place] Telephone: [phone]  office in [place] [email or web address]   Does not take reg. Medicaid or Medicare private insurance [facility], [place] health Choice, [facility], [place], [place], [place], [place] Health Choice  24- Hour Availability:  . [place] or [phone]  . [facility] the [employer] [phone]  [facility] Crisis Service  [phone]   . [place]  [phone] (after hours)  . [facility]rapeutic Alternative/Mobile Crisis   [phone]  . [place] National Suicide Hotline  [phone] ([place])  . Call 911 or go to emergency room  . [employer]  [phone]);  [place] and [employer]   . [facility] ACCESS  [phone]); [place], [place], [place], [place], [place], [place], [place]

## 2019-10-31 NOTE — [place] Notes (Signed)
   SUBJECTIVE:   CHIEF COMPLAINT / HPI:   Fertility counseling: Patient reports that she is interesting in having a tubal ligation reversal.  States that she has previously inquired about this but never had any follow-up.  Upon chart review it appears that patient was referred to OB/GYN but needed to be sent to an infertility doctor.  AUB: Patient reports that she has been having irregular periods for months.  Patient has been seen multiple times at our office for this with last visit 08/21/2019.  Patient has previously had normal pelvic exams with history of tubal ligation.  Patient reports that she never started taking OCPs as previously prescribed.  She reports that she soaks through pads frequently and states that they come during the month and will usually last 7 days and she will bleed heavily.  She has not tried any NSAIDs.  She denies any dizziness, lightheadedness, chest pain, palpitations, shortness of breath, fatigue.    PERTINENT  PMH / PSH: Abnormal uterine bleeding, HSV-2,  OBJECTIVE:  BP 100/60   Pulse 76   Ht 5\' 4"  (1.626 m)   Wt 206 lb 6 oz (93.6 kg)   LMP 09/30/2019   SpO2 98%   BMI 35.42 kg/m   General: NAD, pleasant Neck: Supple, no LAD Respiratory: normal work of breathing Neuro: CN II-XII grossly intact Psych: AOx3, appropriate affect  ASSESSMENT/PLAN:   Abnormal uterine bleeding (AUB) Last seen 08/21/2019 with normal CBC, TSH, negative urine pregnancy and pelvic ultrasound.  Patient reports she was given trial of Sprintec but never received this from her pharmacy and did not begin taking it. Patient with history of tubal ligation. Denies any worsening of symptoms and states that her period remains regular just heavy and lasting 7 days at a time. She denies any symptoms of anemia at this time. Refilled patient's iron supplementation and Sprintec given recent negative work up. Patient to return if she has continued bleeding.   H/O tubal ligation Patient now  desires children.  Recently started on OCP for irregular bleeding however patient would need to have discussion with infertility.  Referral placed for infertility as previous referral from OB/GYN was denied stating that they do not do these.    10/19/2019 Jalal Rauch, DO PGY-3, [place] [facility]

## 2019-11-05 ENCOUNTER — Encounter: Payer: Self-pay | Admitting: Family Medicine

## 2019-11-05 NOTE — Assessment & Plan Note (Signed)
Patient now desires children.  Recently started on OCP for irregular bleeding however patient would need to have discussion with infertility.  Referral placed for infertility as previous referral from OB/GYN was denied stating that they do not do these.

## 2019-11-05 NOTE — Assessment & Plan Note (Addendum)
Last seen 08/21/2019 with normal CBC, TSH, negative urine pregnancy and pelvic ultrasound.  Patient reports she was given trial of Sprintec but never received this from her pharmacy and did not begin taking it. Patient with history of tubal ligation. Denies any worsening of symptoms and states that her period remains regular just heavy and lasting 7 days at a time. She denies any symptoms of anemia at this time. Refilled patient's iron supplementation and Sprintec given recent negative work up. Patient to return if she has continued bleeding.

## 2019-12-04 ENCOUNTER — Ambulatory Visit (INDEPENDENT_AMBULATORY_CARE_PROVIDER_SITE_OTHER): Payer: Medicaid Other | Admitting: Family Medicine

## 2019-12-04 ENCOUNTER — Other Ambulatory Visit: Payer: Self-pay

## 2019-12-04 ENCOUNTER — Encounter: Payer: Self-pay | Admitting: Family Medicine

## 2019-12-04 VITALS — BP 125/75 | HR 74 | Ht 64.0 in | Wt 205.0 lb

## 2019-12-04 DIAGNOSIS — W19XXXA Unspecified fall, initial encounter: Secondary | ICD-10-CM

## 2019-12-04 DIAGNOSIS — M25532 Pain in left wrist: Secondary | ICD-10-CM | POA: Diagnosis not present

## 2019-12-04 MED ORDER — TRAMADOL HCL 50 MG PO TABS: 50.0000 mg | ORAL_TABLET | Freq: Four times a day (QID) | ORAL | 0 refills | Status: AC | PRN

## 2019-12-04 NOTE — Progress Notes (Signed)
PHQ2 and 9 given to patient.  Provider aware.  .Jaquavius Hudler R Wetona Viramontes, CMA  

## 2019-12-04 NOTE — Patient Instructions (Signed)
Today we talked about your fall, it does not seem like you have any fractures in your knees.  The pain in your wrist and thumb is a little more concerning so were going to order an x-ray for that.  You can get this done over at the [facility] as a walk-in outpatient issue.  You should see the results as soon as they come up because you have access to MyChart.  But to give you 3 days worth of tramadol to try and help ease the pain but you should be able to return to work reasonably soon.  You can buy it over-the-counter wrist brace at any pharmacy to help you avoid pain as you recover  -Dr. Parke Simmers

## 2019-12-07 DIAGNOSIS — W19XXXA Unspecified fall, initial encounter: Secondary | ICD-10-CM | POA: Insufficient documentation

## 2019-12-07 NOTE — [place] Notes (Signed)
    SUBJECTIVE:   CHIEF COMPLAINT / HPI:   Mechanical fall at work 2 days prior, tripped on something on the ground fell from a standing height on outstretched hands.  Since then she has had pain in the left thumb and wrist.  She has had no significant swelling, no obvious bruising, she was not cut, she did not hit her head or had loss of consciousness, there was no vertigo symptoms.  She is had no neurological symptoms since, no abdominal chest pain or shortness of breath.  No trouble with her gait  PERTINENT  PMH / PSH:   OBJECTIVE:   BP 125/75   Pulse 74   Ht 5\' 4"  (1.626 m)   Wt 205 lb (93 kg)   SpO2 99%   BMI 35.19 kg/m   General: Alert and pleasant MSK: Minimal pain to left snuffbox, most pain is to the carpal of left thumb, there is no obvious swelling or deformation.  Distal perfusion and sensation are intact.  No range of motion restriction, injury or pain to forearm elbow or shoulder beyond that of the left hand  ASSESSMENT/PLAN:   Fall Mechanical fall 2 days ago, only residual pain is left thumb and left wrist.  There is no obvious swelling or deformation.  X-ray ordered and patient can use over-the-counter wrist splint if she wishes to prior to x-ray, tenderness is moderate but constant and achy so give short course of tramadol which we discussed would not be a long-term medication.  Emergency precautions were discussed     , DO [facility]

## 2019-12-07 NOTE — Assessment & Plan Note (Signed)
Mechanical fall 2 days ago, only residual pain is left thumb and left wrist.  There is no obvious swelling or deformation.  X-ray ordered and patient can use over-the-counter wrist splint if she wishes to prior to x-ray, tenderness is moderate but constant and achy so give short course of tramadol which we discussed would not be a long-term medication.  Emergency precautions were discussed

## 2019-12-10 ENCOUNTER — Emergency Department (HOSPITAL_COMMUNITY): Payer: Medicaid Other

## 2019-12-10 ENCOUNTER — Other Ambulatory Visit: Payer: Self-pay

## 2019-12-10 ENCOUNTER — Encounter (HOSPITAL_COMMUNITY): Payer: Self-pay

## 2019-12-10 ENCOUNTER — Emergency Department (HOSPITAL_COMMUNITY)
Admission: EM | Admit: 2019-12-10 | Discharge: 2019-12-10 | Disposition: A | Discharge status: Home / Self Care | Payer: Medicaid Other | Attending: Emergency Medicine | Admitting: Emergency Medicine

## 2019-12-10 DIAGNOSIS — Z87891 Personal history of nicotine dependence: Secondary | ICD-10-CM | POA: Diagnosis not present

## 2019-12-10 DIAGNOSIS — N730 Acute parametritis and pelvic cellulitis: Secondary | ICD-10-CM

## 2019-12-10 DIAGNOSIS — A599 Trichomoniasis, unspecified: Secondary | ICD-10-CM | POA: Diagnosis not present

## 2019-12-10 DIAGNOSIS — D573 Sickle-cell trait: Secondary | ICD-10-CM | POA: Diagnosis not present

## 2019-12-10 DIAGNOSIS — R519 Headache, unspecified: Secondary | ICD-10-CM | POA: Diagnosis not present

## 2019-12-10 DIAGNOSIS — R102 Pelvic and perineal pain: Secondary | ICD-10-CM

## 2019-12-10 LAB — WET PREP, GENITAL
Sperm: NONE SEEN
Yeast Wet Prep HPF POC: NONE SEEN

## 2019-12-10 LAB — URINALYSIS, ROUTINE W REFLEX MICROSCOPIC
Bilirubin Urine: NEGATIVE
Glucose, UA: NEGATIVE mg/dL
Ketones, ur: NEGATIVE mg/dL
Leukocytes,Ua: NEGATIVE
Nitrite: NEGATIVE
Protein, ur: NEGATIVE mg/dL
Specific Gravity, Urine: 1.012 (ref 1.005–1.030)
pH: 5 (ref 5.0–8.0)

## 2019-12-10 LAB — CBC WITH DIFFERENTIAL/PLATELET
Abs Immature Granulocytes: 0.03 10*3/uL (ref 0.00–0.07)
Basophils Absolute: 0.1 10*3/uL (ref 0.0–0.1)
Basophils Relative: 1 %
Eosinophils Absolute: 0.1 10*3/uL (ref 0.0–0.5)
Eosinophils Relative: 1 %
HCT: 38 % (ref 36.0–46.0)
Hemoglobin: 12 g/dL (ref 12.0–15.0)
Immature Granulocytes: 0 %
Lymphocytes Relative: 15 %
Lymphs Abs: 1.5 10*3/uL (ref 0.7–4.0)
MCH: 24.9 pg — ABNORMAL LOW (ref 26.0–34.0)
MCHC: 31.6 g/dL (ref 30.0–36.0)
MCV: 79 fL — ABNORMAL LOW (ref 80.0–100.0)
Monocytes Absolute: 0.6 10*3/uL (ref 0.1–1.0)
Monocytes Relative: 5 %
Neutro Abs: 7.8 10*3/uL — ABNORMAL HIGH (ref 1.7–7.7)
Neutrophils Relative %: 78 %
Platelets: 249 10*3/uL (ref 150–400)
RBC: 4.81 MIL/uL (ref 3.87–5.11)
RDW: 15.7 % — ABNORMAL HIGH (ref 11.5–15.5)
WBC: 10.1 10*3/uL (ref 4.0–10.5)
nRBC: 0 % (ref 0.0–0.2)

## 2019-12-10 LAB — COMPREHENSIVE METABOLIC PANEL
ALT: 15 U/L (ref 0–44)
AST: 13 U/L — ABNORMAL LOW (ref 15–41)
Albumin: 3.5 g/dL (ref 3.5–5.0)
Alkaline Phosphatase: 65 U/L (ref 38–126)
Anion gap: 6 (ref 5–15)
BUN: 13 mg/dL (ref 6–20)
CO2: 25 mmol/L (ref 22–32)
Calcium: 9 mg/dL (ref 8.9–10.3)
Chloride: 107 mmol/L (ref 98–111)
Creatinine, Ser: 0.69 mg/dL (ref 0.44–1.00)
GFR calc Af Amer: >60 mL/min (ref >60)
GFR calc non Af Amer: >60 mL/min (ref >60)
Glucose, Bld: 94 mg/dL (ref 70–99)
Potassium: 4 mmol/L (ref 3.5–5.1)
Sodium: 138 mmol/L (ref 135–145)
Total Bilirubin: 0.1 mg/dL — ABNORMAL LOW (ref 0.3–1.2)
Total Protein: 6.3 g/dL — ABNORMAL LOW (ref 6.5–8.1)

## 2019-12-10 LAB — HIV ANTIBODY (ROUTINE TESTING W REFLEX): HIV Screen 4th Generation wRfx: NONREACTIVE

## 2019-12-10 LAB — I-STAT BETA HCG BLOOD, ED (MC, WL, AP ONLY): I-stat hCG, quantitative: <5 m[IU]/mL (ref <5)

## 2019-12-10 MED ORDER — DOXYCYCLINE HYCLATE 100 MG PO CAPS
100.0000 mg | ORAL_CAPSULE | Freq: Two times a day (BID) | ORAL | 0 refills | Status: DC
Start: 2019-12-10 — End: 2022-02-14

## 2019-12-10 MED ORDER — METRONIDAZOLE 500 MG PO TABS
500.0000 mg | ORAL_TABLET | Freq: Two times a day (BID) | ORAL | 0 refills | Status: DC
Start: 2019-12-10 — End: 2021-01-13

## 2019-12-10 MED ORDER — NAPROXEN 500 MG PO TABS: 500.0000 mg | ORAL_TABLET | Freq: Two times a day (BID) | ORAL | 0 refills | Status: DC | PRN

## 2019-12-10 MED ORDER — DIPHENHYDRAMINE HCL 50 MG/ML IJ SOLN
12.5000 mg | Freq: Once | INTRAMUSCULAR | Status: AC
  Administered 2019-12-10: 12.5 mg via INTRAVENOUS
  Filled 2019-12-10: qty 1

## 2019-12-10 MED ORDER — STERILE WATER FOR INJECTION IJ SOLN
INTRAMUSCULAR | Status: AC
  Filled 2019-12-10: qty 10

## 2019-12-10 MED ORDER — CEFTRIAXONE SODIUM 500 MG IJ SOLR
500.0000 mg | Freq: Once | INTRAMUSCULAR | Status: AC
  Administered 2019-12-10: 500 mg via INTRAMUSCULAR
  Filled 2019-12-10: qty 500

## 2019-12-10 MED ORDER — SODIUM CHLORIDE 0.9 % IV BOLUS
500.0000 mL | Freq: Once | INTRAVENOUS | Status: AC
  Administered 2019-12-10: 500 mL via INTRAVENOUS

## 2019-12-10 MED ORDER — PROCHLORPERAZINE EDISYLATE 10 MG/2ML IJ SOLN
10.0000 mg | Freq: Once | INTRAMUSCULAR | Status: AC
  Administered 2019-12-10: 10 mg via INTRAVENOUS
  Filled 2019-12-10: qty 2

## 2019-12-10 MED ORDER — ONDANSETRON 4 MG PO TBDP
4.0000 mg | ORAL_TABLET | Freq: Three times a day (TID) | ORAL | 0 refills | Status: DC | PRN
Start: 2019-12-10 — End: 2022-02-14

## 2019-12-10 NOTE — ED Triage Notes (Signed)
Patient complains of recurring migraines and thinks related to Bp. States that she does not take medication. Also complains of lower abdominal sharp pain with no nausea nor vomiting.

## 2019-12-10 NOTE — Discharge Instructions (Signed)
You were seen in the emergency department today for a headache and pelvic pain.  Your labs were overall reassuring.  Your ultrasound was normal.  The samples we took with your pelvic exam showed that you have trichomonas which is a sexually transmitted infection.  We are concerned that you may have pelvic inflammatory disease.  We are sending you home with the following medicines to treat the infection as well as your symptoms: -Doxycycline and Flagyl: These are both antibiotics to take twice per day for the next 2 weeks.  Be sure to take these with food as they can cause an upset stomach.  Do not drink alcohol when taking Flagyl as it can have very severe side effects.  - Naproxen is a nonsteroidal anti-inflammatory medication that will help with pain and swelling. Be sure to take this medication as prescribed with food, 1 pill every 12 hours,  It should be taken with food, as it can cause stomach upset, and more seriously, stomach bleeding. Do not take other nonsteroidal anti-inflammatory medications with this such as Advil, Motrin, Aleve, Mobic, Goodie Powder, or Motrin.    -Zofran: Take this every 8 hours as needed for nausea and vomiting.  You make take Tylenol per over the counter dosing with these medications.   We have prescribed you new medication(s) today. Discuss the medications prescribed today with your pharmacist as they can have adverse effects and interactions with your other medicines including over the counter and prescribed medications. Seek medical evaluation if you start to experience new or abnormal symptoms after taking one of these medicines, seek care immediately if you start to experience difficulty breathing, feeling of your throat closing, facial swelling, or rash as these could be indications of a more serious allergic reaction   Please inform all sexual partners of your trichomonas as they should seek evaluation and possible treatment if needed.  Not have intercourse until  at least 1 week following last dose of antibiotics.  Please follow-up with your primary care provider as well as an OB/GYN within the next 3 to 5 days.  Return to the ER for new or worsening symptoms including but not limited to fever, increased pain, change in your vision, numbness, weakness, dizziness, inability to keep fluids down, passing out, or any other concerns.

## 2019-12-10 NOTE — ED Notes (Signed)
Patient is transported to US at this time.

## 2019-12-10 NOTE — ED Provider Notes (Signed)
[facility] EMERGENCY DEPARTMENT Provider Note   CSN: [id number] Arrival date & time: 12/10/19  3295     History No chief complaint on file.   Natasha Smith is a 29 y.o. female with a history of sickle cell trait, prior STIs, and prior tubal ligation who presents to the emergency department with complaints of headache x 4 days and abdominal pain which has been occurring intermittently since her tubal ligation in 2019.  Patient states the headache had gradual onset with steady progression to the frontal region wrapping around to the back of the head.  She states initially she saw some black spots when the headache started but this is resolved.  States the pain is constant, 8 out of 10 in severity, worse with bright lights, no alleviating factors.  Tried Tylenol Motrin without much relief.  States she has a history of headaches in the past which have been similar including the floating vision prior to onset.  She denies numbness, tingling, weakness, dizziness, vision loss, fever, chills, or neck stiffness.  She also complains of intermittent lower abdominal pain since 2019 when she had her tubal ligation, states it last for a few days at a time, crampy in nature, worse around her menstrual cycle which she has already had this month.  She denies nausea, vomiting, diarrhea, melena, hematochezia, vaginal bleeding, vaginal discharge, dysuria, urgency, or frequency.  She is not currently sexually active and does not have concern for STIs.   HPI     Past Medical History:  Diagnosis Date  . Abnormal Pap smear   . Headache   . Herpes simplex type II infection   . History of chlamydia infection   . Infection    UTI  . Pregnancy induced hypertension    hx  . Sickle cell trait ([place])   . STD exposure 01/22/2019  . Trichomoniasis 01/22/2019    Patient Active Problem List   Diagnosis Date Noted  . Fall 12/07/2019  . H/O tubal ligation 08/21/2019  . Abnormal uterine bleeding (AUB)  01/22/2019  . Depression 11/13/2017  . Labor and delivery indication for care or intervention 10/28/2017  . Elevated BP without diagnosis of hypertension 10/13/2017  . Sickle cell trait ([place]) 04/16/2014  . 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 OBESITY 03/03/2008  . ECZEMA, ATOPIC DERMATITIS 09/07/2006    Past Surgical History:  Procedure Laterality Date  . KNEE SURGERY    . TUBAL LIGATION Bilateral 10/28/2017   Procedure: POST PARTUM TUBAL LIGATION;  Surgeon: Truett Mainland, DO;  Location: [facility];  Service: Gynecology;  Laterality: Bilateral;     OB History    Gravida  9   Para  5   Term  5   Preterm  0   AB  4   Living  5     SAB  3   TAB  1   Ectopic  0   Multiple  0   Live Births  5           Family History  Problem Relation Age of Onset  . Diabetes Maternal Grandmother   . Hypertension Mother   . Asthma Mother   . Heart disease Maternal Grandfather   . Asthma Sister   . Asthma Brother   . Cancer Maternal Aunt     Social History   Tobacco Use  . Smoking status: Former Research scientist (life sciences)  . Smokeless tobacco: Never Used  . Tobacco comment: March 2015  Substance Use  Topics  . Alcohol use: No  . Drug use: No    Home Medications Prior to Admission medications   Medication Sig Start Date End Date Taking? Authorizing Provider  Ferrous Sulfate 134 MG TABS Take 1 tablet (134 mg total) by mouth every other day. 10/31/19   Shirley, [place], DO  norgestimate-ethinyl estradiol (ORTHO-CYCLEN) 0.25-35 MG-MCG tablet Take 1 tablet by mouth daily. 10/31/19   Shirley, [place], DO    Allergies    Patient has no known allergies.  Review of Systems   Review of Systems  Constitutional: Negative for chills and fever.  Respiratory: Negative for cough and shortness of breath.   Cardiovascular: Negative for chest pain.  Gastrointestinal: Positive for abdominal pain. Negative for blood in stool, constipation, diarrhea, nausea and vomiting.    Genitourinary: Negative for dysuria, hematuria, vaginal bleeding and vaginal discharge.  Neurological: Positive for headaches. Negative for dizziness, seizures, syncope, facial asymmetry, speech difficulty, weakness, light-headedness and numbness.  All other systems reviewed and are negative.   Physical Exam Updated Vital Signs BP (!) 132/103 (BP Location: Left Arm)   Pulse 80   Temp 98.4 F (36.9 C) (Oral)   Resp 18   Ht 5\' 4"  (1.626 m)   Wt 90.7 kg   SpO2 98%   BMI 34.33 kg/m   Physical Exam Vitals and nursing note reviewed. Exam conducted with a chaperone present.  Constitutional:      General: She is not in acute distress.    Appearance: She is well-developed. She is not toxic-appearing.  HENT:     Head: Normocephalic and atraumatic.  Eyes:     General: Vision grossly intact. Gaze aligned appropriately.        Right eye: No discharge.        Left eye: No discharge.     Extraocular Movements: Extraocular movements intact.     Conjunctiva/sclera: Conjunctivae normal.     Comments: PERRL. No proptosis.   Cardiovascular:     Rate and Rhythm: Normal rate and regular rhythm.  Pulmonary:     Effort: Pulmonary effort is normal. No respiratory distress.     Breath sounds: Normal breath sounds. No wheezing, rhonchi or rales.  Abdominal:     General: There is no distension.     Palpations: Abdomen is soft.     Tenderness: There is abdominal tenderness (lower abdomen). There is no guarding or rebound. Negative signs include McBurney's sign.  Genitourinary:    Comments: Mild white vaginal discharge present.  No significant bleeding.  Patient diffusely tender throughout bimanual exam.  No palpable masses. Musculoskeletal:     Cervical back: Normal range of motion and neck supple. No rigidity.  Skin:    General: Skin is warm and dry.     Findings: No rash.  Neurological:     Comments: Alert. Clear speech. No facial droop. CNIII-XII grossly intact. Bilateral upper and lower  extremities' sensation grossly intact. 5/5 symmetric strength with grip strength and with plantar and dorsi flexion bilaterally . Normal finger to nose bilaterally. Negative pronator drift. Negative Romberg sign. Gait is steady and intact.   Psychiatric:        Behavior: Behavior normal.     ED Results / Procedures / Treatments   Labs (all labs ordered are listed, but only abnormal results are displayed) Labs Reviewed - No data to display  EKG None  Radiology PELVIC COMPLETE W TRANSVAGINAL AND TORSION R/O  Result Date: 12/10/2019 CLINICAL DATA:  General pelvic pain off and  on for 1 year, LMP 11/23/2019 EXAM: TRANSABDOMINAL AND TRANSVAGINAL ULTRASOUND OF PELVIS DOPPLER ULTRASOUND OF OVARIES TECHNIQUE: Both transabdominal and transvaginal ultrasound examinations of the pelvis were performed. Transabdominal technique was performed for global imaging of the pelvis including uterus, ovaries, adnexal regions, and pelvic cul-de-sac. It was necessary to proceed with endovaginal exam following the transabdominal exam to visualize the endometrium, lower uterine segment, and ovaries. Color and duplex Doppler ultrasound was utilized to evaluate blood flow to the ovaries. COMPARISON:  08/28/2019 FINDINGS: Uterus Measurements: 9.2 x 5.1 x 5.5 cm = volume: 135 mL. Anteverted. Prominent uterine vessels. No focal mass. Incidentally noted nabothian cyst at cervix. Endometrium Thickness: 12 mm.  No endometrial fluid or focal abnormality. Right ovary Measurements: 2.6 x 1.9 x 3.6 cm = volume: 9.0 mL. Normal morphology without mass. Internal blood flow present on color Doppler imaging. Left ovary Measurements: 3.3 x 1.7 x 1.7 cm = volume: 5.0 mL. Normal morphology without mass. Internal blood flow present on color Doppler imaging. Pulsed Doppler evaluation of both ovaries demonstrates normal low-resistance arterial and venous waveforms. Other findings No free pelvic fluid.  No adnexal masses. IMPRESSION: Normal exam.  No evidence of ovarian mass or torsion. Electronically Signed   By: Ulyses Southward M.D.   On: 12/10/2019 11:10    Procedures Procedures (including critical care time)  Medications Ordered in ED Medications  cefTRIAXone (ROCEPHIN) injection 500 mg (has no administration in time range)  sodium chloride 0.9 % bolus 500 mL (0 mLs Intravenous Stopped 12/10/19 1301)  prochlorperazine (COMPAZINE) injection 10 mg (10 mg Intravenous Given 12/10/19 0926)  diphenhydrAMINE (BENADRYL) injection 12.5 mg (12.5 mg Intravenous Given 12/10/19 6606)    ED Course  I have reviewed the triage vital signs and the nursing notes.  Pertinent labs & imaging results that were available during my care of the patient were reviewed by me and considered in my medical decision making (see chart for details).    MDM Rules/Calculators/A&P                     Patient presents to the ED with complaints of headache & abdominal pain. Nontoxic, vitals without significant abnormality, mildly elevated diastolic blood pressure, low suspicion for hypertensive emergency.  She has a normal neurologic exam.  No nuchal rigidity.  She is afebrile.  Lower abdominal tenderness noted, also has diffuse tenderness throughout bimanual exam with her pelvic..  Additional history obtained:  Additional history obtained from chart review & nursing note review.  Lab Tests:  I Ordered, reviewed, and interpreted labs, which included:  CBC: No significant leukocytosis. CMP: No significant electrolyte derangement.  Renal function preserved.  No elevation in LFTs. Urinalysis: Not consistent with UTI Pregnancy test: Negative Wet prep: Findings of bacterial vaginosis and trichomonas.  Imaging Studies ordered:  I ordered imaging studies which included pelvic US, I independently visualized and interpreted imaging which showed Normal exam. No evidence of ovarian mass or torsion.  ED Course:  Following initial assessment of the patient Compazine, Benadryl,  and fluids were ordered to treat her symptoms.  On reassessment patient is feeling much better, her pain is fairly resolved at this time.  She has a history of similar headaches, gradual onset, steady progression.  She remains without focal neurologic deficits, fever, nuchal rigidity, or visual disturbance to raise concern for [facility], ICH, ischemic CVA, dural venous sinus thrombosis, acute glaucoma, giant cell arteritis, mass, or meningitis.  In regards to her abdominal pain, repeat abdominal exam remains without  peritoneal signs, given she is positive for trichomonas with tenderness on bimanual exam we will treat for pelvic inflammatory disease with antibiotics.  We discussed the need to inform all sexual partners and the need for abstinence until completion of treatment.  She is afebrile without leukocytosis, does not appear septic, she seems appropriate for outpatient treatment at this time. I discussed results, treatment plan, need for follow-up, and return precautions with the patient. Provided opportunity for questions, patient confirmed understanding and is in agreement with plan.   Portions of this note were generated with Scientist, clinical (histocompatibility and immunogenetics). Dictation errors may occur despite best attempts at proofreading.  Final Clinical Impression(s) / ED Diagnoses Final diagnoses:  Acute nonintractable headache, unspecified headache type  PID (acute pelvic inflammatory disease)  Trichomonosis    Rx / DC Orders ED Discharge Orders         Ordered    doxycycline (VIBRAMYCIN) 100 MG capsule  2 times daily     12/10/19 1311    metroNIDAZOLE (FLAGYL) 500 MG tablet  2 times daily     12/10/19 1311    naproxen (NAPROSYN) 500 MG tablet  2 times daily PRN     12/10/19 1311    ondansetron (ZOFRAN ODT) 4 MG disintegrating tablet  Every 8 hours PRN     12/10/19 1311           Cheyenne Bordeaux, [place] R, PA-C 12/10/19 1317    Sabas Sous, MD 12/16/19 1258

## 2019-12-11 LAB — GC/CHLAMYDIA PROBE AMP (~~LOC~~) NOT AT ARMC
Chlamydia: POSITIVE — AB
Comment: NEGATIVE
Comment: NORMAL
Neisseria Gonorrhea: NEGATIVE

## 2019-12-11 LAB — RPR: RPR Ser Ql: NONREACTIVE

## 2019-12-31 ENCOUNTER — Other Ambulatory Visit: Payer: Self-pay

## 2019-12-31 ENCOUNTER — Ambulatory Visit (INDEPENDENT_AMBULATORY_CARE_PROVIDER_SITE_OTHER): Payer: Medicaid Other | Admitting: Family Medicine

## 2019-12-31 VITALS — BP 124/76 | HR 94 | Ht 64.0 in | Wt 204.0 lb

## 2019-12-31 DIAGNOSIS — T7840XA Allergy, unspecified, initial encounter: Secondary | ICD-10-CM | POA: Diagnosis not present

## 2019-12-31 DIAGNOSIS — F32A Depression, unspecified: Secondary | ICD-10-CM

## 2019-12-31 DIAGNOSIS — F329 Major depressive disorder, single episode, unspecified: Secondary | ICD-10-CM

## 2019-12-31 MED ORDER — LORATADINE 10 MG PO TABS: 10.0000 mg | ORAL_TABLET | Freq: Every day | ORAL | 0 refills | Status: DC

## 2019-12-31 MED ORDER — HYDROCORTISONE 0.5 % EX OINT: 1.0000 "application " | TOPICAL_OINTMENT | Freq: Two times a day (BID) | CUTANEOUS | 0 refills | Status: DC

## 2019-12-31 NOTE — Patient Instructions (Signed)
Today we took a look at the bumps on your hip and side leg.  I think this is likely an allergic reaction and not sure if this is due to the heat issue that you mentioned or if maybe a bug bit you but it does appear to be minor and so do not have any breathing problems or having history of anaphylactic issues we should probably be okay with a short course of a steroid cream and some antihistamine.  Some folks find that cool rags will help calm down itching but this should make everything calm down over the next week or 2.  If something get significantly worse or you have any trouble breathing or swallowing please get to the emergency department immediately but I think that will be very unlikely.  As we discussed that you are dealing with some depression issues and some stress issues in her life, I am glad you are safe but were going to go ahead and give you this list of therapy resources here in town that you might find helpful.  Feel free to call around and see which ones work with your schedule/personality/budget.  As always if anything happens that means you are unsafe please get to emergency department immediately.  Let us know if you need anything Dr. Parke Simmers      Therapy and Counseling Resources Most providers on this list will take Medicaid. Patients with commercial insurance or Medicare should contact their insurance company to get a list of in network providers.  [facility]  [address] [place], [place] [address]      [phone]  [facility] [address]  [place], [place] [address] [phone]  [facility] [address]  [place], [place] [address]       [phone]      [facility] [address] [place], [place] [phone]  [facility]:  [address] [place] [phone]  [facility]:  [address] [place], [place]  [phone]  [facility] (under & uninsured) [address]   [place] [phone]    [email or web address]    [facility] [place] -[address]     Phone:  [phone]     [facility]-  [address]  [phone]   [facility] [address]      [place], [place] [phone]  [facility]: [address] [place], [place], [place]  [phone]   [facility] (Spanish therapist) [address] [place]  [address]   [place] [address]    [phone]    [facility]   [address],  [place], [place]  [phone]   [facility]  [address] [place]  [phone]  [facility]  [address] [place], [place]        [phone]  [facility] under & uninsured  [place], To schedule an appointment call [phone] [address] [place] [phone]):  Macario Golds from 8 AM - 3 PM Moving June 1 to [facility] at [facility] [address]  [facility] [address],  (Spanish)   [address], [place]: [phone]) 8:30 - 12; 1 - 2:30  [facility] [employer],  [address] [place], [place]    (  [phone]):8:30 - 12; 2 - 3PM  [facility] [address]  [address],  [place]; [phone]):   Mon - Fri 8 AM - 5 PM  [facility] [place]  MWF 12:30 to 3:00 or call to schedule an appointment  [phone]  Specific Provider options Psychology Today  [email or web address]/us 1. click on find a therapist  2. enter your zip code 3. left side and select or tailor a therapist for your specific need.   [facility] Provider Directory [email or web address]  (Medicaid)   Follow all drop down to find a provider  [facility] [phone] or [email or web address] [address], [place], [place] Recovery support and educational   24- Hour Availability:  . [place] or [phone]  . [facility] [employer] [phone]  [facility] Crisis Service  [phone]   . [place]  [phone] (after hours)  . [facility] Alternative/Mobile Crisis   [phone]  . [place] National Suicide Hotline  [phone] (Central)  . Call 911 or go to emergency room  . [employer]  [phone]);  [place] and [employer]   . [facility]  [phone]); [place], [place], [place], [place], [place], [place], [place]

## 2020-01-01 NOTE — Assessment & Plan Note (Signed)
Patient has a few scattered spots of raised redness and itchiness along right hip and lateral right thigh.  No known etiology.  She is not had an issue like this in the past.  She does not have a history of any anaphylactic reactions and has had no airway involvement.  She has had no fevers or other known illnesses lately.  She said she was out in the heat quite a bit the last couple days and wonders if there is a chance heat to be involved  Allergic reaction of unknown etiology.  Will give 1 month course of antihistamine and short course of hydrocortisone ointment.  We did discuss that while this is a short course of low-dose steroid cream that longer courses of high-dose steroid cream do have a potential for skin bleaching.  Patient understands and return precautions were discussed

## 2020-01-01 NOTE — Assessment & Plan Note (Signed)
Patient said that she has been feeling down and overwhelmed lately.  She is safe and has no thoughts of suicidal/homicidal ideation.  Says she would never do anything to herself because she has a daughter to take care of.  Has used medication in the past does not want to start medication at this time, would like to start with therapy to work through some things.  Accepts offer of therapy resource list here in the area, return precautions discussed

## 2020-01-01 NOTE — Progress Notes (Signed)
° ° °  SUBJECTIVE:   CHIEF COMPLAINT / HPI:   Allergic reaction Patient has a few scattered spots of raised redness and itchiness along right hip and lateral right thigh.  No known etiology.  She is not had an issue like this in the past.  She does not have a history of any anaphylactic reactions and has had no airway involvement.  She has had no fevers or other known illnesses lately.  She said she was out in the heat quite a bit the last couple days and wonders if there is a chance heat to be involved  Depression Patient said that she has been feeling down and overwhelmed lately.  She is safe and has no thoughts of suicidal/homicidal ideation.  Says she would never do anything to herself because she has a daughter to take care of.  Has used medication in the past does not want to start medication at this time, would like to start with therapy to work through some things.  PERTINENT  PMH / PSH:   OBJECTIVE:   BP 124/76    Pulse 94    Ht 5\' 4"  (1.626 m)    Wt 204 lb (92.5 kg)    LMP 12/23/2019 (Exact Date)    SpO2 100%    BMI 35.02 kg/m   General: Alert and pleasant Skin exam: *Performed with April CMA in the room*5-6 red slightly raised lesions consistent with urticaria, nonpustular/draining, placed randomly along right lateral side from mid thigh up to mid abdomen.  There are no obvious breaks in the skin or identifiable patterns.  Nonpainful but itchy  ASSESSMENT/PLAN:   Allergic reaction Patient has a few scattered spots of raised redness and itchiness along right hip and lateral right thigh.  No known etiology.  She is not had an issue like this in the past.  She does not have a history of any anaphylactic reactions and has had no airway involvement.  She has had no fevers or other known illnesses lately.  She said she was out in the heat quite a bit the last couple days and wonders if there is a chance heat to be involved  Allergic reaction of unknown etiology.  Will give 1 month course  of antihistamine and short course of hydrocortisone ointment.  We did discuss that while this is a short course of low-dose steroid cream that longer courses of high-dose steroid cream do have a potential for skin bleaching.  Patient understands and return precautions were discussed  Depression Patient said that she has been feeling down and overwhelmed lately.  She is safe and has no thoughts of suicidal/homicidal ideation.  Says she would never do anything to herself because she has a daughter to take care of.  Has used medication in the past does not want to start medication at this time, would like to start with therapy to work through some things.  Accepts offer of therapy resource list here in the area, return precautions discussed     [place], DO [facility]

## 2020-04-17 ENCOUNTER — Other Ambulatory Visit: Payer: Self-pay

## 2020-04-17 ENCOUNTER — Other Ambulatory Visit: Payer: Medicaid Other

## 2020-04-17 DIAGNOSIS — Z20822 Contact with and (suspected) exposure to covid-19: Secondary | ICD-10-CM | POA: Diagnosis not present

## 2020-04-18 LAB — SARS-COV-2, NAA 2 DAY TAT

## 2020-04-18 LAB — NOVEL CORONAVIRUS, NAA: SARS-CoV-2, NAA: NOT DETECTED

## 2020-04-22 ENCOUNTER — Other Ambulatory Visit: Payer: Self-pay

## 2020-04-22 ENCOUNTER — Other Ambulatory Visit: Payer: Medicaid Other

## 2020-04-22 DIAGNOSIS — Z20822 Contact with and (suspected) exposure to covid-19: Secondary | ICD-10-CM

## 2020-04-23 LAB — SPECIMEN STATUS REPORT

## 2020-04-23 LAB — SARS-COV-2, NAA 2 DAY TAT

## 2020-04-23 LAB — NOVEL CORONAVIRUS, NAA: SARS-CoV-2, NAA: NOT DETECTED

## 2020-06-16 ENCOUNTER — Other Ambulatory Visit: Payer: Self-pay

## 2020-06-16 ENCOUNTER — Other Ambulatory Visit: Payer: Medicaid Other

## 2020-06-16 DIAGNOSIS — Z20822 Contact with and (suspected) exposure to covid-19: Secondary | ICD-10-CM | POA: Diagnosis not present

## 2020-06-18 LAB — NOVEL CORONAVIRUS, NAA: SARS-CoV-2, NAA: NOT DETECTED

## 2020-06-18 LAB — SARS-COV-2, NAA 2 DAY TAT

## 2020-06-18 LAB — SPECIMEN STATUS REPORT

## 2020-07-08 ENCOUNTER — Other Ambulatory Visit: Payer: Self-pay

## 2020-07-08 ENCOUNTER — Encounter: Payer: Self-pay | Admitting: Family Medicine

## 2020-07-08 ENCOUNTER — Ambulatory Visit (INDEPENDENT_AMBULATORY_CARE_PROVIDER_SITE_OTHER): Payer: Medicaid Other | Admitting: Family Medicine

## 2020-07-08 DIAGNOSIS — R519 Headache, unspecified: Secondary | ICD-10-CM | POA: Diagnosis present

## 2020-07-08 DIAGNOSIS — R29818 Other symptoms and signs involving the nervous system: Secondary | ICD-10-CM | POA: Diagnosis not present

## 2020-07-08 NOTE — Patient Instructions (Signed)
Please go to the ED for full evaluation with new numbness and weakness in right leg

## 2020-07-08 NOTE — Progress Notes (Addendum)
    SUBJECTIVE:   CHIEF COMPLAINT / HPI: HA  HEADACHE: patient with history of migraines, HA started last night, but different today with pain and numbness in right leg. Denies any sick contacts, no fever, CP, SOB, diarrhea, joint swelling, rashes. Not vaccinated for flu or COVID-19.  Onset: last night Location: frontal, bilateral Quality: dull, throbbing Frequency: constant Precipitating factors: none Prior treatment: excedrine and tylenol, minimal improvement  Associated Symptoms Nausea/vomiting: yes, 1 episode of emesis  Photophobia/phonophobia: no  Tearing of eyes: no  Sinus pain/pressure: no  Family hx migraine: no  Personal stressors: no  Relation to menstrual cycle: no   Red Flags Fever: no  Neck pain/stiffness: no  Vision/speech/swallow/hearing difficulty: no  Focal weakness/numbness: yes  Altered mental status: no  Trauma: no  New type of headache: yes, HA is same, but finding of numbness and tingling of right leg is new  Anticoagulant use: no  H/o cancer/HIV/Pregnancy: no    PERTINENT  PMH / PSH: h/o HA, AUB, anemia  OBJECTIVE:   BP 128/80   Pulse 79   Ht 5\' 6"  (1.676 m)   Wt 200 lb 6.4 oz (90.9 kg)   LMP 06/10/2020 (Approximate)   SpO2 100%   BMI 32.35 kg/m   Nursing note and vitals reviewed GEN: tired-appearing AAF resting comfortably in chair, NAD, obese HEENT: NCAT. PERRLA. Sclera without injection or icterus. MMM. Clear oropharynx, erythematous sinus passages. TMs non erythematous non bulging bilaterally. Neck: Supple. No LAD Cardiac: Regular rate and rhythm. Normal S1/S2. No murmurs, rubs, or gallops appreciated. 2+ radial pulses. Lungs: Clear bilaterally to ascultation. No increased WOB, no accessory muscle usage. No w/r/r. Abdomen: Normoactive bowel sounds. No tenderness to deep or light palpation. No rebound or guarding.   Neuro: AOx3  Cranial Nerves: II: Visual Fields are full. .  III,IV, VI: EOMI without ptosis or diplopia.  V: Facial  sensation is symmetric to touch VII: Facial movement is symmetric.  VIII: hearing is intact to voice X: Palate elevates symmetrically XI: Shoulder shrug is symmetric. XII: tongue is midline without atrophy or fasciculations.  Motor: Tone is normal. Bulk is normal. 5/5 strength was present in RUE, LUE, LLE. 3/5 strength present in RLE. Sensory: Sensation is asymmetric to light touch, diminished sensation in RLE from knee down Deep Tendon Reflexes: 2+ and symmetric in the biceps and patellae.  Cerebellar: FNF intact, unable to perform HKS with R leg Kernig's sign negative Gait: observed with difficulty dorsiflexing right foot, dragging right foot Ext: no edema Psych: Pleasant and appropriate   ASSESSMENT/PLAN:   Headache with neurologic deficit Patient with h/o migraine HA but new finding of neurologic deficit of diminished strength and sensation in RLL. Observed with gait not clearing right foot from ground. DDx broad, but includes complex migraine vs stroke. No fever or other signs of meningeal irritation, full ROM of cervical spine, kernig's negative. No trauma. Patient is unvaccinated for COVID, but no other infectious signs or symptoms. Discussed with patient that with new neurologic deficit, best course of action is to be evaluated in the ED with imaging, may need IV treatment if indeed complex migraine. Patient in agreement with plan.     14/07/2019, MD [facility]

## 2020-07-08 NOTE — Assessment & Plan Note (Signed)
Patient with h/o migraine HA but new finding of neurologic deficit of diminished strength and sensation in RLL. Observed with gait not clearing right foot from ground. DDx broad, but includes complex migraine vs stroke. No fever or other signs of meningeal irritation, full ROM of cervical spine, kernig's negative. No trauma. Patient is unvaccinated for COVID, but no other infectious signs or symptoms. Discussed with patient that with new neurologic deficit, best course of action is to be evaluated in the ED with imaging, may need IV treatment if indeed complex migraine. Patient in agreement with plan.

## 2020-07-16 ENCOUNTER — Other Ambulatory Visit: Payer: Medicaid Other

## 2020-07-24 ENCOUNTER — Other Ambulatory Visit: Payer: Medicaid Other

## 2020-08-13 ENCOUNTER — Other Ambulatory Visit: Payer: Self-pay

## 2020-08-13 ENCOUNTER — Ambulatory Visit (INDEPENDENT_AMBULATORY_CARE_PROVIDER_SITE_OTHER): Payer: Medicaid Other | Admitting: Family Medicine

## 2020-08-13 VITALS — BP 116/80 | HR 88 | Ht 66.0 in | Wt 211.2 lb

## 2020-08-13 DIAGNOSIS — Z9851 Tubal ligation status: Secondary | ICD-10-CM

## 2020-08-13 DIAGNOSIS — Z862 Personal history of diseases of the blood and blood-forming organs and certain disorders involving the immune mechanism: Secondary | ICD-10-CM | POA: Diagnosis not present

## 2020-08-13 DIAGNOSIS — N939 Abnormal uterine and vaginal bleeding, unspecified: Secondary | ICD-10-CM

## 2020-08-13 DIAGNOSIS — G43909 Migraine, unspecified, not intractable, without status migrainosus: Secondary | ICD-10-CM | POA: Insufficient documentation

## 2020-08-13 DIAGNOSIS — G43829 Menstrual migraine, not intractable, without status migrainosus: Secondary | ICD-10-CM

## 2020-08-13 DIAGNOSIS — Z0000 Encounter for general adult medical examination without abnormal findings: Secondary | ICD-10-CM | POA: Diagnosis not present

## 2020-08-13 MED ORDER — NAPROXEN 500 MG PO TABS: 500.0000 mg | ORAL_TABLET | Freq: Two times a day (BID) | ORAL | 1 refills | Status: DC | PRN

## 2020-08-13 NOTE — Assessment & Plan Note (Addendum)
Appears to be hormonally related, without aura.  Fortunately neurologically intact on exam.  Trial of naproxen as discussed above for menorrhagia, likely to benefit headaches as well.  Additionally recommended starting B6/magnesium as preventative measure with adequate hydration.  May add on Tylenol as needed.  Could consider abortive therapy such as Imitrex if not controlled with the above management.

## 2020-08-13 NOTE — Assessment & Plan Note (Signed)
Chronic, unchanged. Previous work-up including pelvic U/S unremarkable.  Discussed initial options including NSAID trial, hormonal contraception.  Opted to try NSAIDs at onset of menstrual cycle through completion as she has not tried this previously.  Additionally she is interested in meeting with fertility to conceive, thus will hold off on OCPs for management.  Rx naproxen, 500 mg twice daily during cycle.

## 2020-08-13 NOTE — Patient Instructions (Signed)
It was wonderful to see you today.  To help prevent headaches, you can start taking a B6 and magnesium supplement, taking a multivitamin with both of these and it would also be helpful.  Be sure to start low on magnesium usually around 200 mg as this sometimes can cause GI upset and diarrhea.  For your headaches and menstrual cycle: Naproxen 500 mg at onset of period and three to five hours later, then 250 to 500 mg twice a day  You can also go ahead and call [facility] to schedule an appointment.  Most, if not all, is not covered by Medicaid.  Let us know if you need a referral placed after calling.  [address] [place], [place] [address] Phone: [phone] or [phone]

## 2020-08-13 NOTE — Progress Notes (Signed)
SUBJECTIVE:   CHIEF COMPLAINT / HPI: heavy cycles/headaches  Natasha Smith is a 30 year old female, G9P5, presenting for evaluation of abnormal uterine bleeding and headaches around her menstrual cycle.  AUB: Evaluated several times for this in the past, chronic/years and not worsening in severity.  On her cycle currently, LMP 1/30.  Current tubal ligation since 10/2017.  TSH WNL in 2021.  Pelvic U/S unremarkable in 12/2019.  Reports heavy cycles monthly, may be slightly irregular and the fact that it varies on onset between 1-2 weeks each month.  Usually last 7 days.  Requires both tampons and pads been frequently changed due to flow.  Denies any lightheadedness/dizziness, does feel fatigued occasionally.  Known history of iron deficiency anemia, states she is out of iron pills.  Prescribed OCPs twice last year to improve bleeding, never started taking it.  Headaches: Chronic/years without increase in severity/frequency.  Onset when her menstrual cycle initially comes on and at the end.  Mainly in the middle of her forehead, throbbing.  Associated photophobia, phonophobia, and nausea.  Has been using Excedrin headache OTC with minimal help.  Usually has to sleep her headaches off, left work early yesterday due to her headaches.  Denies any dizziness, visual changes, numbness, or weakness.  No known aura, however can feel her headache come on.   Desire to conceive: She had a tubal ligation after her fifth vaginal delivery in 2019, however desires more children.  She was referred to infertility last year, however never heard from their office.  PERTINENT  PMH / PSH: AUB, HSV-2, sickle cell trait  OBJECTIVE:   BP 116/80   Pulse 88   Ht 5\' 6"  (1.676 m)   Wt 211 lb 3.2 oz (95.8 kg)   LMP 08/09/2020 (Approximate)   SpO2 97%   BMI 34.09 kg/m   General: Alert, NAD HEENT: NCAT, MMM Cardiac: RRR no m/g/r Lungs: Clear bilaterally, no increased WOB  Msk: Moves all extremities spontaneously  Ext:  Warm, dry, 2+ distal pulses Neuro: Alert and oriented.  CN II-XII intact.  EOMI, PERRLA.  5/5 upper and lower extremity strength.  Sensation to light touch intact throughout.  Normal gait.  Follows normal conversation appropriately.  ASSESSMENT/PLAN:   Abnormal uterine bleeding Chronic, unchanged. Previous work-up including pelvic U/S unremarkable.  Discussed initial options including NSAID trial, hormonal contraception.  Opted to try NSAIDs at onset of menstrual cycle through completion as she has not tried this previously.  Additionally she is interested in meeting with fertility to conceive, thus will hold off on OCPs for management.  Rx naproxen, 500 mg twice daily during cycle.  Menstrual migraine Appears to be hormonally related, without aura.  Fortunately neurologically intact on exam.  Trial of naproxen as discussed above for menorrhagia, likely to benefit headaches as well.  Additionally recommended starting B6/magnesium as preventative measure with adequate hydration.  May add on Tylenol as needed.  Could consider abortive therapy such as Imitrex if not controlled with the above management.   H/O tubal ligation 10/2017, however patient desires to conceive again.  Provided contact information to 11/2017 infertility in [place] to schedule an appointment.  Patient is aware that this is often costly and out-of-pocket.  Call our office if requires referral.  Healthcare maintenance Due for her Pap smear, last WNL in 01/2017.  Discussed performing this today, however patient would rather wait until next week when she is feeling better.  Schedule appointment at convenience.  History of anemia Previous iron deficiency anemia in  setting of known menorrhagia as above.  Hemoglobin WNL in 08/2019, will recheck today.  Will likely Rx ferrous sulfate pending results.    Follow-up if not improving or sooner if worsening.  Allayne Stack, DO [facility]

## 2020-08-13 NOTE — Assessment & Plan Note (Signed)
10/2017, however patient desires to conceive again.  Provided contact information to [facility] in [place] to schedule an appointment.  Patient is aware that this is often costly and out-of-pocket.  Call our office if requires referral.

## 2020-08-13 NOTE — Assessment & Plan Note (Signed)
Due for her Pap smear, last WNL in 01/2017.  Discussed performing this today, however patient would rather wait until next week when she is feeling better.  Schedule appointment at convenience.

## 2020-08-13 NOTE — Assessment & Plan Note (Signed)
Previous iron deficiency anemia in setting of known menorrhagia as above.  Hemoglobin WNL in 08/2019, will recheck today.  Will likely Rx ferrous sulfate pending results.

## 2020-08-14 LAB — CBC
Hematocrit: 39.5 % (ref 34.0–46.6)
Hemoglobin: 12.6 g/dL (ref 11.1–15.9)
MCH: 25.8 pg — ABNORMAL LOW (ref 26.6–33.0)
MCHC: 31.9 g/dL (ref 31.5–35.7)
MCV: 81 fL (ref 79–97)
Platelets: 264 10*3/uL (ref 150–450)
RBC: 4.89 x10E6/uL (ref 3.77–5.28)
RDW: 14.3 % (ref 11.7–15.4)
WBC: 12.8 10*3/uL — ABNORMAL HIGH (ref 3.4–10.8)

## 2020-08-16 ENCOUNTER — Ambulatory Visit
Admission: EM | Admit: 2020-08-16 | Discharge: 2020-08-16 | Disposition: A | Discharge status: Home / Self Care | Payer: Medicaid Other | Attending: Family Medicine | Admitting: Family Medicine

## 2020-08-16 ENCOUNTER — Other Ambulatory Visit: Payer: Self-pay

## 2020-08-16 ENCOUNTER — Encounter: Payer: Self-pay | Admitting: Emergency Medicine

## 2020-08-16 DIAGNOSIS — M546 Pain in thoracic spine: Secondary | ICD-10-CM | POA: Diagnosis not present

## 2020-08-16 DIAGNOSIS — T148XXA Other injury of unspecified body region, initial encounter: Secondary | ICD-10-CM

## 2020-08-16 DIAGNOSIS — M542 Cervicalgia: Secondary | ICD-10-CM | POA: Diagnosis not present

## 2020-08-16 MED ORDER — IBUPROFEN 800 MG PO TABS: 800.0000 mg | ORAL_TABLET | Freq: Three times a day (TID) | ORAL | 0 refills | Status: DC | PRN

## 2020-08-16 MED ORDER — CYCLOBENZAPRINE HCL 10 MG PO TABS
10.0000 mg | ORAL_TABLET | Freq: Two times a day (BID) | ORAL | 0 refills | Status: DC | PRN
Start: 2020-08-16 — End: 2022-02-14

## 2020-08-16 MED ORDER — DEXAMETHASONE SODIUM PHOSPHATE 10 MG/ML IJ SOLN
10.0000 mg | Freq: Once | INTRAMUSCULAR | Status: AC
  Administered 2020-08-16: 10 mg via INTRAMUSCULAR

## 2020-08-16 MED ORDER — KETOROLAC TROMETHAMINE 30 MG/ML IJ SOLN
30.0000 mg | Freq: Once | INTRAMUSCULAR | Status: AC
  Administered 2020-08-16: 30 mg via INTRAMUSCULAR

## 2020-08-16 NOTE — ED Triage Notes (Signed)
[facility]
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
## 2020-08-16 NOTE — Discharge Instructions (Signed)
You have received toradol and decadron as injections in the office to help with pain and inflammation  I have sent in ibuprofen for you to take one tablet every 8 hours as needed for pain and inflammation.  I have sent in flexeril for you to take twice a day as needed for muscle spasms. This medication can make you sleepy. Do not drive or operate heavy machinery with this medication.  Follow up with your primary care provider or an orthopedist if your pain is not improving.

## 2020-12-16 ENCOUNTER — Ambulatory Visit: Payer: Medicaid Other

## 2020-12-24 ENCOUNTER — Ambulatory Visit: Payer: Medicaid Other | Admitting: Family Medicine

## 2020-12-24 NOTE — Progress Notes (Deleted)
    SUBJECTIVE:   CHIEF COMPLAINT / HPI:   ***  PERTINENT  PMH / PSH: ***  OBJECTIVE:   There were no vitals taken for this visit.  ***  ASSESSMENT/PLAN:   No problem-specific Assessment & Plan notes found for this encounter.     Shaylan Tutton, MD [facility]   {    This will disappear when note is signed, click to select method of visit    :1} 

## 2021-01-08 DIAGNOSIS — Z20828 Contact with and (suspected) exposure to other viral communicable diseases: Secondary | ICD-10-CM | POA: Diagnosis not present

## 2021-01-13 ENCOUNTER — Other Ambulatory Visit (HOSPITAL_COMMUNITY)
Admission: RE | Admit: 2021-01-13 | Discharge: 2021-01-13 | Disposition: A | Discharge status: Home / Self Care | Payer: Medicaid Other | Source: Ambulatory Visit | Attending: Family Medicine | Admitting: Family Medicine

## 2021-01-13 ENCOUNTER — Telehealth: Payer: Self-pay

## 2021-01-13 ENCOUNTER — Encounter: Payer: Self-pay | Admitting: Family Medicine

## 2021-01-13 ENCOUNTER — Ambulatory Visit (INDEPENDENT_AMBULATORY_CARE_PROVIDER_SITE_OTHER): Payer: Medicaid Other | Admitting: Family Medicine

## 2021-01-13 ENCOUNTER — Other Ambulatory Visit: Payer: Self-pay

## 2021-01-13 VITALS — BP 120/80 | HR 83 | Ht 66.0 in | Wt 207.0 lb

## 2021-01-13 DIAGNOSIS — Z202 Contact with and (suspected) exposure to infections with a predominantly sexual mode of transmission: Secondary | ICD-10-CM

## 2021-01-13 LAB — POCT WET PREP (WET MOUNT): Clue Cells Wet Prep Whiff POC: POSITIVE

## 2021-01-13 MED ORDER — NORGESTIMATE-ETH ESTRADIOL 0.25-35 MG-MCG PO TABS: 1.0000 | ORAL_TABLET | Freq: Every day | ORAL | 3 refills | Status: DC

## 2021-01-13 MED ORDER — METRONIDAZOLE 500 MG PO TABS: 500.0000 mg | ORAL_TABLET | Freq: Two times a day (BID) | ORAL | 0 refills | Status: AC

## 2021-01-13 NOTE — Telephone Encounter (Signed)
Pt called back in requesting that Dr. Idalia Needle call her back @ [phone] to go over results again with her partner.

## 2021-01-13 NOTE — Patient Instructions (Signed)
It was great seeing you today! Today we performed your pap smear and also did STI testing. I will call you with any abnormal results and send in treatment if needed.   I have also sent in oral contraceptives to help regulate your menstrual cycles.    Regarding lab work today:  Due to recent changes in healthcare laws, you may see the results of your imaging and laboratory studies on MyChart before your provider has had a chance to review them.  I understand that in some cases there may be results that are confusing or concerning to you. Not all laboratory results come back in the same time frame and you may be waiting for multiple results in order to interpret others.  Please give [place] 72 hours in order for your provider to thoroughly review all the results before contacting the office for clarification of your results. If everything is normal, you will get a letter in the mail or a message in My Chart. Please give [place] a call if you do not hear from [place] after 2 weeks.   Feel free to call with any questions or concerns at any time, at [phone].   Take care,  Dr. Cora Collum [facility]

## 2021-01-13 NOTE — Progress Notes (Signed)
    SUBJECTIVE:   CHIEF COMPLAINT / HPI:   Vaginal Discharge: Patient is a 30 y.o. female presenting with vaginal discharge for about 7 days. Denies pain or burning when urinating. She is interested in screening for sexually transmitted infections today. She also endorses her partner being symptomatic and having a burning sensation. Would be interested in treatment for him if applicable.   Of note, does have a history of HSV2. Denies any outbreaks. Not taking medication.   Also, patient endorses menstrual cycles lasting 7 days regularly every month and interested in hormonal contraception for decreasing her bleeding. Does have hx of tubal ligation.   PERTINENT  PMH / PSH:  HSV2, tubal ligation   OBJECTIVE:   BP 120/80   Pulse 83   Ht 5\' 6"  (1.676 m)   Wt 207 lb (93.9 kg)   LMP 12/09/2020   SpO2 97%   BMI 33.41 kg/m    General: NAD, pleasant, able to participate in exam CV: RRR no murmurs  Respiratory: CTAB normal WOB  Pelvic: VULVA: normal appearing vulva with no masses, tenderness or lesions, VAGINA: Normal appearing vagina with normal color, no lesions, with scant, white, and thin discharge present, CERVIX: No lesions, scant, white, and thin discharge present along with minimal blood at cervical os  Chaperone Jessica CMA present for pelvic exam  ASSESSMENT/PLAN:   No problem-specific Assessment & Plan notes found for this encounter.   Vaginal discharge c/f STI Patient presents with vaginal discharge for 7 days. Endorses new partner. Physical exam significant for clear/white discharge. Patient is interested in STI screening and partner therapy if applicable. Wet prep performed today and shows clue cells, bacteria, and trichomonas consistent with Bacterial vaginosis and Trichomonas infection.   -Wet prep as above.  Will treat with Flagyl 500mg  BID x 7 days. -EPT sent to [facility] pharmacy  -GC/chlamydia pending -Will check HIV and RPR as well as Hep C  -Pap smear    Menorrhagia  Patient endorses regular menses lasting 7 days and interested in OCPs. Previous workup including pelvic unremarkable.   - prescribed Sprintec   [facility], DO [facility]

## 2021-01-14 LAB — CERVICOVAGINAL ANCILLARY ONLY
Chlamydia: POSITIVE — AB
Comment: NEGATIVE
Comment: NORMAL
Neisseria Gonorrhea: NEGATIVE

## 2021-01-14 LAB — RPR: RPR Ser Ql: NONREACTIVE

## 2021-01-14 LAB — HIV ANTIBODY (ROUTINE TESTING W REFLEX): HIV Screen 4th Generation wRfx: NONREACTIVE

## 2021-01-14 LAB — HCV INTERPRETATION

## 2021-01-14 LAB — HCV AB W REFLEX TO QUANT PCR: HCV Ab: 0.1 s/co ratio (ref 0.0–0.9)

## 2021-01-15 ENCOUNTER — Telehealth: Payer: Self-pay | Admitting: Family Medicine

## 2021-01-15 ENCOUNTER — Other Ambulatory Visit: Payer: Self-pay | Admitting: Family Medicine

## 2021-01-15 MED ORDER — AZITHROMYCIN 500 MG PO TABS: 1000.0000 mg | ORAL_TABLET | Freq: Every day | ORAL | 0 refills | Status: DC

## 2021-01-15 NOTE — Telephone Encounter (Signed)
Please clarify directions. Rx stats to take 2 tablet for one day and then 1 tablet for 3 days. However, only two pills were prescribed.   Please advise.   Veronda Prude, RN

## 2021-01-15 NOTE — Telephone Encounter (Signed)
Called patient to discuss Chlamydia results with her. I had previously spoken to her on 7/6 about the positive Trichomonas and BV and sent in Flagyl at that time. Sending in Azithromycin as well. I updated expedited partner therapy form with the additional prescription. Patient's partner was with her and she asked that I talk to him about the results and I shared with both of them how to take the medication and to abstain from sex for 7 days after treatment and/or symptoms. Also recommended STI testing in 3 months. Provided the number to [facility] outpatient pharmacy to check on prescription for partner. Patient's medication sent to her pharmacy. Answered all of their questions.

## 2021-01-15 NOTE — Telephone Encounter (Signed)
Patient returns call to nurse line requesting to speak with Dr. Idalia Needle regarding results and treatment.   Please advise.   Veronda Prude, RN

## 2021-01-19 LAB — CYTOLOGY - PAP: Diagnosis: NEGATIVE

## 2021-01-25 ENCOUNTER — Telehealth: Payer: Self-pay

## 2021-01-25 MED ORDER — AZITHROMYCIN 500 MG PO TABS: 1000.0000 mg | ORAL_TABLET | Freq: Every day | ORAL | 0 refills | Status: DC

## 2021-01-25 MED ORDER — AZITHROMYCIN 500 MG PO TABS: 1000.0000 mg | ORAL_TABLET | Freq: Every day | ORAL | 0 refills | Status: AC

## 2021-01-25 NOTE — Telephone Encounter (Signed)
Patient calls nurse stating pharmacy still has not received Azithromycin prescription. Looks like it has been "setting to print." I have fixed the prescription and resent to pharmacy. The patient has been updated.

## 2021-03-18 ENCOUNTER — Ambulatory Visit: Payer: Medicaid Other | Admitting: Family Medicine

## 2021-06-15 ENCOUNTER — Ambulatory Visit: Payer: Medicaid Other

## 2021-06-21 ENCOUNTER — Ambulatory Visit: Payer: Medicaid Other | Admitting: Family Medicine

## 2021-06-22 ENCOUNTER — Ambulatory Visit (INDEPENDENT_AMBULATORY_CARE_PROVIDER_SITE_OTHER): Payer: Medicaid Other | Admitting: Family Medicine

## 2021-06-22 ENCOUNTER — Other Ambulatory Visit: Payer: Self-pay

## 2021-06-22 ENCOUNTER — Other Ambulatory Visit (HOSPITAL_COMMUNITY)
Admission: RE | Admit: 2021-06-22 | Discharge: 2021-06-22 | Disposition: A | Discharge status: Home / Self Care | Payer: Medicaid Other | Source: Ambulatory Visit | Attending: Family Medicine | Admitting: Family Medicine

## 2021-06-22 VITALS — BP 124/70 | HR 94 | Wt 209.0 lb

## 2021-06-22 DIAGNOSIS — B9689 Other specified bacterial agents as the cause of diseases classified elsewhere: Secondary | ICD-10-CM

## 2021-06-22 DIAGNOSIS — Z113 Encounter for screening for infections with a predominantly sexual mode of transmission: Secondary | ICD-10-CM

## 2021-06-22 DIAGNOSIS — N760 Acute vaginitis: Secondary | ICD-10-CM | POA: Diagnosis not present

## 2021-06-22 DIAGNOSIS — Z202 Contact with and (suspected) exposure to infections with a predominantly sexual mode of transmission: Secondary | ICD-10-CM | POA: Diagnosis not present

## 2021-06-22 LAB — POCT WET PREP (WET MOUNT)
Clue Cells Wet Prep Whiff POC: POSITIVE
Trichomonas Wet Prep HPF POC: ABSENT

## 2021-06-22 NOTE — Patient Instructions (Addendum)
I will call tomorrow with your test results.   If you need a prescription, I will send to [facility] on [address]

## 2021-06-23 MED ORDER — METRONIDAZOLE 500 MG PO TABS: 500.0000 mg | ORAL_TABLET | Freq: Two times a day (BID) | ORAL | 0 refills | Status: AC

## 2021-06-23 NOTE — Assessment & Plan Note (Signed)
GC and chlamydia testing done.  Await results.

## 2021-06-23 NOTE — Progress Notes (Signed)
° ° °  SUBJECTIVE:   CHIEF COMPLAINT / HPI:   STD check.  C/O vaginal irritation and discharge.  No external rash.  No fever.  No urgency frequency, dysura or abd pain.      OBJECTIVE:   BP 124/70    Pulse 94    Wt 209 lb (94.8 kg)    LMP 05/24/2021    SpO2 98%    BMI 33.73 kg/m   Abd benign. No CVA tenderness. Speculum moderate vaginal discharge  Cervix looks OK Bimanual no uterine or adnexal pain.  Specimens collected.  ASSESSMENT/PLAN:   Bacterial vaginosis Wet prep confirms BV.  Patient informed and Rx sent.   Exposure to sexually transmitted disease (STD) GC and chlamydia testing done.  Await results.     M[place] Manners, MD [facility]

## 2021-06-23 NOTE — Assessment & Plan Note (Signed)
Wet prep confirms BV.  Patient informed and Rx sent.

## 2021-06-24 LAB — CERVICOVAGINAL ANCILLARY ONLY
Chlamydia: NEGATIVE
Comment: NEGATIVE
Comment: NORMAL
Neisseria Gonorrhea: NEGATIVE

## 2021-06-29 ENCOUNTER — Encounter (HOSPITAL_COMMUNITY): Payer: Self-pay

## 2021-06-29 ENCOUNTER — Emergency Department (HOSPITAL_COMMUNITY)
Admission: EM | Admit: 2021-06-29 | Discharge: 2021-06-29 | Disposition: A | Discharge status: Home / Self Care | Payer: Medicaid Other | Attending: Emergency Medicine | Admitting: Emergency Medicine

## 2021-06-29 ENCOUNTER — Emergency Department (HOSPITAL_COMMUNITY): Payer: Medicaid Other

## 2021-06-29 ENCOUNTER — Emergency Department (HOSPITAL_COMMUNITY)
Admission: EM | Admit: 2021-06-29 | Discharge: 2021-06-29 | Disposition: A | Discharge status: Home / Self Care | Payer: Medicaid Other | Source: Home / Self Care | Attending: Emergency Medicine | Admitting: Emergency Medicine

## 2021-06-29 ENCOUNTER — Other Ambulatory Visit: Payer: Self-pay

## 2021-06-29 ENCOUNTER — Encounter (HOSPITAL_COMMUNITY): Payer: Self-pay | Admitting: Emergency Medicine

## 2021-06-29 DIAGNOSIS — W25XXXA Contact with sharp glass, initial encounter: Secondary | ICD-10-CM | POA: Insufficient documentation

## 2021-06-29 DIAGNOSIS — S61411A Laceration without foreign body of right hand, initial encounter: Secondary | ICD-10-CM | POA: Diagnosis not present

## 2021-06-29 DIAGNOSIS — Z23 Encounter for immunization: Secondary | ICD-10-CM | POA: Insufficient documentation

## 2021-06-29 DIAGNOSIS — W268XXA Contact with other sharp object(s), not elsewhere classified, initial encounter: Secondary | ICD-10-CM | POA: Diagnosis not present

## 2021-06-29 DIAGNOSIS — S6991XA Unspecified injury of right wrist, hand and finger(s), initial encounter: Secondary | ICD-10-CM | POA: Diagnosis present

## 2021-06-29 MED ORDER — TETANUS-DIPHTH-ACELL PERTUSSIS 5-2.5-18.5 LF-MCG/0.5 IM SUSY: 0.5000 mL | PREFILLED_SYRINGE | Freq: Once | INTRAMUSCULAR | Status: DC

## 2021-06-29 MED ORDER — ACETAMINOPHEN 325 MG PO TABS
650.0000 mg | ORAL_TABLET | Freq: Once | ORAL | Status: AC
  Administered 2021-06-29: 08:00:00 650 mg via ORAL
  Filled 2021-06-29: qty 2

## 2021-06-29 MED ORDER — TETANUS-DIPHTH-ACELL PERTUSSIS 5-2.5-18.5 LF-MCG/0.5 IM SUSY
0.5000 mL | PREFILLED_SYRINGE | Freq: Once | INTRAMUSCULAR | Status: AC
  Administered 2021-06-29: 08:00:00 0.5 mL via INTRAMUSCULAR
  Filled 2021-06-29: qty 0.5

## 2021-06-29 NOTE — ED Provider Notes (Signed)
[facility]-EMERGENCY DEPT Provider Note   CSN: [id number] Arrival date & time: 06/29/21  0532     History Chief Complaint  Patient presents with   Laceration    Natasha Smith is a 30 y.o. female with a past medical history as noted below who presents to the ED due to laceration to right hand.  Patient states she was cleaning out her car roughly 12 hours ago and cut the dorsal aspect of her right hand on glass.  Patient states she was able to get a piece of glass out of her right hand.  Pain worse with palpation and movement.  Unsure when her last tetanus shot was.  Hemostasis achieved prior to arrival.  She has not taken anything for pain.  Patient states she initially had numbness/tingling around laceration which has improved.  History obtained from patient and past medical records. No interpreter used during encounter.       Past Medical History:  Diagnosis Date   Abnormal Pap smear    Elevated BP without diagnosis of hypertension 10/13/2017   Headache    Herpes simplex type II infection    History of chlamydia infection    Infection    UTI   Pregnancy induced hypertension    hx   Sickle cell trait (HCC)    STD exposure 01/22/2019   Trichomoniasis 01/22/2019    Patient Active Problem List   Diagnosis Date Noted   Menstrual migraine 08/13/2020   Healthcare maintenance 08/13/2020   History of anemia 08/13/2020   Headache with neurologic deficit 07/08/2020   Allergic reaction 12/31/2019   Fall 12/07/2019   H/O tubal ligation 08/21/2019   Abnormal uterine bleeding 01/22/2019   Exposure to sexually transmitted disease (STD) 01/22/2019   Depression 11/13/2017   Bacterial vaginosis 04/05/2016   Sickle cell trait (HCC) 04/16/2014   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   OBESITY 03/03/2008   ECZEMA, ATOPIC DERMATITIS 09/07/2006    Past Surgical History:  Procedure Laterality Date   KNEE SURGERY     TUBAL LIGATION Bilateral 10/28/2017    Procedure: POST PARTUM TUBAL LIGATION;  Surgeon: Levie Heritage, DO;  Location: [facility];  Service: Gynecology;  Laterality: Bilateral;     OB History     Gravida  9   Para  5   Term  5   Preterm  0   AB  4   Living  5      SAB  3   IAB  1   Ectopic  0   Multiple  0   Live Births  5           Family History  Problem Relation Age of Onset   Diabetes Maternal Grandmother    Hypertension Mother    Asthma Mother    Heart disease Maternal Grandfather    Asthma Sister    Asthma Brother    Cancer Maternal Aunt     Social History   Tobacco Use   Smoking status: Former   Smokeless tobacco: Never   Tobacco comments:    March 2015  Substance Use Topics   Alcohol use: No   Drug use: No    Home Medications Prior to Admission medications   Medication Sig Start Date End Date Taking? Authorizing Provider  acetaminophen (TYLENOL) 500 MG tablet Take 1,500 mg by mouth every 6 (six) hours as needed for mild pain. Patient not taking: Reported on 07/08/2020    [provider]  cyclobenzaprine (FLEXERIL) 10 MG tablet Take 1 tablet (10 mg total) by mouth 2 (two) times daily as needed for muscle spasms. 08/16/20   Moshe Cipro, NP  doxycycline (VIBRAMYCIN) 100 MG capsule Take 1 capsule (100 mg total) by mouth 2 (two) times daily. Patient not taking: Reported on 07/08/2020 12/10/19   Petrucelli, Lelon Mast R, PA-C  hydrocortisone ointment 0.5 % Apply 1 application topically 2 (two) times daily. Patient not taking: Reported on 07/08/2020 12/31/19   Marthenia Rolling, DO  ibuprofen (ADVIL) 800 MG tablet Take 1 tablet (800 mg total) by mouth every 8 (eight) hours as needed for moderate pain. 08/16/20   Moshe Cipro, NP  loratadine (CLARITIN) 10 MG tablet Take 1 tablet (10 mg total) by mouth daily. Patient not taking: Reported on 07/08/2020 12/31/19   Marthenia Rolling, DO  metroNIDAZOLE (FLAGYL) 500 MG tablet Take 1 tablet (500 mg total) by mouth 2 (two)  times daily for 7 days. 06/23/21 06/30/21  Moses Manners, MD  naproxen (NAPROSYN) 500 MG tablet Take 1 tablet (500 mg total) by mouth 2 (two) times daily as needed for headache (menstrual bleeding). 08/13/20   Allayne Stack, DO  norgestimate-ethinyl estradiol (SPRINTEC 28) 0.25-35 MG-MCG tablet Take 1 tablet by mouth daily. 01/13/21   Cora Collum, DO  ondansetron (ZOFRAN ODT) 4 MG disintegrating tablet Take 1 tablet (4 mg total) by mouth every 8 (eight) hours as needed for nausea or vomiting. Patient not taking: Reported on 07/08/2020 12/10/19   Petrucelli, Pleas Koch, PA-C    Allergies    Patient has no known allergies.  Review of Systems   Review of Systems  Musculoskeletal:  Positive for arthralgias.  Skin:  Positive for wound.  Neurological:  Positive for numbness (resolved).  All other systems reviewed and are negative.  Physical Exam Updated Vital Signs BP (!) 156/107 (BP Location: Left Arm)    Pulse (!) 59    Temp (!) 97.5 F (36.4 C) (Oral)    Resp 18    Ht 5\' 4"  (1.626 m)    Wt 94.3 kg    SpO2 100%    BMI 35.70 kg/m   Physical Exam Vitals and nursing note reviewed.  Constitutional:      General: She is not in acute distress.    Appearance: She is not ill-appearing.  HENT:     Head: Normocephalic.  Eyes:     Pupils: Pupils are equal, round, and reactive to light.  Cardiovascular:     Rate and Rhythm: Normal rate and regular rhythm.     Pulses: Normal pulses.     Heart sounds: Normal heart sounds. No murmur heard.   No friction rub. No gallop.  Pulmonary:     Effort: Pulmonary effort is normal.     Breath sounds: Normal breath sounds.  Abdominal:     General: Abdomen is flat. There is no distension.     Palpations: Abdomen is soft.     Tenderness: There is no abdominal tenderness. There is no guarding or rebound.  Musculoskeletal:        General: Normal range of motion.     Cervical back: Neck supple.     Comments: Decreased range of motion of fingers 3  and 4 due to pain.  Radial pulse intact.  Soft compartments.  Skin:    General: Skin is warm and dry.     Comments: Superficial 0.5 cm laceration to dorsum of right hand.  hemostasis achieved.  Neurological:  General: No focal deficit present.     Mental Status: She is alert.  Psychiatric:        Mood and Affect: Mood normal.        Behavior: Behavior normal.    ED Results / Procedures / Treatments   Labs (all labs ordered are listed, but only abnormal results are displayed) Labs Reviewed - No data to display  EKG None  Radiology DG Hand Complete Right  Result Date: 06/29/2021 CLINICAL DATA:  Right hand laceration. EXAM: RIGHT HAND - COMPLETE 3+ VIEW COMPARISON:  None. FINDINGS: There is no evidence of fracture or dislocation. There is no evidence of arthropathy or other focal bone abnormality. Mild to moderate severity dorsal soft tissue swelling is seen within associated superficial soft tissue defect. IMPRESSION: Dorsal soft tissue swelling and associated laceration without evidence of acute fracture. Electronically Signed   By: Aram Candela M.D.   On: 06/29/2021 02:57    Procedures Procedures   Medications Ordered in ED Medications  acetaminophen (TYLENOL) tablet 650 mg (has no administration in time range)  Tdap (BOOSTRIX) injection 0.5 mL (0.5 mLs Intramuscular Given 06/29/21 0758)    ED Course  I have reviewed the triage vital signs and the nursing notes.  Pertinent labs & imaging results that were available during my care of the patient were reviewed by me and considered in my medical decision making (see chart for details).    MDM Rules/Calculators/A&P                         30 year old female presents to the ED due to superficial laceration to dorsum of right hand after cleaning out her car and cutting hand on glass.  Patient had a x-ray at [facility] prior to arrival which I personally reviewed which is negative for any foreign bodies or underlying bony  fractures.  Laceration has been open for greater than 12 hours.  Laceration is superficial.  Low suspicion for tendon involvement.  No foreign bodies appreciated on exam.  Wound soaked in Betadine and water.  Tetanus updated.  Patient given Tylenol.  No need for suture repair due to superficial nature of laceration.  Wound care discussed with patient and boyfriend at bedside. Strict ED precautions discussed with patient. Patient states understanding and agrees to plan. Patient discharged home in no acute distress and stable vitals     Final Clinical Impression(s) / ED Diagnoses Final diagnoses:  Laceration of right hand without foreign body, initial encounter    Rx / DC Orders ED Discharge Orders     None        Mannie Stabile, PA-C 06/29/21 0800    Pricilla Loveless, MD 06/29/21 [phone]

## 2021-06-29 NOTE — ED Provider Notes (Signed)
Emergency Medicine Provider Triage Evaluation Note  Natasha Smith , a 30 y.o. female  was evaluated in triage.  Pt complains of feeling like she has glass in hand. States she broke some glass in her car and feels like some got stuck in her hand while vacuuming.  She did pull some shards out but still feels like there is something in there.  Stated last tetanus unknown..  Review of Systems  Positive: Right hand wound Negative: Fever, numbness  Physical Exam  BP 138/87 (BP Location: Right Arm)    Pulse 71    Temp 98.1 F (36.7 C) (Oral)    Resp 16    SpO2 100%  Gen:   Awake, no distress   Resp:  Normal effort  MSK:   Moves extremities without difficulty  Other:  Small wound to right 3rd MCP joint area, no active bleeding, able to flex/extend  Medical Decision Making  Medically screening exam initiated at 2:02 AM.  Appropriate orders placed.  Natasha Smith was informed that the remainder of the evaluation will be completed by another provider, this initial triage assessment does not replace that evaluation, and the importance of remaining in the ED until their evaluation is complete.  Small wound without active bleeding.  Will update tetanus.  X-ray ordered.   Garlon Hatchet, PA-C 06/29/21 6286    Shon Baton, MD 06/29/21 [phone]

## 2021-06-29 NOTE — ED Triage Notes (Signed)
Pt reported to ED with c/o laceration and glass fragments on posterior right hand after cutting it while cleaning out car tonight. No bleeding noted to area and laceration appears to be small in size.

## 2021-06-29 NOTE — ED Triage Notes (Signed)
Patient presents to ED with c/o laceration on right hand. Patient reports cutting hand on class while she was cleaning car tonight. Bleeding controlled, small laceration noted on back of right hand.

## 2021-06-29 NOTE — Discharge Instructions (Addendum)
It was pleasure taking care of you today.  As discussed, keep wound clean with soap and water.  Your x-ray performed at [facility] did not show any foreign bodies or underlying broken bones.  You may take over-the-counter ibuprofen or Tylenol as needed for pain.  Please follow-up with PCP if symptoms do not improve over the next week.  Return to the ER for any worsening symptoms.

## 2021-08-11 ENCOUNTER — Ambulatory Visit: Payer: Medicaid Other | Admitting: Family Medicine

## 2021-09-03 ENCOUNTER — Encounter: Payer: Self-pay | Admitting: Family Medicine

## 2021-09-03 NOTE — Progress Notes (Signed)
Patient has no-showed to multiple appointments in a 6-month period. Per our no-show policy, a letter has been routed to [facility] Admin to be mailed to patient regarding likely dismissal for repeat no show. Will CC to PCP.  ° °

## 2021-12-14 ENCOUNTER — Encounter: Payer: Self-pay | Admitting: *Deleted

## 2022-02-14 ENCOUNTER — Encounter: Payer: Self-pay | Admitting: Family Medicine

## 2022-02-14 ENCOUNTER — Ambulatory Visit (INDEPENDENT_AMBULATORY_CARE_PROVIDER_SITE_OTHER): Payer: Medicaid Other | Admitting: Family Medicine

## 2022-02-14 ENCOUNTER — Other Ambulatory Visit (HOSPITAL_COMMUNITY)
Admission: RE | Admit: 2022-02-14 | Discharge: 2022-02-14 | Disposition: A | Discharge status: Home / Self Care | Payer: Medicaid Other | Source: Ambulatory Visit | Attending: Family Medicine | Admitting: Family Medicine

## 2022-02-14 VITALS — BP 146/100 | HR 82 | Ht 64.0 in | Wt 210.8 lb

## 2022-02-14 DIAGNOSIS — Z113 Encounter for screening for infections with a predominantly sexual mode of transmission: Secondary | ICD-10-CM | POA: Insufficient documentation

## 2022-02-14 DIAGNOSIS — R030 Elevated blood-pressure reading, without diagnosis of hypertension: Secondary | ICD-10-CM

## 2022-02-14 NOTE — Patient Instructions (Addendum)
It was great to see you!  Today we did routine STI testing (gonorrhea, chlamydia, trichomonas). I will send you a MyChart message with the results.  Dr Adrian Blackwater did your tubal back in 2019. You can call [facility] and request an appointment to discuss your periods. [phone]. They are located at [address].  Your blood pressure was elevated today. Please check your blood pressure once daily at home. Keep a written log of the numbers and bring it to your next appointment in 2-3 weeks.   Take care, Dr Anner Crete

## 2022-02-14 NOTE — Assessment & Plan Note (Signed)
>>  ASSESSMENT AND PLAN FOR ELEVATED BLOOD PRESSURE READING WITHOUT DIAGNOSIS OF HYPERTENSION WRITTEN ON 02/14/2022  5:49 PM BY Anner Crete, Starr Engel, MD  BP elevated today x2. No prior history of elevated blood pressure aside from some elevated readings at the end of her last pregnancy (2019). Advised to check BP at home and follow up in 2-3 weeks. Bring written log to f/u appt

## 2022-02-14 NOTE — Assessment & Plan Note (Signed)
BP elevated today x2. No prior history of elevated blood pressure aside from some elevated readings at the end of her last pregnancy (2019). Advised to check BP at home and follow up in 2-3 weeks. Bring written log to f/u appt

## 2022-02-14 NOTE — Progress Notes (Signed)
    SUBJECTIVE:   CHIEF COMPLAINT / HPI:   STI Testing -requests routine STI testing -no symptoms (no vaginal discharge, irritation, urinary frequency, dysuria, odor, etc) -no known exposure -sexually active with 1 female partner, same partner as previously -h/o chlamydia July 2022, treated, negative TOC Dec 2022 -s/p BTL for contraception  PERTINENT  PMH / PSH: sickle cell trait, HSV-2  OBJECTIVE:   BP (!) 146/100   Pulse 82   Ht 5\' 4"  (1.626 m)   Wt 210 lb 12.8 oz (95.6 kg)   LMP 02/10/2022 (Exact Date)   SpO2 100%   BMI 36.18 kg/m   General: NAD, pleasant, able to participate in exam Respiratory: No respiratory distress Skin: warm and dry, no rashes noted Psych: Normal affect and mood Neuro: grossly intact GU/GYN: Exam performed in the presence of a chaperone. External genitalia within normal limits.  Vaginal mucosa pink, moist, normal rugae.  Nonfriable cervix without lesions, no discharge or bleeding noted on speculum exam.   ASSESSMENT/PLAN:   Screening for STI -GC, chlamydia, and trich obtained today -Patient declined HIV and RPR testing  Elevated blood pressure reading without diagnosis of hypertension BP elevated today x2. No prior history of elevated blood pressure aside from some elevated readings at the end of her last pregnancy (2019). Advised to check BP at home and follow up in 2-3 weeks. Bring written log to f/u appt     01-16-1999, MD [facility]

## 2022-02-15 LAB — CERVICOVAGINAL ANCILLARY ONLY
Chlamydia: NEGATIVE
Comment: NEGATIVE
Comment: NORMAL
Neisseria Gonorrhea: NEGATIVE

## 2022-08-05 ENCOUNTER — Other Ambulatory Visit: Payer: Self-pay

## 2022-08-05 ENCOUNTER — Encounter: Payer: Self-pay | Admitting: Family Medicine

## 2022-08-05 ENCOUNTER — Ambulatory Visit (INDEPENDENT_AMBULATORY_CARE_PROVIDER_SITE_OTHER): Payer: Medicaid Other | Admitting: Family Medicine

## 2022-08-05 VITALS — BP 153/104 | HR 87 | Ht 64.0 in | Wt 217.6 lb

## 2022-08-05 DIAGNOSIS — Z13220 Encounter for screening for lipoid disorders: Secondary | ICD-10-CM

## 2022-08-05 DIAGNOSIS — Z131 Encounter for screening for diabetes mellitus: Secondary | ICD-10-CM | POA: Diagnosis not present

## 2022-08-05 DIAGNOSIS — I10 Essential (primary) hypertension: Secondary | ICD-10-CM

## 2022-08-05 LAB — POCT URINALYSIS DIP (MANUAL ENTRY)
Bilirubin, UA: NEGATIVE
Glucose, UA: NEGATIVE mg/dL
Ketones, POC UA: NEGATIVE mg/dL
Nitrite, UA: NEGATIVE
Protein Ur, POC: NEGATIVE mg/dL
Spec Grav, UA: 1.02 (ref 1.010–1.025)
Urobilinogen, UA: 1 E.U./dL
pH, UA: 5.5 (ref 5.0–8.0)

## 2022-08-05 LAB — POCT UA - MICROSCOPIC ONLY
Epithelial cells, urine per micros: >20
Trichomonas, UA: POSITIVE

## 2022-08-05 MED ORDER — LOSARTAN POTASSIUM-HCTZ 50-12.5 MG PO TABS: 1.0000 | ORAL_TABLET | Freq: Every day | ORAL | 0 refills | Status: DC

## 2022-08-05 NOTE — Patient Instructions (Signed)
It was great to see you!  Your blood pressure was high today.  We are going to start a medication, which I have sent to your pharmacy.  Take it once daily.  Please continue checking your blood pressure at home.  Keep a written log of the readings and bring it to your next appointment.  We are checking some blood work and urine studies.  I will send you a MyChart message with the results.  If your headache does not improve despite improvement in your blood pressure, we will discuss additional treatments at that time.  See me back in 2-3 weeks.   Take care, Dr Rock Nephew

## 2022-08-05 NOTE — Progress Notes (Signed)
    SUBJECTIVE:   CHIEF COMPLAINT / HPI:   Headaches, Discuss BP -intermittent for years, h/o migraines few times per month -current headache constant for 1 week -checked BP today at home, 159/97 -taking Excedrin -didn't have excedrin prior to checking BP -Mom, MGM with hypertension, Mom just had "mini stroke" from her BP -some nausea, no vomiting, some light sensitivity -not on menses -s/p BTL   PERTINENT  PMH / PSH: ***  OBJECTIVE:   BP (!) 153/104   Pulse 87   Ht 5\' 4"  (1.626 m)   Wt 217 lb 9.6 oz (98.7 kg)   SpO2 99%   BMI 37.35 kg/m   ***  ASSESSMENT/PLAN:   No problem-specific Assessment & Plan notes found for this encounter.     Alcus Dad, MD [place]

## 2022-08-06 LAB — BASIC METABOLIC PANEL
BUN/Creatinine Ratio: 18 (ref 9–23)
BUN: 14 mg/dL (ref 6–20)
CO2: 21 mmol/L (ref 20–29)
Calcium: 9.4 mg/dL (ref 8.7–10.2)
Chloride: 103 mmol/L (ref 96–106)
Creatinine, Ser: 0.79 mg/dL (ref 0.57–1.00)
Glucose: 80 mg/dL (ref 70–99)
Potassium: 4 mmol/L (ref 3.5–5.2)
Sodium: 138 mmol/L (ref 134–144)
eGFR: 102 mL/min/{1.73_m2} (ref >59)

## 2022-08-06 LAB — LIPID PANEL
Chol/HDL Ratio: 3.6 ratio (ref 0.0–4.4)
Cholesterol, Total: 201 mg/dL — ABNORMAL HIGH (ref 100–199)
HDL: 56 mg/dL (ref >39)
LDL Chol Calc (NIH): 126 mg/dL — ABNORMAL HIGH (ref 0–99)
Triglycerides: 106 mg/dL (ref 0–149)
VLDL Cholesterol Cal: 19 mg/dL (ref 5–40)

## 2022-08-06 LAB — HEMOGLOBIN A1C
Est. average glucose Bld gHb Est-mCnc: 105 mg/dL
Hgb A1c MFr Bld: 5.3 % (ref 4.8–5.6)

## 2022-08-06 NOTE — Assessment & Plan Note (Signed)
BP elevated today x2. On chart review was also elevated at last visit. Meets criteria for diagnosis of hypertension. Start losartan-HCTZ 50-12.5mg  daily. Check BMP today. Since this is a new diagnosis will also check basic screening labs including A1c, lipid panel, and urinalysis. Advised to monitor home BP readings. Follow up in 2 weeks, plan for repeat BMP at that time.

## 2022-08-08 ENCOUNTER — Telehealth (HOSPITAL_COMMUNITY): Payer: Self-pay | Admitting: Family Medicine

## 2022-08-08 MED ORDER — METRONIDAZOLE 500 MG PO TABS: 500.0000 mg | ORAL_TABLET | Freq: Two times a day (BID) | ORAL | 0 refills | Status: DC

## 2022-08-08 NOTE — Telephone Encounter (Signed)
Attempted to call patient again to discuss results. No answer.  She has seen MyChart message and can reach out with any concerns.   Alcus Dad, MD PGY-3, [place]

## 2022-08-08 NOTE — Addendum Note (Signed)
Addended by: Alcus Dad on: 08/08/2022 09:35 AM   Modules accepted: Orders

## 2022-08-08 NOTE — Telephone Encounter (Signed)
Attempted to contact patient to discuss results from recent visit.  No answer, left HIPAA compliant voicemail.   Urine incidentally noted to be positive for trichomonas.  Rx sent for Flagyl 500 mg BID x 7 days.  Patient sent MyChart message informing her of above.  Will attempt to call patient again later today to discuss and answer any questions.   Alcus Dad, MD PGY-3, [place]

## 2022-10-14 ENCOUNTER — Ambulatory Visit (INDEPENDENT_AMBULATORY_CARE_PROVIDER_SITE_OTHER): Payer: Medicaid Other | Admitting: Family Medicine

## 2022-10-14 VITALS — BP 137/89 | HR 91 | Ht 64.0 in | Wt 218.6 lb

## 2022-10-14 DIAGNOSIS — A599 Trichomoniasis, unspecified: Secondary | ICD-10-CM

## 2022-10-14 DIAGNOSIS — G43119 Migraine with aura, intractable, without status migrainosus: Secondary | ICD-10-CM | POA: Diagnosis present

## 2022-10-14 DIAGNOSIS — G43829 Menstrual migraine, not intractable, without status migrainosus: Secondary | ICD-10-CM

## 2022-10-14 DIAGNOSIS — M79644 Pain in right finger(s): Secondary | ICD-10-CM | POA: Diagnosis not present

## 2022-10-14 DIAGNOSIS — I10 Essential (primary) hypertension: Secondary | ICD-10-CM

## 2022-10-14 MED ORDER — METRONIDAZOLE 500 MG PO TABS: 500.0000 mg | ORAL_TABLET | Freq: Two times a day (BID) | ORAL | 0 refills | Status: DC

## 2022-10-14 MED ORDER — PROPRANOLOL HCL ER BEADS 80 MG PO CP24: 80.0000 mg | ORAL_CAPSULE | Freq: Every day | ORAL | 0 refills | Status: DC

## 2022-10-14 NOTE — Assessment & Plan Note (Signed)
Chronic frequent headaches consistent with migraines.  I do think she would benefit from a prophylactic medication so we will initiate propranolol given concomitant hypertension.

## 2022-10-14 NOTE — Progress Notes (Signed)
    SUBJECTIVE:   CHIEF COMPLAINT / HPI:  Chief Complaint  Patient presents with   Headache    Reports frequent headaches that has been going on for years. Occurring at least 4-5 times per month. Associated with dizziness, nausea, vomiting, photophobia. Reports associated numbness in feet and generalized weakness. Usually bilateral, described as pounding. Usually lasts 5-6 hours. Takes Excedrin which does provide relief. No known triggers, maybe stress. Menstrual periods sometimes make it worse. No headache currently.  Home readings: checking but unsure of the readings  Also reports that she punched a door about 3 months ago, thinks she broke her right fourth finger.  Having some pain intermittently.  Never sought evaluation for this.  Patient also noted to have trichomonas infection at last visit noted on UA.  She was prescribed metronidazole but patient reports she was never able to get the prescription from the pharmacy.  PERTINENT  PMH / PSH: HTN, menstrual migraine, obesity, anemia, depression, BTL  Patient Care Team: Elberta Fortis, MD as PCP - General (Family Medicine)   OBJECTIVE:   BP 137/89   Pulse 91   Ht 5\' 4"  (1.626 m)   Wt 218 lb 9.6 oz (99.2 kg)   LMP 09/11/2022   SpO2 100%   BMI 37.52 kg/m   Physical Exam Constitutional:      General: She is not in acute distress. Cardiovascular:     Rate and Rhythm: Normal rate and regular rhythm.  Pulmonary:     Effort: Pulmonary effort is normal. No respiratory distress.     Breath sounds: Normal breath sounds.  Musculoskeletal:     Cervical back: Neck supple.     Comments: Right fourth digit with mild flexion deformity noted at the DIP with some firm swelling at the joint.  No tenderness to palpation.  Has some limited flexion and extension at the DIP.  Neurological:     Mental Status: She is alert.     Cranial Nerves: No cranial nerve deficit.         10/14/2022    4:07 PM  Depression screen PHQ 2/9   Decreased Interest 0  PHQ - 2 Score 0     {Show previous vital signs (optional):23777}    ASSESSMENT/PLAN:   1. Intractable migraine with aura without status migrainosus Chronic frequent headaches consistent with migraines.  I do think she would benefit from a prophylactic medication so we will initiate propranolol given concomitant hypertension. - propranolol (INNOPRAN XL) 80 MG 24 hr capsule; Take 1 capsule (80 mg total) by mouth at bedtime.  Dispense: 30 capsule; Refill: 0  2. Primary hypertension Well-controlled in office, obtain metabolic panel today as she never had repeat labs after initiating losartan-HCTZ. - Basic Metabolic Panel  3. Finger pain, right Secondary to injury 3 months ago, probably has an old fracture and possible element of mallet finger will obtain imaging to further evaluate.  Probably low utility of splinting at this point. - DG Finger Ring Right; Future  4. Trichomonas infection Noted previously, was not treated yet.  Will resend antibiotics.  Advised to return for retesting after 1 month. - metroNIDAZOLE (FLAGYL) 500 MG tablet; Take 1 tablet (500 mg total) by mouth 2 (two) times daily. For 7 days. Do not drink alcohol while taking this medication.  Dispense: 14 tablet; Refill: 0   Return in about 4 weeks (around 11/11/2022) for f/u headaches.   Littie Deeds, MD [facility]

## 2022-10-14 NOTE — Assessment & Plan Note (Signed)
Well-controlled in office, obtain metabolic panel today as she never had repeat labs after initiating losartan-HCTZ.

## 2022-10-14 NOTE — Patient Instructions (Addendum)
It was nice seeing you today!  Start propranolol 80 mg once a day to help prevent headaches.  Get your x-rays done here.  You do not need an appointment. [facility] Address: [address] [place], [place], [place] [address] Phone: [phone]   Stay well, Littie Deeds, MD [facility] [phone]  --  Make sure to check out at the front desk before you leave today.  Please arrive at least 15 minutes prior to your scheduled appointments.  If you had blood work today, I will send you a MyChart message or a letter if results are normal. Otherwise, I will give you a call.  If you had a referral placed, they will call you to set up an appointment. Please give [place] a call if you don't hear back in the next 2 weeks.  If you need additional refills before your next appointment, please call your pharmacy first.

## 2022-10-15 LAB — BASIC METABOLIC PANEL
BUN/Creatinine Ratio: 17 (ref 9–23)
BUN: 12 mg/dL (ref 6–20)
CO2: 21 mmol/L (ref 20–29)
Calcium: 9.4 mg/dL (ref 8.7–10.2)
Chloride: 107 mmol/L — ABNORMAL HIGH (ref 96–106)
Creatinine, Ser: 0.71 mg/dL (ref 0.57–1.00)
Glucose: 74 mg/dL (ref 70–99)
Potassium: 3.9 mmol/L (ref 3.5–5.2)
Sodium: 140 mmol/L (ref 134–144)
eGFR: 116 mL/min/{1.73_m2} (ref >59)

## 2022-11-11 ENCOUNTER — Telehealth: Payer: Self-pay | Admitting: Family Medicine

## 2022-11-11 ENCOUNTER — Ambulatory Visit (INDEPENDENT_AMBULATORY_CARE_PROVIDER_SITE_OTHER): Payer: Medicaid Other | Admitting: Family Medicine

## 2022-11-11 VITALS — BP 132/84 | HR 76 | Wt 214.0 lb

## 2022-11-11 DIAGNOSIS — Z862 Personal history of diseases of the blood and blood-forming organs and certain disorders involving the immune mechanism: Secondary | ICD-10-CM

## 2022-11-11 DIAGNOSIS — Z8639 Personal history of other endocrine, nutritional and metabolic disease: Secondary | ICD-10-CM

## 2022-11-11 DIAGNOSIS — G43009 Migraine without aura, not intractable, without status migrainosus: Secondary | ICD-10-CM

## 2022-11-11 MED ORDER — FERROUS SULFATE 324 (65 FE) MG PO TBEC
1.0000 | DELAYED_RELEASE_TABLET | Freq: Every day | ORAL | 2 refills | Status: DC
Start: 2022-11-11 — End: 2023-07-12

## 2022-11-11 MED ORDER — PROPRANOLOL HCL 40 MG PO TABS
40.0000 mg | ORAL_TABLET | Freq: Two times a day (BID) | ORAL | 2 refills | Status: DC
Start: 2022-11-11 — End: 2023-07-10

## 2022-11-11 MED ORDER — SUMATRIPTAN SUCCINATE 50 MG PO TABS
ORAL_TABLET | ORAL | 2 refills | Status: DC
Start: 2022-11-11 — End: 2023-08-09

## 2022-11-11 MED ORDER — ONDANSETRON HCL 4 MG/2ML IJ SOLN: 4.0000 mg | Freq: Once | INTRAMUSCULAR | Status: AC

## 2022-11-11 MED ORDER — KETOROLAC TROMETHAMINE 30 MG/ML IJ SOLN
30.0000 mg | Freq: Once | INTRAMUSCULAR | Status: AC
  Administered 2022-11-11: 30 mg via INTRAMUSCULAR

## 2022-11-11 NOTE — Patient Instructions (Signed)
It was wonderful to see you today. Thank you for allowing me to be a part of your care. Below is a short summary of what we discussed at your visit today:  Migraines Today we gave you a in muscle shot of an anti-inflammatory called Toradol and an nausea medicine called Zofran.  This should help to your current migraine in the bed.  Start sumatriptan for symptomatic relief of migraines. Take 1 pill at the start of your next migraine. If the symptoms are still there 2 hours later, may take 1 more pill.  Use sumatriptan sparingly.  If you can stop a migraine with something like an NSAID (ibuprofen or Aleve) or the proper dose of Excedrin, do that.  Iron deficiency Given your history of iron deficiency, I will refill your iron supplement.  Take every day, become back down to every other day or so if you get constipated.  Today you are taking little bit of blood to check your hemoglobin level and iron levels. If the results are normal, I will send you a letter or MyChart message. If the results are abnormal, I will give you a call.      Please bring all of your medications to every appointment!  If you have any questions or concerns, please do not hesitate to contact us via phone or MyChart message.   Fayette Pho, MD

## 2022-11-11 NOTE — Assessment & Plan Note (Signed)
3-4 times per month.  Respond somewhat to Excedrin and sleep. Has migraine now.  - IM Toradol and IM Zofran here in clinic - Rx propranolol IR 40 mg twice daily - Rx sumatriptan for abortive therapy for migraines not responsive to Excedrin, Tylenol, and sleep

## 2022-11-11 NOTE — Progress Notes (Signed)
   SUBJECTIVE:   CHIEF COMPLAINT / HPI:   Headaches, body sweats Last seen in [facility] 4/05. Dx migraine, rx propanolol ER 80 mg daily Never started propranolol because she received a phone call from a provider who said to not take it (cannot recall why) Symptoms with migraines: Light sensitivity, some nausea without emesis, weak and tired feeling Occurs 3-4 times per month Significantly debilitating, unable to work through them Improved with Excedrin and sleep  Iron deficiency Patient reports a history of iron deficiency Has been out of her supplement for a year and would like a refill Amenable to blood work today  PERTINENT  PMH / PSH:  Patient Active Problem List   Diagnosis Date Noted   Migraine 08/13/2020   History of anemia 08/13/2020   Headache with neurologic deficit 07/08/2020   Allergic reaction 12/31/2019   H/O tubal ligation 08/21/2019   Abnormal uterine bleeding 01/22/2019   Exposure to sexually transmitted disease (STD) 01/22/2019   Depression 11/13/2017   Primary hypertension 10/13/2017   Bacterial vaginosis 04/05/2016   Sickle cell trait (HCC) 04/16/2014   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  Respond somewhat to Excedrin and sleep. Has migraine now.  - IM Toradol and IM Zofran here in clinic - Rx propranolol IR 40 mg twice daily - Rx sumatriptan for abortive therapy for migraines not responsive to Excedrin, Tylenol, and sleep  History of anemia Patient request refill of iron supplementation.  Will obtain CBC and iron studies today for check-in.     Fayette Pho, MD [facility]

## 2022-11-11 NOTE — Assessment & Plan Note (Signed)
Patient request refill of iron supplementation.  Will obtain CBC and iron studies today for check-in.

## 2022-11-11 NOTE — Telephone Encounter (Signed)
Called patient to discuss propranolol question from earlier.  She reports that she been previously told to not take propranolol as prescribed because of something to do with her cholesterol.  I precepted this with Dr. Deirdre Priest and write up a little bit on the subject too.  We are unaware of any correlation between propranolol and worsened cholesterol.  I have written a new prescription for her.  She has previously prescribed the propranolol XR 80 mg daily.  Given her normal blood pressure and pulse, I will likely start her at a lower dose if possible to ensure tolerability.  I have sent a prescription for propranolol IR 40 mg twice daily.  Patient did not answer phone, I had to leave voicemail.  The voicemail discusses the above in a HIPAA safe manner.   Fayette Pho, MD

## 2023-07-10 ENCOUNTER — Ambulatory Visit (INDEPENDENT_AMBULATORY_CARE_PROVIDER_SITE_OTHER): Payer: Self-pay | Admitting: Student

## 2023-07-10 VITALS — BP 143/100 | HR 91 | Ht 64.0 in | Wt 214.4 lb

## 2023-07-10 DIAGNOSIS — N939 Abnormal uterine and vaginal bleeding, unspecified: Secondary | ICD-10-CM

## 2023-07-10 DIAGNOSIS — Z8639 Personal history of other endocrine, nutritional and metabolic disease: Secondary | ICD-10-CM

## 2023-07-10 DIAGNOSIS — G43009 Migraine without aura, not intractable, without status migrainosus: Secondary | ICD-10-CM

## 2023-07-10 MED ORDER — PROPRANOLOL HCL 40 MG PO TABS: 40.0000 mg | ORAL_TABLET | Freq: Two times a day (BID) | ORAL | 2 refills | Status: DC

## 2023-07-10 MED ORDER — ACETAMINOPHEN 500 MG PO TABS: 500.0000 mg | ORAL_TABLET | Freq: Four times a day (QID) | ORAL | 0 refills | Status: AC | PRN

## 2023-07-10 MED ORDER — KETOROLAC TROMETHAMINE 30 MG/ML IJ SOLN
30.0000 mg | Freq: Once | INTRAMUSCULAR | Status: AC
Start: 2023-07-10 — End: 2023-07-10
  Administered 2023-07-10: 30 mg via INTRAMUSCULAR

## 2023-07-10 NOTE — Patient Instructions (Addendum)
It was great to see you today! Thank you for choosing [facility] for your primary care.  Today we addressed: We can do a toradol injection today  Please keep a diary of your headache, if persists into the weekend, return early next week Tylenol 650 mg as needed sent to pharmacy I will check some labs today  I have referred to [facility] as well for further assistance   If you haven't already, sign up for My Chart to have easy access to your labs results, and communication with your primary care physician. Return in 3 weeks  Please arrive 15 minutes before your appointment to ensure smooth check in process.  We appreciate your efforts in making this happen.  Thank you for allowing me to participate in your care, Alfredo Martinez, MD 07/10/2023, 2:17 PM PGY-3, [facility]

## 2023-07-10 NOTE — Assessment & Plan Note (Signed)
Differential diagnosis considered tension-type headache, migraine, and dehydration. Physical exam and clinical history most suggestive of tension-type headache with considerable potential for migraine secondarily given pertinent positives with history of migraines in the past. Opted to obtain no labs/imaging. Plan to continue workup at next follow up. Further advised Continuation with toradol for pain and Tylenol extra strength ordered as needed with headache diary and close follow up, reassured by neurological examination.

## 2023-07-10 NOTE — Progress Notes (Cosign Needed)
    SUBJECTIVE:   CHIEF COMPLAINT / HPI:   HEADACHE Headache started x1 week  Pain is described as throbbing across the forehead, nonstop  Keeps from doing:  Daily things  Medications tried: Excedrin with no help  Patient thinks cause of headache might be: Unsure   Head trauma: None  Possibly related wisdom  Previous similar headaches: Not this headaches  Taking blood thinners: No History of cancer: No  Symptoms Nose congestion stuffiness:  No Nausea vomiting: Not with this pain  Photophobia: Yes  Noise sensitivity: Yes  Double vision or loss of vision: Sees black and white spots  Fever: None  Neck Stiffness: No Trouble walking or speaking: No    Last seen by Dr. Larita Fife 11/11/2022 and was instructed to take propranolol for migraine ppx.    Menstrual Cycle Discussion:  Has two a month and heavier menstrual cycles, skips about every other month.  Ongoing for two month Lasts 7 days Changing tampons/pads feels fully saturated and needs to change every hour or so 2019 had tubal ligation  On chart review has history of chronic abnormal uterine bleeding with previous workup including a pelvic ultrasound that was unremarkable prior to 2022. Tried NSAID trial with no benefit in past, has migraines, unable to use estrogen containing medication    PERTINENT  PMH / PSH: Iron Deficiency HTN   OBJECTIVE:   BP (!) 147/107   Pulse 91   Ht 5\' 4"  (1.626 m)   Wt 214 lb 6.4 oz (97.3 kg)   LMP 06/29/2023   SpO2 100%   BMI 36.80 kg/m   General: Alert and oriented in no apparent distress Heart: Regular rate and rhythm with no murmurs appreciated Lungs: CTA bilaterally, no wheezing Abdomen: Bowel sounds present, no abdominal pain Skin: Warm and dry Extremities: No lower extremity edema Neuro: CN II: PERRL CN III, IV,VI: EOMI CV V: Normal sensation in V1, V2, V3 CVII: Symmetric smile and brow raise CN VIII: Normal hearing CN XI: 5/5 shoulder shrug CN XII: Symmetric tongue  protrusion  UE and LE strength 5/5 Normal sensation in UE and LE bilaterally  Normal gait     ASSESSMENT/PLAN:   Assessment & Plan Migraine without aura and without status migrainosus, not intractable Differential diagnosis considered tension-type headache, migraine, and dehydration. Physical exam and clinical history most suggestive of tension-type headache with considerable potential for migraine secondarily given pertinent positives with history of migraines in the past. Opted to obtain no labs/imaging. Plan to continue workup at next follow up. Further advised Continuation with toradol for pain and Tylenol extra strength ordered as needed with headache diary and close follow up, reassured by neurological examination. Abnormal uterine bleeding (AUB) Chronic, but different from previous. Unable to use estrogen and has tried NSAID trial in the past. Referral to [facility] for further discussion, possibly procedural candidate? CBC, ferritin, TSH, prolactin today, not currently bleeding.   Alfredo Martinez, MD [facility]

## 2023-07-11 LAB — TSH RFX ON ABNORMAL TO FREE T4: TSH: 0.306 u[IU]/mL — ABNORMAL LOW (ref 0.450–4.500)

## 2023-07-11 LAB — PROLACTIN: Prolactin: 3.6 ng/mL — ABNORMAL LOW (ref 4.8–33.4)

## 2023-07-11 LAB — CBC
Hematocrit: 39.4 % (ref 34.0–46.6)
Hemoglobin: 12.1 g/dL (ref 11.1–15.9)
MCH: 23.9 pg — ABNORMAL LOW (ref 26.6–33.0)
MCHC: 30.7 g/dL — ABNORMAL LOW (ref 31.5–35.7)
MCV: 78 fL — ABNORMAL LOW (ref 79–97)
Platelets: 324 10*3/uL (ref 150–450)
RBC: 5.06 x10E6/uL (ref 3.77–5.28)
RDW: 14.4 % (ref 11.7–15.4)
WBC: 9.8 10*3/uL (ref 3.4–10.8)

## 2023-07-11 LAB — FERRITIN: Ferritin: 7 ng/mL — ABNORMAL LOW (ref 15–150)

## 2023-07-11 LAB — T4F: T4,Free (Direct): 1.19 ng/dL (ref 0.82–1.77)

## 2023-07-12 ENCOUNTER — Other Ambulatory Visit: Payer: Self-pay | Admitting: Student

## 2023-07-12 MED ORDER — FERROUS SULFATE 325 (65 FE) MG PO TABS: 325.0000 mg | ORAL_TABLET | ORAL | 0 refills | Status: AC

## 2023-07-17 ENCOUNTER — Ambulatory Visit: Payer: Self-pay | Admitting: Family Medicine

## 2023-07-17 ENCOUNTER — Encounter: Payer: Self-pay | Admitting: Student

## 2023-07-17 NOTE — Progress Notes (Deleted)
    SUBJECTIVE:   CHIEF COMPLAINT / HPI:   ***  PERTINENT  PMH / PSH: ***  OBJECTIVE:   LMP 06/29/2023  ***  General: NAD, pleasant, able to participate in exam Cardiac: RRR, no murmurs. Respiratory: CTAB, normal effort, No wheezes, rales or rhonchi Abdomen: Bowel sounds present, nontender, nondistended Extremities: no edema or cyanosis. Skin: warm and dry, no rashes noted Neuro: alert, no obvious focal deficits Psych: Normal affect and mood  ASSESSMENT/PLAN:   No problem-specific Assessment & Plan notes found for this encounter.     Dr. Izetta Nap, DO [facility]    {    This will disappear when note is signed, click to select method of visit    :1}

## 2023-08-08 NOTE — Progress Notes (Addendum)
    SUBJECTIVE:   CHIEF COMPLAINT / HPI:   Hypertension: - Medications: Hyzaar 50-12.5 - Compliance: Has not taken recently - Checking BP at home: 140-150s over 100s at home.  - Denies any SOB, CP, vision changes, LE edema, medication SEs, or symptoms of hypotension  Migraine Having symptoms almost daily including photophobia, nausea and generalized weakness in addition to headache.  Prescribed propranolol and sumatriptan on last visit on 12/30, has been taking the propranolol and sumatriptan up to twice daily for symptom relief.  Reports the sumatriptan makes her sleepy and she has to go lay down in bed afterwards.  No focal weakness or vision changes.  PERTINENT  PMH / PSH: Migraine, HTN, AUB  OBJECTIVE:   BP (!) 152/96   Pulse 84   Wt 215 lb (97.5 kg)   LMP 07/26/2023   SpO2 96%   BMI 36.90 kg/m    General: NAD, pleasant, able to participate in exam Cardiac: RRR, no murmurs. Respiratory: CTAB, normal effort, No wheezes, rales or rhonchi Extremities: no edema or cyanosis. Skin: warm and dry, no rashes noted Neuro: PERRLA.  EOMI.  Cranial nerves intact.  5/5 grip strength and hip flexion bilaterally.  Normal gait. Psych: Normal affect and mood  ASSESSMENT/PLAN:   Assessment & Plan Primary hypertension 152/96 upon repeat, has not taken BP meds recently.  Will restart combo pill and follow-up with home BP log in 1 month. -Refill losartan-HCTZ 50-12.5 mg daily -BMP at follow-up -F/u in 1 month with home BP log Chronic migraine without aura without status migrainosus, not intractable Daily migrainous symptoms not relieved with propranolol and sumatriptan.  Good candidate for CGRP therapy. -Stop propranolol and sumatriptan -Start Nurtec 75 mg daily and can be used additionally 1 time per day for breakthrough migraine.  If persistently having breakthrough migraine, recommended to follow-up for dose increase   Dr. Elberta Fortis, DO [facility]

## 2023-08-09 ENCOUNTER — Encounter: Payer: Self-pay | Admitting: Family Medicine

## 2023-08-09 ENCOUNTER — Ambulatory Visit: Payer: Self-pay | Admitting: Family Medicine

## 2023-08-09 VITALS — BP 152/96 | HR 84 | Wt 215.0 lb

## 2023-08-09 DIAGNOSIS — I10 Essential (primary) hypertension: Secondary | ICD-10-CM

## 2023-08-09 DIAGNOSIS — G43709 Chronic migraine without aura, not intractable, without status migrainosus: Secondary | ICD-10-CM

## 2023-08-09 MED ORDER — UBRELVY 50 MG PO TABS: ORAL_TABLET | ORAL | 3 refills | Status: DC

## 2023-08-09 MED ORDER — LOSARTAN POTASSIUM-HCTZ 50-12.5 MG PO TABS: 1.0000 | ORAL_TABLET | Freq: Every day | ORAL | 0 refills | Status: AC

## 2023-08-09 NOTE — Assessment & Plan Note (Signed)
Daily migrainous symptoms not relieved with propranolol and sumatriptan.  Good candidate for CGRP therapy. -Stop propranolol and sumatriptan -Start Ubrelvy 50 mg daily and can be used additionally 1 time per day for breakthrough migraine.  If persistently having breakthrough migraine, recommended to follow-up for dose increase

## 2023-08-09 NOTE — Patient Instructions (Addendum)
It was wonderful to see you today! Thank you for choosing [facility].   Please bring ALL of your medications with you to every visit.   Today we talked about:  Your blood pressure is high today, please take your blood pressure pill daily and check your blood pressure at home with a goal blood pressure of under 130/80.  If it is persistently over that value I would like you to return to the clinic and be seen sooner otherwise we can follow-up in 1 month. For your migraines I would like to switch medication given the sumatriptan is making you drowsy and you are having to take it every day.  So please take the Ubrelvy 1 time daily for maintenance and you can use an additional 1 tablet/day for breakthrough migraine.  If you are consistently having breakthrough migraines please return to the clinic as we can increase your dose if needed. Please call to schedule with OBGYN:  [facility] [address] [place], [place] [address] [phone]  Please follow up in 1 month  Call the clinic at [phone] if your symptoms worsen or you have any concerns.  Please be sure to schedule follow up at the front desk before you leave today.   Elberta Fortis, DO Family Medicine

## 2023-08-09 NOTE — Assessment & Plan Note (Signed)
152/96 upon repeat, has not taken BP meds recently.  Will restart combo pill and follow-up with home BP log in 1 month. -Refill losartan-HCTZ 50-12.5 mg daily -BMP at follow-up -F/u in 1 month with home BP log

## 2023-08-21 MED ORDER — NURTEC 75 MG PO TBDP: ORAL_TABLET | ORAL | 3 refills | Status: AC

## 2023-08-29 ENCOUNTER — Other Ambulatory Visit (HOSPITAL_COMMUNITY)
Admission: RE | Admit: 2023-08-29 | Discharge: 2023-08-29 | Disposition: A | Discharge status: Home / Self Care | Payer: Self-pay | Source: Ambulatory Visit | Attending: Obstetrics & Gynecology | Admitting: Obstetrics & Gynecology

## 2023-08-29 ENCOUNTER — Ambulatory Visit (INDEPENDENT_AMBULATORY_CARE_PROVIDER_SITE_OTHER): Payer: Self-pay | Admitting: Obstetrics & Gynecology

## 2023-08-29 ENCOUNTER — Encounter: Payer: Self-pay | Admitting: Obstetrics & Gynecology

## 2023-08-29 VITALS — BP 146/107 | HR 69 | Ht 64.0 in | Wt 217.4 lb

## 2023-08-29 DIAGNOSIS — N939 Abnormal uterine and vaginal bleeding, unspecified: Secondary | ICD-10-CM

## 2023-08-29 DIAGNOSIS — N898 Other specified noninflammatory disorders of vagina: Secondary | ICD-10-CM

## 2023-08-29 MED ORDER — METRONIDAZOLE 500 MG PO TABS: 500.0000 mg | ORAL_TABLET | Freq: Two times a day (BID) | ORAL | 1 refills | Status: AC

## 2023-08-29 NOTE — Progress Notes (Signed)
    GYNECOLOGY PROGRESS NOTE  Subjective:    Patient ID: Natasha Smith, female    DOB: 1991-06-21, 33 y.o.   MRN: [id number]  HPI  Patient is a 33 y.o. single P5 here as a new patient with the issue of 1-2 very heavy periods per month for the last year. She is not anemic but has low ferritin. She has a h/o migraine with aura and cannot use estrogen products. She had a BTL and feels like her periods are more irregular since then. Prior to the BTL she used Nuvaring.  Her partner doesn't have kids and she may consider a tubal reversal in the future.  The following portions of the patient's history were reviewed and updated as appropriate: allergies, current medications, past family history, past medical history, past social history, past surgical history, and problem list.  Review of Systems Pertinent items are noted in HPI.  Pap normal 2022  Objective:   Blood pressure (!) 146/107, pulse 69, height 5\' 4"  (1.626 m), weight 217 lb 6.4 oz (98.6 kg), last menstrual period 08/23/2023, unknown if currently breastfeeding. Body mass index is 37.32 kg/m. Well nourished, well hydrated Black female, no apparent distress She is ambulating and conversing normally. EG- normal Vagina/cervix- frothy vaginal discharge (She has a h/o BV and trich in the past).   Assessment:   1. Abnormal uterine bleeding (AUB)   2. Vaginal discharge      Plan:   1. Abnormal uterine bleeding (AUB) (Primary)  - FSH/LH - Cervicovaginal ancillary only( [place])  2. Vaginal discharge - I will go ahead and treat with flagyl. If test is + for trich, then partner will need treatment also. - Cervicovaginal ancillary only( [place])

## 2023-08-29 NOTE — Progress Notes (Signed)
Pt. Presents for heavy periods since her tubal 5 years ago.

## 2023-08-30 LAB — CERVICOVAGINAL ANCILLARY ONLY
Bacterial Vaginitis (gardnerella): POSITIVE — AB
Candida Glabrata: NEGATIVE
Candida Vaginitis: NEGATIVE
Chlamydia: NEGATIVE
Comment: NEGATIVE
Comment: NEGATIVE
Comment: NEGATIVE
Comment: NEGATIVE
Comment: NEGATIVE
Comment: NORMAL
Neisseria Gonorrhea: NEGATIVE
Trichomonas: POSITIVE — AB

## 2023-08-30 LAB — FSH/LH
FSH: 8 m[IU]/mL
LH: 7.2 m[IU]/mL

## 2023-09-04 ENCOUNTER — Encounter: Payer: Self-pay | Admitting: Obstetrics & Gynecology

## 2023-09-05 ENCOUNTER — Other Ambulatory Visit: Payer: Self-pay | Admitting: Obstetrics & Gynecology

## 2023-09-05 DIAGNOSIS — N939 Abnormal uterine and vaginal bleeding, unspecified: Secondary | ICD-10-CM

## 2023-09-05 NOTE — Progress Notes (Signed)
 Pelvic ultrasound ordered for evaluation of AUB.

## 2023-09-14 ENCOUNTER — Ambulatory Visit (HOSPITAL_COMMUNITY): Admission: RE | Admit: 2023-09-14 | Payer: Self-pay | Source: Ambulatory Visit

## 2023-09-14 ENCOUNTER — Encounter (HOSPITAL_COMMUNITY): Payer: Self-pay

## 2023-09-18 ENCOUNTER — Ambulatory Visit (HOSPITAL_BASED_OUTPATIENT_CLINIC_OR_DEPARTMENT_OTHER)
Admission: RE | Admit: 2023-09-18 | Discharge: 2023-09-18 | Disposition: A | Discharge status: Home / Self Care | Payer: Self-pay | Source: Ambulatory Visit | Attending: Obstetrics & Gynecology | Admitting: Obstetrics & Gynecology

## 2023-09-18 DIAGNOSIS — N939 Abnormal uterine and vaginal bleeding, unspecified: Secondary | ICD-10-CM | POA: Insufficient documentation

## 2023-10-09 ENCOUNTER — Encounter: Payer: Self-pay | Admitting: Obstetrics & Gynecology
